# Patient Record
Sex: Male | Born: 1960 | Race: White | Hispanic: No | Smoking: Current every day smoker
Health system: Southern US, Community
[De-identification: ages and names within clinical notes are randomized; demographics above are authoritative.]

## PROBLEM LIST (undated history)

## (undated) DIAGNOSIS — E041 Nontoxic single thyroid nodule: Secondary | ICD-10-CM

## (undated) DIAGNOSIS — E559 Vitamin D deficiency, unspecified: Secondary | ICD-10-CM

## (undated) DIAGNOSIS — G4700 Insomnia, unspecified: Secondary | ICD-10-CM

## (undated) DIAGNOSIS — F419 Anxiety disorder, unspecified: Secondary | ICD-10-CM

## (undated) DIAGNOSIS — I10 Essential (primary) hypertension: Secondary | ICD-10-CM

## (undated) DIAGNOSIS — E8801 Alpha-1-antitrypsin deficiency: Secondary | ICD-10-CM

## (undated) DIAGNOSIS — Z720 Tobacco use: Secondary | ICD-10-CM

## (undated) DIAGNOSIS — F329 Major depressive disorder, single episode, unspecified: Secondary | ICD-10-CM

## (undated) DIAGNOSIS — F410 Panic disorder [episodic paroxysmal anxiety] without agoraphobia: Secondary | ICD-10-CM

## (undated) DIAGNOSIS — J449 Chronic obstructive pulmonary disease, unspecified: Secondary | ICD-10-CM

## (undated) DIAGNOSIS — M26609 Unspecified temporomandibular joint disorder, unspecified side: Secondary | ICD-10-CM

## (undated) DIAGNOSIS — E049 Nontoxic goiter, unspecified: Secondary | ICD-10-CM

## (undated) DIAGNOSIS — M545 Low back pain, unspecified: Secondary | ICD-10-CM

## (undated) DIAGNOSIS — M109 Gout, unspecified: Secondary | ICD-10-CM

## (undated) DIAGNOSIS — E039 Hypothyroidism, unspecified: Secondary | ICD-10-CM

## (undated) DIAGNOSIS — F32A Depression, unspecified: Secondary | ICD-10-CM

## (undated) DIAGNOSIS — M1990 Unspecified osteoarthritis, unspecified site: Secondary | ICD-10-CM

## (undated) DIAGNOSIS — E785 Hyperlipidemia, unspecified: Secondary | ICD-10-CM

## (undated) DIAGNOSIS — G8929 Other chronic pain: Secondary | ICD-10-CM

## (undated) HISTORY — DX: Low back pain, unspecified: M54.50

## (undated) HISTORY — DX: Essential (primary) hypertension: I10

## (undated) HISTORY — DX: Unspecified temporomandibular joint disorder, unspecified side: M26.609

## (undated) HISTORY — DX: Vitamin D deficiency, unspecified: E55.9

## (undated) HISTORY — DX: Other chronic pain: G89.29

## (undated) HISTORY — DX: Depression, unspecified: F32.A

## (undated) HISTORY — DX: Major depressive disorder, single episode, unspecified: F32.9

## (undated) HISTORY — DX: Insomnia, unspecified: G47.00

## (undated) HISTORY — DX: Gout, unspecified: M10.9

## (undated) HISTORY — DX: Nontoxic single thyroid nodule: E04.1

## (undated) HISTORY — DX: Nontoxic goiter, unspecified: E04.9

## (undated) HISTORY — DX: Chronic obstructive pulmonary disease, unspecified: J44.9

## (undated) HISTORY — DX: Hypothyroidism, unspecified: E03.9

## (undated) HISTORY — DX: Anxiety disorder, unspecified: F41.9

## (undated) HISTORY — DX: Alpha-1-antitrypsin deficiency: E88.01

## (undated) HISTORY — DX: Hyperlipidemia, unspecified: E78.5

## (undated) HISTORY — DX: Panic disorder (episodic paroxysmal anxiety): F41.0

## (undated) HISTORY — DX: Tobacco use: Z72.0

## (undated) HISTORY — DX: Low back pain: M54.5

---

## 2008-02-21 ENCOUNTER — Ambulatory Visit: Payer: Self-pay

## 2008-03-20 ENCOUNTER — Emergency Department: Payer: Self-pay | Admitting: Emergency Medicine

## 2008-04-03 ENCOUNTER — Ambulatory Visit: Payer: Self-pay | Admitting: Unknown Physician Specialty

## 2008-04-09 ENCOUNTER — Ambulatory Visit: Payer: Self-pay | Admitting: Unknown Physician Specialty

## 2011-01-06 HISTORY — PX: COLONOSCOPY: SHX174

## 2011-08-25 ENCOUNTER — Ambulatory Visit: Payer: Self-pay | Admitting: Unknown Physician Specialty

## 2013-05-11 ENCOUNTER — Ambulatory Visit: Payer: Self-pay | Admitting: Family Medicine

## 2013-05-19 ENCOUNTER — Ambulatory Visit: Payer: Self-pay | Admitting: Family Medicine

## 2013-05-25 ENCOUNTER — Encounter: Payer: Self-pay | Admitting: General Surgery

## 2013-06-19 ENCOUNTER — Ambulatory Visit: Payer: Self-pay | Admitting: General Surgery

## 2013-06-19 ENCOUNTER — Encounter: Payer: Self-pay | Admitting: General Surgery

## 2013-06-22 ENCOUNTER — Encounter: Payer: Self-pay | Admitting: *Deleted

## 2013-06-27 NOTE — Progress Notes (Signed)
This encounter was created in error - please disregard.

## 2013-11-13 ENCOUNTER — Ambulatory Visit: Payer: Self-pay | Admitting: Internal Medicine

## 2013-11-13 LAB — CBC CANCER CENTER
Basophil: 1 %
COMMENT - H1-COM1: NORMAL
COMMENT - H1-COM2: NORMAL
Eosinophil: 4 %
HCT: 55.7 % — ABNORMAL HIGH (ref 40.0–52.0)
HGB: 18.3 g/dL — ABNORMAL HIGH (ref 13.0–18.0)
Lymphocytes: 26 %
MCH: 29.5 pg (ref 26.0–34.0)
MCHC: 32.9 g/dL (ref 32.0–36.0)
MCV: 90 fL (ref 80–100)
MONOS PCT: 4 %
Platelet: 346 x10 3/mm (ref 150–440)
RBC: 6.22 10*6/uL — AB (ref 4.40–5.90)
RDW: 14.3 % (ref 11.5–14.5)
Segmented Neutrophils: 61 %
Variant Lymphocyte: 4 %
WBC: 10.8 x10 3/mm — AB (ref 3.8–10.6)

## 2013-11-13 LAB — IRON AND TIBC
IRON SATURATION: 20 %
Iron Bind.Cap.(Total): 367 ug/dL (ref 250–450)
Iron: 73 ug/dL (ref 65–175)
Unbound Iron-Bind.Cap.: 294 ug/dL

## 2013-11-21 LAB — CANCER CENTER HEMATOCRIT: HCT: 48.2 % (ref 40.0–52.0)

## 2013-12-05 ENCOUNTER — Ambulatory Visit: Payer: Self-pay | Admitting: Internal Medicine

## 2013-12-19 LAB — CANCER CENTER HEMATOCRIT: HCT: 50 % (ref 40.0–52.0)

## 2014-01-05 ENCOUNTER — Ambulatory Visit: Payer: Self-pay | Admitting: Internal Medicine

## 2014-02-13 ENCOUNTER — Ambulatory Visit: Payer: Self-pay | Admitting: Internal Medicine

## 2014-03-06 ENCOUNTER — Ambulatory Visit
Admit: 2014-03-06 | Disposition: A | Discharge status: Home / Self Care | Payer: Self-pay | Attending: Internal Medicine | Admitting: Internal Medicine

## 2014-04-06 ENCOUNTER — Ambulatory Visit
Admit: 2014-04-06 | Disposition: A | Discharge status: Home / Self Care | Payer: Self-pay | Attending: Internal Medicine | Admitting: Internal Medicine

## 2014-04-10 LAB — CANCER CENTER HEMATOCRIT: HCT: 48.1 % (ref 40.0–52.0)

## 2014-05-07 ENCOUNTER — Other Ambulatory Visit: Payer: Self-pay | Admitting: *Deleted

## 2014-05-07 ENCOUNTER — Other Ambulatory Visit: Payer: Self-pay

## 2014-05-07 DIAGNOSIS — D751 Secondary polycythemia: Secondary | ICD-10-CM

## 2014-05-08 ENCOUNTER — Ambulatory Visit: Payer: Medicaid Other

## 2014-05-08 ENCOUNTER — Inpatient Hospital Stay: Payer: Medicaid Other | Attending: Internal Medicine

## 2014-05-08 ENCOUNTER — Encounter (INDEPENDENT_AMBULATORY_CARE_PROVIDER_SITE_OTHER): Payer: Self-pay

## 2014-05-08 ENCOUNTER — Inpatient Hospital Stay (HOSPITAL_BASED_OUTPATIENT_CLINIC_OR_DEPARTMENT_OTHER): Payer: Medicaid Other | Admitting: Internal Medicine

## 2014-05-08 VITALS — BP 125/78 | HR 63 | Temp 97.3°F | Resp 18 | Ht 71.0 in | Wt 202.6 lb

## 2014-05-08 VITALS — BP 127/76 | HR 64 | Temp 97.0°F | Resp 18

## 2014-05-08 DIAGNOSIS — E039 Hypothyroidism, unspecified: Secondary | ICD-10-CM

## 2014-05-08 DIAGNOSIS — Z79899 Other long term (current) drug therapy: Secondary | ICD-10-CM | POA: Diagnosis not present

## 2014-05-08 DIAGNOSIS — E785 Hyperlipidemia, unspecified: Secondary | ICD-10-CM

## 2014-05-08 DIAGNOSIS — F418 Other specified anxiety disorders: Secondary | ICD-10-CM

## 2014-05-08 DIAGNOSIS — K219 Gastro-esophageal reflux disease without esophagitis: Secondary | ICD-10-CM

## 2014-05-08 DIAGNOSIS — D72829 Elevated white blood cell count, unspecified: Secondary | ICD-10-CM | POA: Insufficient documentation

## 2014-05-08 DIAGNOSIS — J449 Chronic obstructive pulmonary disease, unspecified: Secondary | ICD-10-CM | POA: Insufficient documentation

## 2014-05-08 DIAGNOSIS — M545 Low back pain: Secondary | ICD-10-CM | POA: Diagnosis not present

## 2014-05-08 DIAGNOSIS — G8929 Other chronic pain: Secondary | ICD-10-CM | POA: Insufficient documentation

## 2014-05-08 DIAGNOSIS — I10 Essential (primary) hypertension: Secondary | ICD-10-CM | POA: Diagnosis not present

## 2014-05-08 DIAGNOSIS — D45 Polycythemia vera: Secondary | ICD-10-CM | POA: Diagnosis not present

## 2014-05-08 DIAGNOSIS — D751 Secondary polycythemia: Secondary | ICD-10-CM

## 2014-05-08 DIAGNOSIS — F17210 Nicotine dependence, cigarettes, uncomplicated: Secondary | ICD-10-CM

## 2014-05-08 LAB — CBC WITH DIFFERENTIAL/PLATELET
BASOS ABS: 0 10*3/uL (ref 0–0.1)
Basophils Relative: 0 %
Eosinophils Absolute: 0.3 10*3/uL (ref 0–0.7)
HCT: 48.8 % (ref 40.0–52.0)
Hemoglobin: 16.3 g/dL (ref 13.0–18.0)
LYMPHS ABS: 3.4 10*3/uL (ref 1.0–3.6)
MCH: 28.2 pg (ref 26.0–34.0)
MCHC: 33.4 g/dL (ref 32.0–36.0)
MCV: 84.3 fL (ref 80.0–100.0)
Monocytes Absolute: 0.9 10*3/uL (ref 0.2–1.0)
Monocytes Relative: 7 %
NEUTROS ABS: 7.8 10*3/uL — AB (ref 1.4–6.5)
PLATELETS: 335 10*3/uL (ref 150–440)
RBC: 5.79 MIL/uL (ref 4.40–5.90)
RDW: 14.5 % (ref 11.5–14.5)
WBC: 12.4 10*3/uL — AB (ref 3.8–10.6)

## 2014-05-08 NOTE — Progress Notes (Signed)
Tyler Sox Sr. presents today for phlebotomy per MD orders. Phlebotomy procedure started at 1700 and ended at 1715 300 mls removed. Patient observed for 30 minutes after procedure without any incident. Patient tolerated procedure well. IV needle removed intact.

## 2014-05-29 NOTE — Progress Notes (Signed)
[place]  [phone] [phone]     ID: Tyler Sox Sr. OB: 05-24-1960  MR#: [id number]  TDV#:[id number]  Patient Care Team: Arnetha Courser, MD as PCP - General (Family Medicine)  CHIEF COMPLAINT/DIAGNOSIS:  Persistent Erythrocytosis - likely secondary to history of smoking and COPD Leukocytosis - likely also secondary to smoking history.  Workup done on 11/13/13 -   WBC 10800, 61% neutrophils, 26% lymphs, platelets 346, Hb 18.3, Hct 55.7. Carboxyhemoglobin 5.4%. Serum erythropoietin 9.0.  LAP score 99.  Otherwise BCR-ABL study, peripheral blood flow cytometry, iron study all unremarkable.  Started phlebotomy on 11/15/13.  HISTORY OF PRESENT ILLNESS:  patient returns for continued hematology follow-up, he was seen few months ago in nov 2015. Hematocrit on November 9 was significantly elevated at 55.7% and he got phlebotomy done, since then it is under better control. He has chronic dyspnea on exertion which is mild and chronic cough from COPD and smoking. He currently denies any wheezing, dyspnea at rest, orthopnea, or PND. No angina or palpitation. Denies any facial flushing or recurrent/severe headaches. Denies any prior history of thromboembolic phenomena including deep venous thrombosis, pulmonary embolism, TIA, stroke, or heart attack. No fevers or night sweats.   REVIEW OF SYSTEMS:   ROS As in HPI above. In addition, no fever, chills or sweats. No new headaches or focal weakness.  No new mood disturbances. No  sore throat, cough, shortness of breath, sputum, hemoptysis or chest pain. No dizziness or palpitation. No abdominal pain, constipation, diarrhea, dysuria or hematuria. No new skin rash or bleeding symptoms. No new paresthesias in extremities.  Otherwise, a complete review of systems is negative.  PAST MEDICAL HISTORY:  Past Medical History/Past Surgical History -  Hypertension  Hyperlipidemia  COPD  Alpha-1 antitrypsin  deficiency  Chronic low back pain  Gout  Hypothyroidism  Goiter, 2 cysts on the thyroid  GERD  Depression/anxiety disorder  Temporomandibular joint disorder  History of colon polyps (polypectomy 1 in 2010, repeat colonoscopy 2013 reportedly with no polyps)   PAST SURGICAL HISTORY: as above  FAMILY HISTORY - noncontributory, denies hematologic disorders or malignancy.   SOCIAL HISTORY: History  Substance Use Topics  . Smoking status: Never Smoker   . Smokeless tobacco: Never Used  . Alcohol Use: No  Chronic smoker, 1 pack per day 39 years. Denies alcohol or recreational drug usage. Physically active and ambulatory.   Allergies  Allergen Reactions  . No Known Allergies     Current Outpatient Prescriptions  Medication Sig Dispense Refill  . clonazePAM (KLONOPIN) 2 MG tablet Take 2 mg by mouth daily.    Marland Kitchen lisinopril (PRINIVIL,ZESTRIL) 20 MG tablet Take 20 mg by mouth daily.    Marland Kitchen PARoxetine (PAXIL) 20 MG tablet Take 20 mg by mouth 2 (two) times daily.    . simvastatin (ZOCOR) 20 MG tablet Take 20 mg by mouth daily.     No current facility-administered medications for this visit.    OBJECTIVE: Filed Vitals:   05/08/14 1528  BP: 125/78  Pulse: 63  Temp: 97.3 F (36.3 C)  Resp: 18     Body mass index is 28.27 kg/(m^2).     GENERAL: Alert and oriented and in no acute distress. There is no icterus. HEENT: EOMs intact. No cervical lymphadenopathy. CVS: S1S2, regular LUNGS: Bilaterally clear to auscultation, no rhonchi. ABDOMEN: Soft, nontender. No hepatosplenomegaly clinically.  NEURO: grossly nonfocal, cranial nerves are intact. Gait unremarkable. EXTREMITIES: No pedal edema.  LAB RESULTS: Lab  Results  Component Value Date   WBC 12.4* 05/08/2014   NEUTROABS 7.8* 05/08/2014   HGB 16.3 05/08/2014   HCT 48.8 05/08/2014   MCV 84.3 05/08/2014   PLT 335 05/08/2014   10/12/13 - WBC 11300, 68% neutrophils, ANC 7700, 23% lymphocytes, ALC 2600, 6% monos, AMC 600, 2%  eosinophils, 1% basophils, Hb 17.1, hematocrit 50.3%, platelets 307, Cr 1.11, LFT unremarkable.  07/03/13 - WBC elevated at 10900, Hct 52.3%, Hb 17.8, platelets 375, ANC 7400. In May 2015 - WBC 9900 with unremarkable differential,Hct elevated at 51.4%, Hb 17.4, platelets 344.  ASSESSMENT / PLAN:   Persistent unexplained Leukocytosis, Erythrocytosis    (labs done on 10/12/13 showed WBC 11300, 68% neutrophils, ANC 7700, 23% lymphocytes, ALC 2600, 6% monos, AMC 600, 2% eosinophils, 1% basophils, Hb 17.1, hematocrit 50.3%, platelets 307, Cr 1.11, LFT unremarkable. Prior to this on 07/03/13, WBC elevated at 10900, Hct 52.3%, Hb 17.8, platelets 375, ANC 7400. In May 2015, WBC 9900 with unremarkable differential,Hct elevated at 51.4%, Hb 17.4, platelets 344)    -   Reviewed labs form today and d/w patient. He is clinically doing study, hematocrit today is better at 48.8%. Patient explained about likely etiology for elevated blood counts being from ongoing chronic smoking history, along with underlying COPD which could contribute to causing erythrocytosis. Plan is to continue to monitor hematocrit once every 4 weeks and pursue phlebotomy 300 mL if it is 48 or higher. Patient strongly advised to completely quit smoking and explained various risks and complications associated with smoking, states that he will try to do so on his own and does not want to try patches or medication at this time, have given smoking cessation information and advised him to call [facility] also. Will get CBC/differential at 12 weeks to monitor leukocytosis. Next MD follow-up at 24 weeks with repeat labs and make further plan of management.     In between visits, the patient has been advised to call or come to the ER in case of fevers, chills, bleeding, acute sickness, or new symptoms. Patient is agreeable to this plan.   Leia Alf, MD   05/29/2014 11:14 PM

## 2014-06-05 ENCOUNTER — Inpatient Hospital Stay: Payer: Medicaid Other

## 2014-06-07 ENCOUNTER — Other Ambulatory Visit: Payer: Self-pay | Admitting: Family Medicine

## 2014-06-07 DIAGNOSIS — E0590 Thyrotoxicosis, unspecified without thyrotoxic crisis or storm: Secondary | ICD-10-CM

## 2014-06-12 ENCOUNTER — Inpatient Hospital Stay: Payer: Medicaid Other

## 2014-06-12 ENCOUNTER — Inpatient Hospital Stay: Payer: Medicaid Other | Attending: Internal Medicine

## 2014-06-12 ENCOUNTER — Encounter (INDEPENDENT_AMBULATORY_CARE_PROVIDER_SITE_OTHER): Payer: Self-pay

## 2014-06-12 VITALS — BP 130/80 | HR 68 | Temp 97.0°F | Resp 18

## 2014-06-12 DIAGNOSIS — F17210 Nicotine dependence, cigarettes, uncomplicated: Secondary | ICD-10-CM | POA: Insufficient documentation

## 2014-06-12 DIAGNOSIS — D751 Secondary polycythemia: Secondary | ICD-10-CM

## 2014-06-12 DIAGNOSIS — J449 Chronic obstructive pulmonary disease, unspecified: Secondary | ICD-10-CM | POA: Diagnosis not present

## 2014-06-12 LAB — HEMATOCRIT: HEMATOCRIT: 49 % (ref 40.0–52.0)

## 2014-06-14 ENCOUNTER — Other Ambulatory Visit: Payer: Self-pay

## 2014-06-14 ENCOUNTER — Ambulatory Visit
Admission: RE | Admit: 2014-06-14 | Discharge: 2014-06-14 | Disposition: A | Discharge status: Home / Self Care | Payer: Medicaid Other | Source: Ambulatory Visit | Attending: Family Medicine | Admitting: Family Medicine

## 2014-06-14 ENCOUNTER — Encounter: Payer: Self-pay | Admitting: Family Medicine

## 2014-06-14 DIAGNOSIS — R221 Localized swelling, mass and lump, neck: Secondary | ICD-10-CM

## 2014-06-14 DIAGNOSIS — R591 Generalized enlarged lymph nodes: Secondary | ICD-10-CM | POA: Diagnosis not present

## 2014-06-14 DIAGNOSIS — E0590 Thyrotoxicosis, unspecified without thyrotoxic crisis or storm: Secondary | ICD-10-CM

## 2014-06-14 MED ORDER — SIMVASTATIN 20 MG PO TABS: 20.0000 mg | ORAL_TABLET | Freq: Every day | ORAL | Status: DC

## 2014-06-14 NOTE — Patient Instructions (Signed)
I talked with patient by phone about scan results; mass left side neck, nonspecific; options include FNA or f/u US; let's get FNA; no thyroid findings but do continue medicine; referral to ENT entered

## 2014-07-24 ENCOUNTER — Inpatient Hospital Stay: Payer: Medicaid Other

## 2014-07-24 ENCOUNTER — Inpatient Hospital Stay: Payer: Medicaid Other | Attending: Internal Medicine

## 2014-07-24 DIAGNOSIS — D751 Secondary polycythemia: Secondary | ICD-10-CM | POA: Diagnosis not present

## 2014-07-24 LAB — CBC WITH DIFFERENTIAL/PLATELET
BASOS ABS: 0 10*3/uL (ref 0–0.1)
Basophils Relative: 0 %
Eosinophils Absolute: 0.4 10*3/uL (ref 0–0.7)
Eosinophils Relative: 3 %
HEMATOCRIT: 48.6 % (ref 40.0–52.0)
HEMOGLOBIN: 15.8 g/dL (ref 13.0–18.0)
Lymphocytes Relative: 30 %
Lymphs Abs: 3.2 10*3/uL (ref 1.0–3.6)
MCH: 27 pg (ref 26.0–34.0)
MCHC: 32.5 g/dL (ref 32.0–36.0)
MCV: 83 fL (ref 80.0–100.0)
MONOS PCT: 7 %
Monocytes Absolute: 0.8 10*3/uL (ref 0.2–1.0)
NEUTROS ABS: 6.3 10*3/uL (ref 1.4–6.5)
NEUTROS PCT: 60 %
PLATELETS: 377 10*3/uL (ref 150–440)
RBC: 5.85 MIL/uL (ref 4.40–5.90)
RDW: 14.9 % — AB (ref 11.5–14.5)
WBC: 10.6 10*3/uL (ref 3.8–10.6)

## 2014-08-21 ENCOUNTER — Inpatient Hospital Stay: Payer: Medicaid Other

## 2014-08-21 ENCOUNTER — Inpatient Hospital Stay: Payer: Medicaid Other | Attending: Internal Medicine

## 2014-08-21 VITALS — BP 136/79 | HR 60 | Temp 97.4°F

## 2014-08-21 DIAGNOSIS — F17210 Nicotine dependence, cigarettes, uncomplicated: Secondary | ICD-10-CM | POA: Insufficient documentation

## 2014-08-21 DIAGNOSIS — D72829 Elevated white blood cell count, unspecified: Secondary | ICD-10-CM | POA: Diagnosis not present

## 2014-08-21 DIAGNOSIS — D751 Secondary polycythemia: Secondary | ICD-10-CM | POA: Diagnosis present

## 2014-08-21 DIAGNOSIS — J449 Chronic obstructive pulmonary disease, unspecified: Secondary | ICD-10-CM | POA: Diagnosis not present

## 2014-08-21 LAB — HEMATOCRIT: HCT: 51.5 % (ref 40.0–52.0)

## 2014-08-24 ENCOUNTER — Telehealth: Payer: Self-pay | Admitting: Gastroenterology

## 2014-08-24 ENCOUNTER — Ambulatory Visit (INDEPENDENT_AMBULATORY_CARE_PROVIDER_SITE_OTHER): Payer: Medicaid Other | Admitting: Family Medicine

## 2014-08-24 ENCOUNTER — Encounter: Payer: Self-pay | Admitting: Family Medicine

## 2014-08-24 VITALS — BP 136/79 | HR 70 | Temp 98.2°F | Ht 69.0 in | Wt 201.0 lb

## 2014-08-24 DIAGNOSIS — E559 Vitamin D deficiency, unspecified: Secondary | ICD-10-CM | POA: Insufficient documentation

## 2014-08-24 DIAGNOSIS — E8801 Alpha-1-antitrypsin deficiency: Secondary | ICD-10-CM | POA: Insufficient documentation

## 2014-08-24 DIAGNOSIS — F410 Panic disorder [episodic paroxysmal anxiety] without agoraphobia: Secondary | ICD-10-CM | POA: Diagnosis not present

## 2014-08-24 DIAGNOSIS — E038 Other specified hypothyroidism: Secondary | ICD-10-CM

## 2014-08-24 DIAGNOSIS — K921 Melena: Secondary | ICD-10-CM | POA: Diagnosis not present

## 2014-08-24 DIAGNOSIS — G4700 Insomnia, unspecified: Secondary | ICD-10-CM | POA: Diagnosis not present

## 2014-08-24 DIAGNOSIS — F319 Bipolar disorder, unspecified: Secondary | ICD-10-CM | POA: Insufficient documentation

## 2014-08-24 DIAGNOSIS — F4001 Agoraphobia with panic disorder: Secondary | ICD-10-CM

## 2014-08-24 DIAGNOSIS — E785 Hyperlipidemia, unspecified: Secondary | ICD-10-CM

## 2014-08-24 DIAGNOSIS — F3132 Bipolar disorder, current episode depressed, moderate: Secondary | ICD-10-CM

## 2014-08-24 DIAGNOSIS — E041 Nontoxic single thyroid nodule: Secondary | ICD-10-CM | POA: Insufficient documentation

## 2014-08-24 DIAGNOSIS — F419 Anxiety disorder, unspecified: Secondary | ICD-10-CM | POA: Diagnosis not present

## 2014-08-24 DIAGNOSIS — Z720 Tobacco use: Secondary | ICD-10-CM | POA: Diagnosis not present

## 2014-08-24 DIAGNOSIS — R0683 Snoring: Secondary | ICD-10-CM | POA: Insufficient documentation

## 2014-08-24 DIAGNOSIS — I10 Essential (primary) hypertension: Secondary | ICD-10-CM | POA: Insufficient documentation

## 2014-08-24 DIAGNOSIS — D751 Secondary polycythemia: Secondary | ICD-10-CM | POA: Diagnosis not present

## 2014-08-24 DIAGNOSIS — E039 Hypothyroidism, unspecified: Secondary | ICD-10-CM | POA: Insufficient documentation

## 2014-08-24 MED ORDER — PAROXETINE HCL 20 MG PO TABS: 20.0000 mg | ORAL_TABLET | Freq: Two times a day (BID) | ORAL | Status: DC

## 2014-08-24 MED ORDER — CLONAZEPAM 0.5 MG PO TABS: 0.5000 mg | ORAL_TABLET | Freq: Every day | ORAL | Status: DC

## 2014-08-24 NOTE — Telephone Encounter (Signed)
Patient called back. He has decided he does not want to make an appointment at this time. He states he has had two colonoscopies and that the blood in his stool is just hemorrhoids and he is fine at this time. If it becomes bothersome he will call back to schedule an appointment with Dr Allen Norris.

## 2014-08-24 NOTE — Assessment & Plan Note (Signed)
Not seeing a counselor; refer to psychiatrist; continue ssri; I will not continue the benzo

## 2014-08-24 NOTE — Assessment & Plan Note (Addendum)
Continue statin; limit eggs to no more than 3 per week and try to cut down on fatty processed meats; last lipid panel was May; HDL 30; encouraged activity, walking more; smoking cessation will help raise HDL

## 2014-08-24 NOTE — Assessment & Plan Note (Signed)
Refer to psychiatrist for this as well; explained that I am worried about him mixing benzo plus hydrocodone, risk of unintentional overdose

## 2014-08-24 NOTE — Assessment & Plan Note (Signed)
Followed by hematologist; will get sleep study to make sure that OSA is not contributing

## 2014-08-24 NOTE — Assessment & Plan Note (Signed)
Last TSH in [facility]

## 2014-08-24 NOTE — Assessment & Plan Note (Signed)
He is not quite ready to quit, but I am here to help when he is ready; cold [place] is how he will quit he says

## 2014-08-24 NOTE — Assessment & Plan Note (Addendum)
Refer back to psychiatrist for evaluation, diagnosis; patient does not think he has bipolar disorder but has seen psychiatrists for years; will refer to them for evaluation and medical management

## 2014-08-24 NOTE — Telephone Encounter (Signed)
I have called pt to make an appointment per referral--blood in stool. Pt is not available. I have left a message with a male for pt to call back. Will follow up.

## 2014-08-24 NOTE — Patient Instructions (Signed)
Please only take the medicines exactly as prescribed Never mix pain medicines with anxiety medicines in the future (because I care and don't want anything to happen to you) We'll refer you to a new psychiatrist We'll have you see the colon doctor to see if colonoscopy indicated Limit eggs to no more than 3 per week and cut down on fatty processed food Do try to walk more and get more activity, build up gradually and slowly We'll have you get a sleep study Really think about giving up those cigarettes

## 2014-08-24 NOTE — Progress Notes (Signed)
BP 136/79 mmHg  Pulse 70  Temp(Src) 98.2 F (36.8 C)  Ht 5\' 9"  (1.753 m)  Wt 201 lb (91.173 kg)  BMI 29.67 kg/m2  SpO2 98%   Subjective:    Patient ID: Tyler Sox Sr., male    DOB: October 21, 1960, 54 y.o.   MRN: [id number]  HPI: Tyler Cobb. is a 54 y.o. male  Chief Complaint  Patient presents with  . Hypertension  . Hyperlipidemia  . Anxiety   He says he says he needs to see a psychiatrist; a little depression about sleep, anxiety; anxiety is pretty bad; he has been taking two of his clonazepam at night (he is only instructed to take one at night), so he says he's been cheating; it helps him sleep; it does not make him feel drunk or loopy; it makes him feel better during the day about things; ran out completely about 5-6 days ago; feeling a little jittery, kind of irritable, snappy; prescription filled on 08/02/2014; no SI, no HI; needs refill of paxil He has a controlled substance contract with [place]; I checked the [facility] and he had hydrocodone filled from a dentist in June; he says he forgot to call me; dental abscess, got tooth pulled and headaches have gone away; he did not realize there would be a problem; he does not have any more hydrocodone left over  He has thick blood, last was a 51; he read that sleep apnea could be causing that; he [facility] the doctor [facility], was at hematologist two days ago; they are pulling blood off occasionally, 350 ml once a month or so; the doctor told him about getting a chest xray  He has high blood pressure; fair controlled; he does check his BP at home but not sure if accurate; top number 130s and 140s; bottom is okay  Hypothyroidism; primary; he is taking thyroid medicine daily; last labs were just in May and all reviewed  High cholesterol; on statin; eats about 6 eggs a week; eats processed fatty meats; not much cheese; no milk; last HDL was 30 in May  Smoking; 1 ppd; thinks about quitting sometimes, thinks he'll have  to quit cold [place]; not yet ready to give me a date to quit, just thinking about it  Relevant past medical, surgical, family and social history reviewed and updated as indicated. Interim medical history since our last visit reviewed. Allergies and medications reviewed and updated.  Review of Systems  Constitutional: Negative for fever and unexpected weight change.  HENT: Negative for dental problem (teeth okay after abscess and tooth extraction in June).   Respiratory: Positive for wheezing (does wheeze sometimes, comes from smoking).   Cardiovascular: Negative for leg swelling.  Gastrointestinal: Positive for blood in stool (this morning, thinks it is coming from sitting on concrete).  Hematological: Does not bruise/bleed easily.  Psychiatric/Behavioral: Positive for confusion (when he types of computer, has trouble staying on what he's doing), sleep disturbance, dysphoric mood and decreased concentration. Negative for suicidal ideas, hallucinations and self-injury. The patient is nervous/anxious.    Per HPI unless specifically indicated above     Objective:    BP 136/79 mmHg  Pulse 70  Temp(Src) 98.2 F (36.8 C)  Ht 5\' 9"  (1.753 m)  Wt 201 lb (91.173 kg)  BMI 29.67 kg/m2  SpO2 98%  Wt Readings from Last 3 Encounters:  08/24/14 201 lb (91.173 kg)  05/08/14 202 lb 9.6 oz (91.9 kg)  04/10/14 197 lb 1.5  oz (89.4 kg)    Physical Exam  Constitutional: He appears well-developed and well-nourished. No distress.  HENT:  Head: Normocephalic and atraumatic.  Eyes: EOM are normal. No scleral icterus.  Neck: No thyromegaly present.  Cardiovascular: Normal rate and regular rhythm.   Pulmonary/Chest: Effort normal and breath sounds normal.  Abdominal: Soft. Bowel sounds are normal. He exhibits no distension.  Musculoskeletal: He exhibits no edema.  Neurological: Coordination normal.  Skin: Skin is warm and dry. No pallor.  Psychiatric: Judgment and thought content normal. His mood  appears anxious. His affect is blunt. His affect is not angry, not labile and not inappropriate. His speech is not delayed, not tangential and not slurred. He is slowed and withdrawn. He is not agitated, not aggressive, not hyperactive and not combative. Thought content is not paranoid and not delusional. Cognition and memory are normal. Cognition and memory are not impaired. He does not express impulsivity or inappropriate judgment. He exhibits a depressed mood. He expresses no homicidal and no suicidal ideation.    Results for orders placed or performed in visit on 08/21/14  Hematocrit [facility])  Result Value Ref Range   HCT 51.5 40.0 - 52.0 %      Assessment & Plan:   Problem List Items Addressed This Visit      Endocrine   Hypothyroidism    Last TSH in [facility]      Relevant Medications   levothyroxine (SYNTHROID, LEVOTHROID) 100 MCG tablet     Other   Secondary erythrocytosis    Followed by hematologist; will get sleep study to make sure that OSA is not contributing      Relevant Orders   Polysomnography 4 or more parameters   Tobacco use    He is not quite ready to quit, but I am here to help when he is ready; cold [place] is how he will quit he says      Hyperlipidemia - Primary    Continue statin; limit eggs to no more than 3 per week and try to cut down on fatty processed meats; last lipid panel was May; HDL 30; encouraged activity, walking more; smoking cessation will help raise HDL      Relevant Medications   lisinopril-hydrochlorothiazide (PRINZIDE,ZESTORETIC) 20-12.5 MG per tablet   Anxiety    Refer to psychiatrist for this as well; explained that I am worried about him mixing benzo plus hydrocodone, risk of unintentional overdose      Relevant Medications   PARoxetine (PAXIL) 20 MG tablet   Other Relevant Orders   Ambulatory referral to Psychiatry   Bipolar disorder    Refer back to psychiatrist for evaluation, diagnosis; patient does not think he has  bipolar disorder but has seen psychiatrists for years; will refer to them for evaluation and medical management      Relevant Orders   Ambulatory referral to Psychiatry   Insomnia    Patient declined offer for trazodone;he increased his clonazepam, and I explained my concern; risk of unintentional overdose; he did take hydrocodone, but I do believe it was an honest mistake and was not from doctor shopping or trying to get high; explained so important to not mix medicine and he agrees to this in the future; he has chronic anxiety and insomnia, and he sounds like he is not going to sleep without something and is irritable and edgy; will give limited benzo, he promises he has no more pain medicine and will take exactly as prescribed; I will turn over  the decision as to whether or not to continue benzo to psychiatrist      Relevant Orders   Polysomnography 4 or more parameters   Panic attacks    Refer to psychiatrist      Relevant Medications   PARoxetine (PAXIL) 20 MG tablet   Blood in the stool    Refer to GI for consideration of colonoscopy; patient thinks hemorrhoids; last colonoscopy was 3 years ago      Relevant Orders   Ambulatory referral to Gastroenterology   Agoraphobia with panic attacks    Not seeing a counselor; refer to psychiatrist; continue ssri; I will not continue the benzo      Snoring    Will get sleep study to evaluate for OSA          Follow up plan: Return in about 3 months (around 11/24/2014) for multiple issues.

## 2014-08-24 NOTE — Assessment & Plan Note (Signed)
Refer to psychiatrist 

## 2014-08-24 NOTE — Assessment & Plan Note (Signed)
Will get sleep study to evaluate for OSA

## 2014-08-24 NOTE — Assessment & Plan Note (Signed)
Refer to GI for consideration of colonoscopy; patient thinks hemorrhoids; last colonoscopy was 3 years ago

## 2014-08-24 NOTE — Assessment & Plan Note (Addendum)
Patient declined offer for trazodone;he increased his clonazepam, and I explained my concern; risk of unintentional overdose; he did take hydrocodone, but I do believe it was an honest mistake and was not from doctor shopping or trying to get high; explained so important to not mix medicine and he agrees to this in the future; he has chronic anxiety and insomnia, and he sounds like he is not going to sleep without something and is irritable and edgy; will give limited benzo, he promises he has no more pain medicine and will take exactly as prescribed; I will turn over the decision as to whether or not to continue benzo to psychiatrist

## 2014-09-18 ENCOUNTER — Inpatient Hospital Stay: Payer: Medicaid Other

## 2014-09-18 ENCOUNTER — Inpatient Hospital Stay: Payer: Medicaid Other | Attending: Internal Medicine

## 2014-09-18 DIAGNOSIS — D751 Secondary polycythemia: Secondary | ICD-10-CM

## 2014-09-18 DIAGNOSIS — J449 Chronic obstructive pulmonary disease, unspecified: Secondary | ICD-10-CM | POA: Diagnosis not present

## 2014-09-18 DIAGNOSIS — F17210 Nicotine dependence, cigarettes, uncomplicated: Secondary | ICD-10-CM | POA: Insufficient documentation

## 2014-09-18 DIAGNOSIS — D72829 Elevated white blood cell count, unspecified: Secondary | ICD-10-CM | POA: Insufficient documentation

## 2014-09-18 LAB — HEMATOCRIT: HEMATOCRIT: 48.7 % (ref 40.0–52.0)

## 2014-09-19 ENCOUNTER — Other Ambulatory Visit: Payer: Self-pay | Admitting: Family Medicine

## 2014-09-19 NOTE — Telephone Encounter (Signed)
CMP from Jun 01, 2014 reviewed; Rx approved

## 2014-09-27 ENCOUNTER — Ambulatory Visit: Payer: Medicaid Other | Admitting: Gastroenterology

## 2014-10-08 ENCOUNTER — Other Ambulatory Visit: Payer: Self-pay | Admitting: Family Medicine

## 2014-10-08 NOTE — Telephone Encounter (Signed)
TSH 1.810 in May 2016 (old EMR) Last SGPT and lipids reviewed from May 2016 Rxs approved

## 2014-10-08 NOTE — Telephone Encounter (Signed)
TSH and lipids and sgpt reviewed in [facility] Rxs approved

## 2014-10-09 ENCOUNTER — Other Ambulatory Visit: Payer: Self-pay | Admitting: Family Medicine

## 2014-10-09 NOTE — Telephone Encounter (Signed)
I just approved refills yesterday; please resolve with pharmacy

## 2014-10-10 NOTE — Telephone Encounter (Signed)
It looks like I approved plenty just on October 3rd; please resolve with pharmacy

## 2014-10-10 NOTE — Telephone Encounter (Signed)
Routing to provider  

## 2014-10-16 ENCOUNTER — Inpatient Hospital Stay: Payer: Medicaid Other

## 2014-10-16 ENCOUNTER — Inpatient Hospital Stay: Payer: Medicaid Other | Attending: Internal Medicine

## 2014-10-16 DIAGNOSIS — D751 Secondary polycythemia: Secondary | ICD-10-CM | POA: Diagnosis present

## 2014-10-16 DIAGNOSIS — J449 Chronic obstructive pulmonary disease, unspecified: Secondary | ICD-10-CM | POA: Diagnosis not present

## 2014-10-16 DIAGNOSIS — D72829 Elevated white blood cell count, unspecified: Secondary | ICD-10-CM | POA: Insufficient documentation

## 2014-10-16 DIAGNOSIS — F17210 Nicotine dependence, cigarettes, uncomplicated: Secondary | ICD-10-CM | POA: Insufficient documentation

## 2014-10-16 LAB — HEMATOCRIT: HEMATOCRIT: 51.3 % (ref 40.0–52.0)

## 2014-11-07 IMAGING — CR DG CHEST 2V
1 series · 3 of 3 positions shown · non-contrast
Comparison: None.

CLINICAL DATA: Smoker, sweating, possible mass

EXAM:
CHEST  2 VIEW

[Series 1: lat · 0.17mm/px · 3 of 3 slices shown]
[im 1/3]
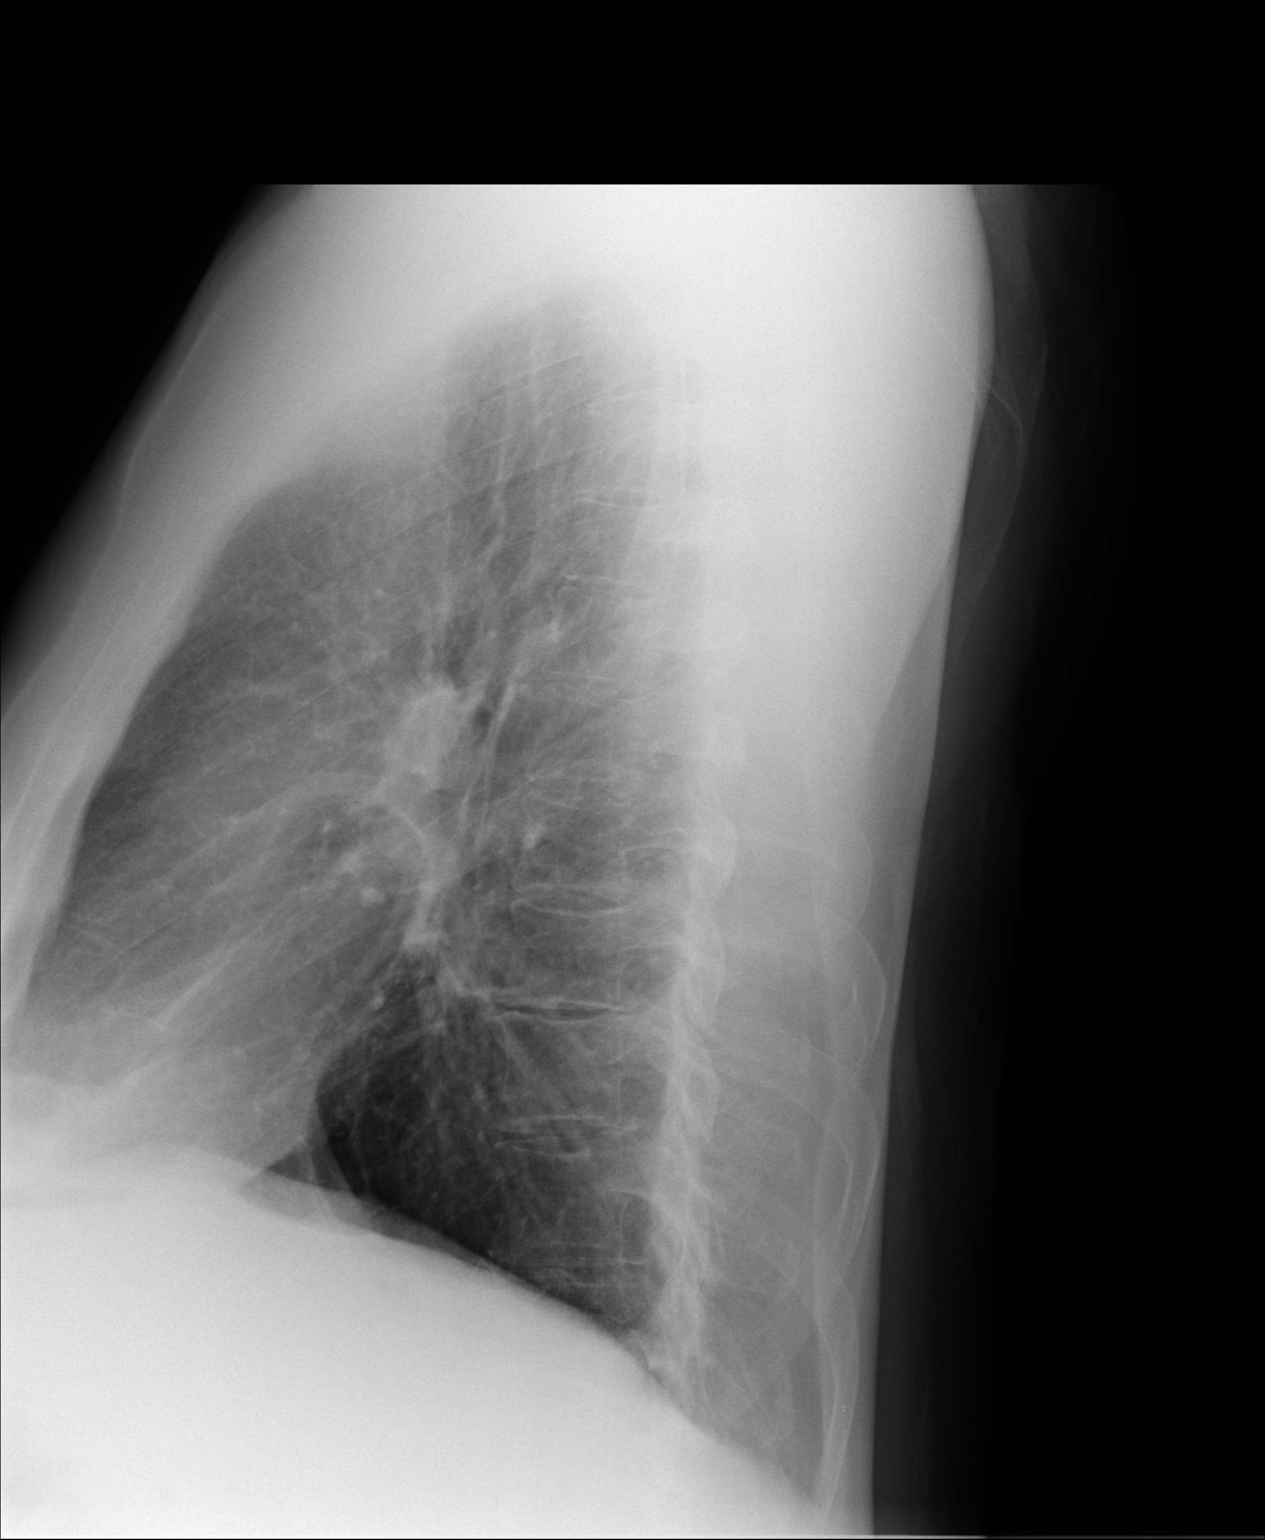
[im 2/3]
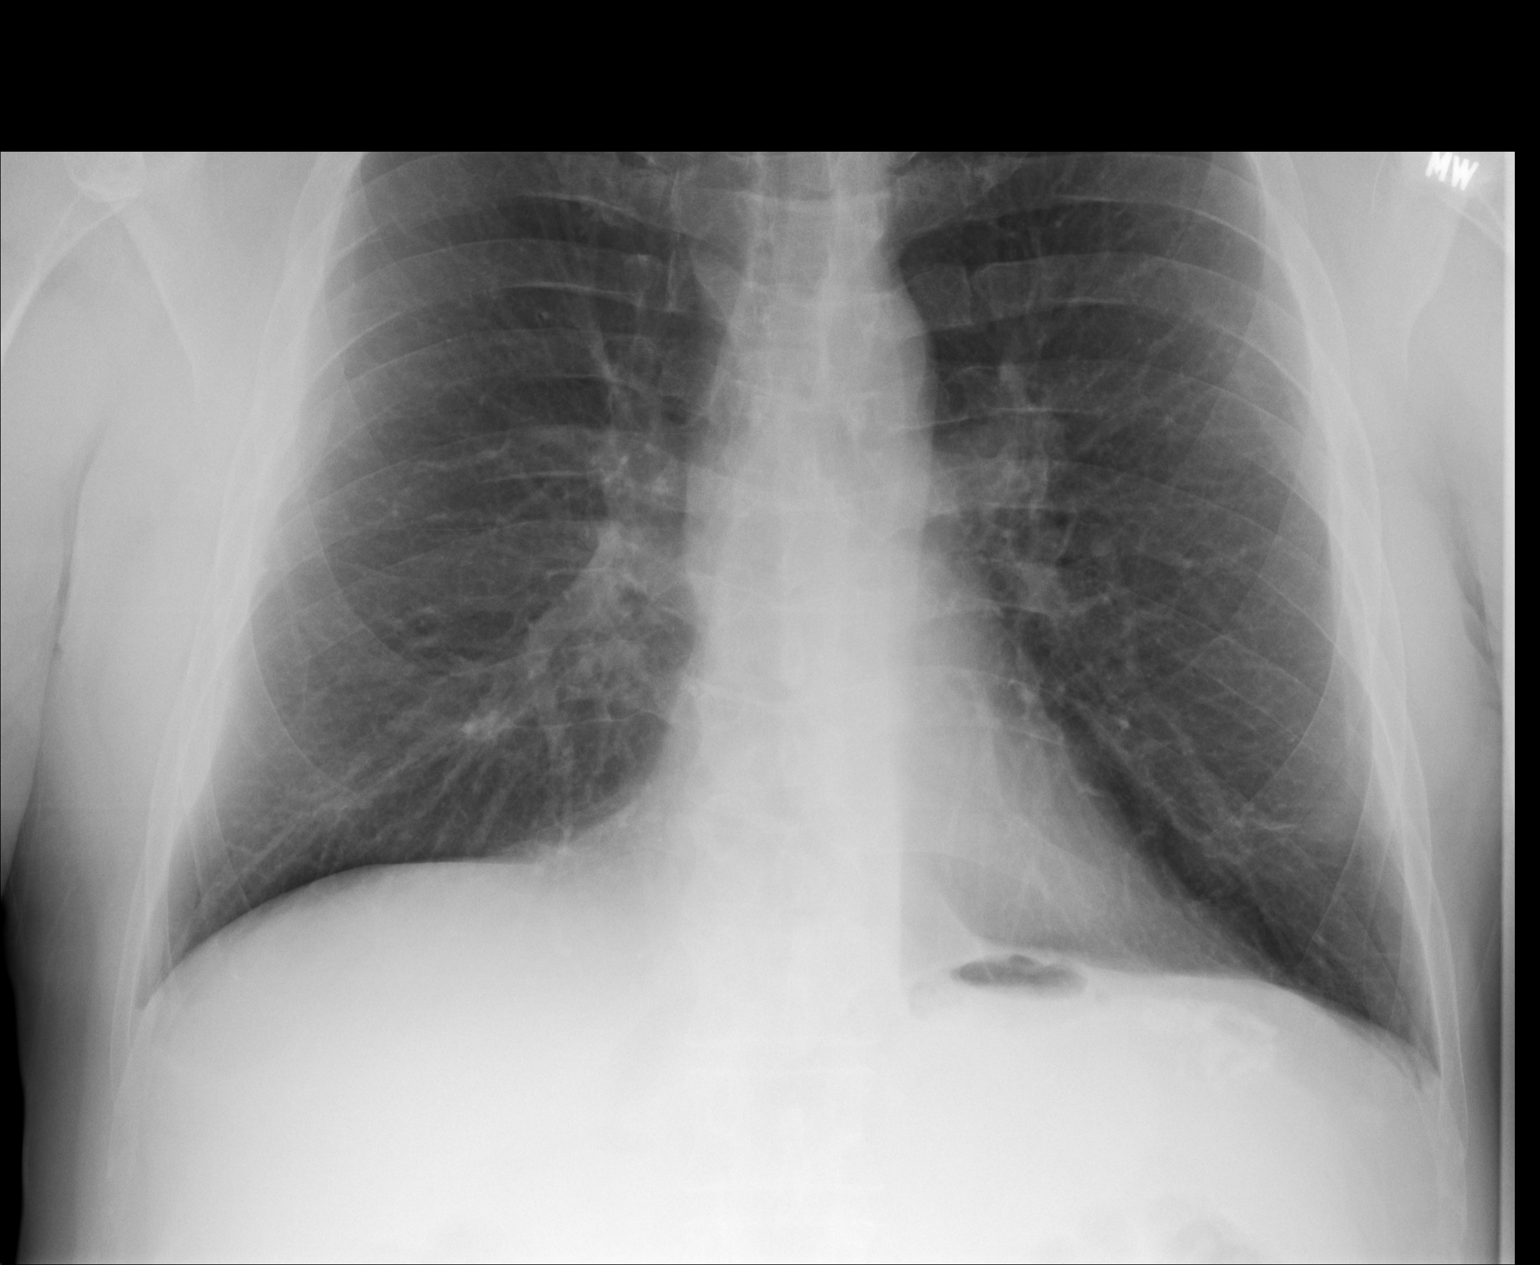
[im 3/3]
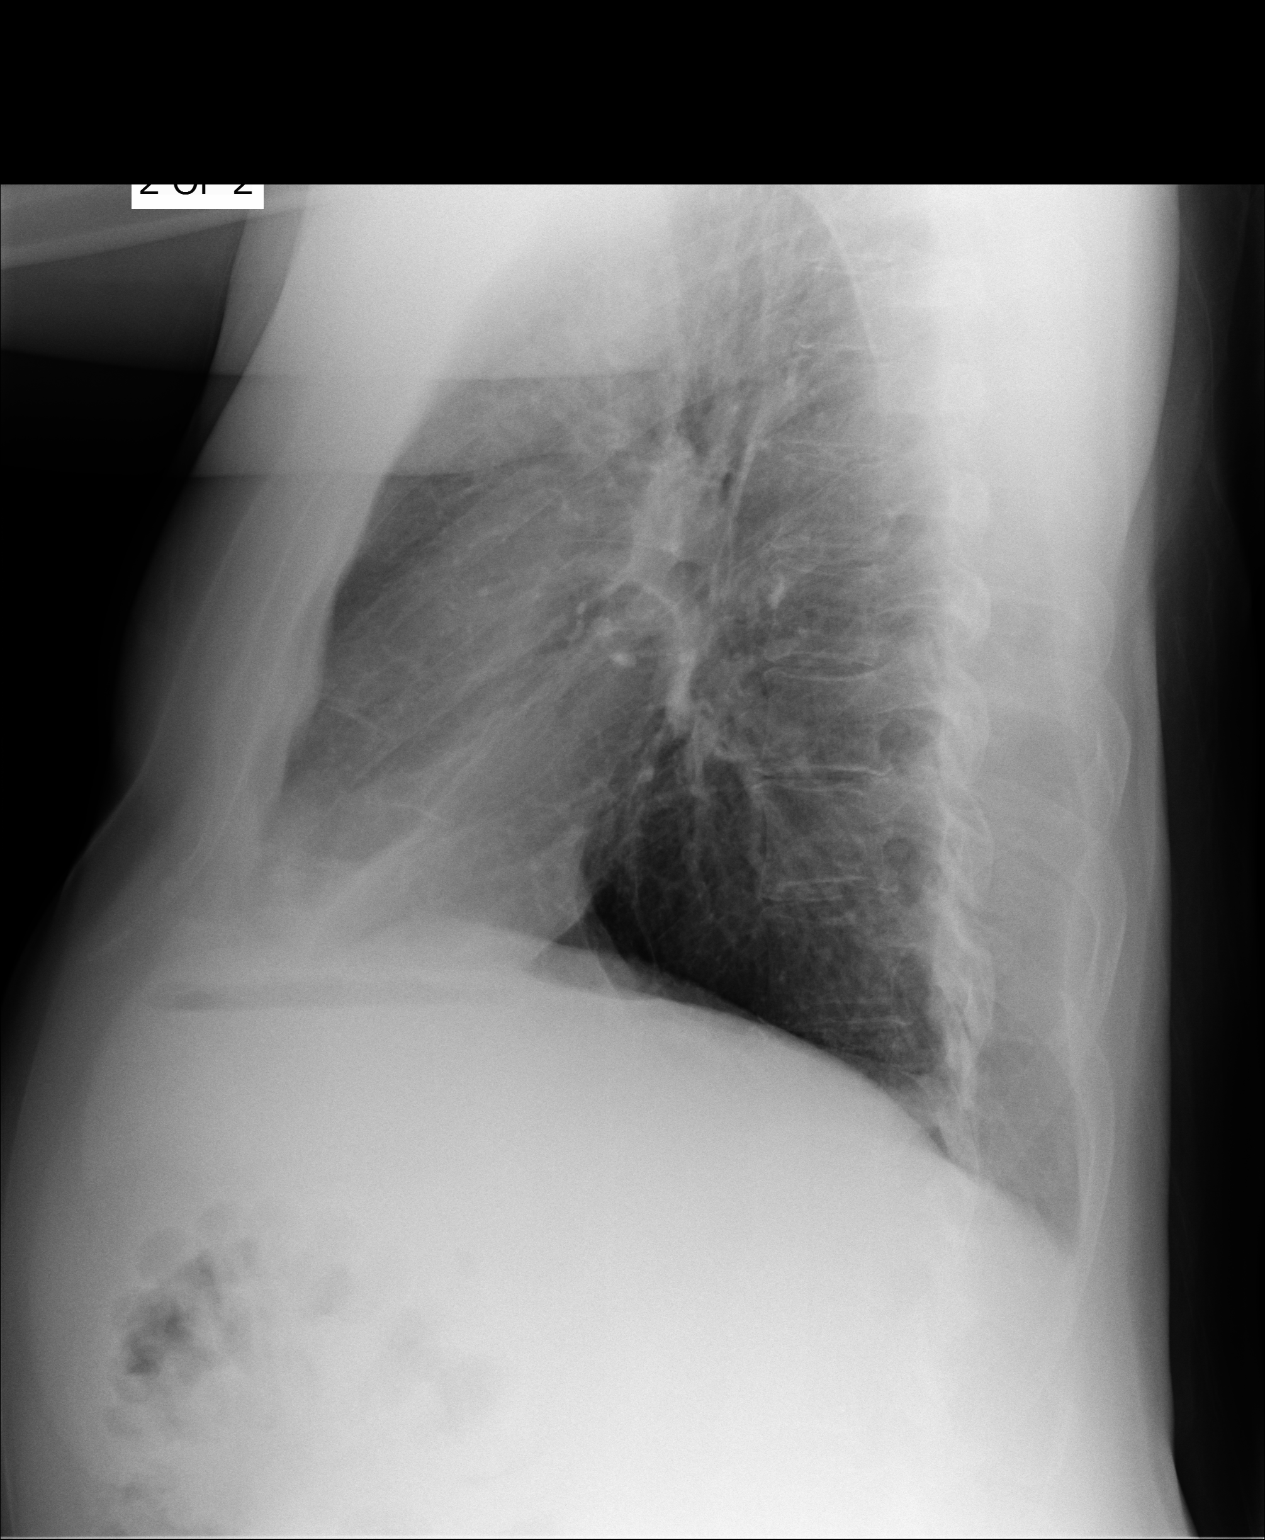

[3 of 3 positions shown; findings below may reference images not displayed]

FINDINGS: Cardiomediastinal silhouette is unremarkable. No acute infiltrate or
pleural effusion. No pulmonary edema. Mild hyperinflation. Central
mild bronchitic changes.
IMPRESSION: No acute infiltrate or pulmonary edema. Central mild bronchitic
changes. Mild hyperinflation.

## 2014-11-13 ENCOUNTER — Encounter: Payer: Self-pay | Admitting: Family Medicine

## 2014-11-13 ENCOUNTER — Inpatient Hospital Stay (HOSPITAL_BASED_OUTPATIENT_CLINIC_OR_DEPARTMENT_OTHER): Payer: Medicaid Other | Admitting: Family Medicine

## 2014-11-13 ENCOUNTER — Inpatient Hospital Stay: Payer: Medicaid Other

## 2014-11-13 ENCOUNTER — Inpatient Hospital Stay: Payer: Medicaid Other | Attending: Oncology

## 2014-11-13 VITALS — BP 131/78 | HR 71

## 2014-11-13 DIAGNOSIS — E785 Hyperlipidemia, unspecified: Secondary | ICD-10-CM

## 2014-11-13 DIAGNOSIS — I10 Essential (primary) hypertension: Secondary | ICD-10-CM | POA: Diagnosis not present

## 2014-11-13 DIAGNOSIS — M545 Low back pain: Secondary | ICD-10-CM | POA: Insufficient documentation

## 2014-11-13 DIAGNOSIS — D751 Secondary polycythemia: Secondary | ICD-10-CM

## 2014-11-13 DIAGNOSIS — E039 Hypothyroidism, unspecified: Secondary | ICD-10-CM | POA: Insufficient documentation

## 2014-11-13 DIAGNOSIS — J449 Chronic obstructive pulmonary disease, unspecified: Secondary | ICD-10-CM | POA: Insufficient documentation

## 2014-11-13 DIAGNOSIS — Z79899 Other long term (current) drug therapy: Secondary | ICD-10-CM | POA: Diagnosis not present

## 2014-11-13 DIAGNOSIS — F17210 Nicotine dependence, cigarettes, uncomplicated: Secondary | ICD-10-CM | POA: Insufficient documentation

## 2014-11-13 DIAGNOSIS — M109 Gout, unspecified: Secondary | ICD-10-CM | POA: Insufficient documentation

## 2014-11-13 DIAGNOSIS — K219 Gastro-esophageal reflux disease without esophagitis: Secondary | ICD-10-CM | POA: Diagnosis not present

## 2014-11-13 DIAGNOSIS — G8929 Other chronic pain: Secondary | ICD-10-CM | POA: Diagnosis not present

## 2014-11-13 DIAGNOSIS — D72829 Elevated white blood cell count, unspecified: Secondary | ICD-10-CM | POA: Diagnosis not present

## 2014-11-13 DIAGNOSIS — Z23 Encounter for immunization: Secondary | ICD-10-CM

## 2014-11-13 LAB — CBC WITH DIFFERENTIAL/PLATELET
BASOS PCT: 1 %
Basophils Absolute: 0.1 10*3/uL (ref 0–0.1)
EOS ABS: 0.3 10*3/uL (ref 0–0.7)
EOS PCT: 2 %
HCT: 50.6 % (ref 40.0–52.0)
HEMOGLOBIN: 16.6 g/dL (ref 13.0–18.0)
Lymphocytes Relative: 30 %
Lymphs Abs: 3.2 10*3/uL (ref 1.0–3.6)
MCH: 27 pg (ref 26.0–34.0)
MCHC: 32.8 g/dL (ref 32.0–36.0)
MCV: 82.3 fL (ref 80.0–100.0)
Monocytes Absolute: 0.7 10*3/uL (ref 0.2–1.0)
Monocytes Relative: 7 %
NEUTROS PCT: 60 %
Neutro Abs: 6.4 10*3/uL (ref 1.4–6.5)
PLATELETS: 367 10*3/uL (ref 150–440)
RBC: 6.15 MIL/uL — AB (ref 4.40–5.90)
RDW: 15.6 % — ABNORMAL HIGH (ref 11.5–14.5)
WBC: 10.8 10*3/uL — AB (ref 3.8–10.6)

## 2014-11-13 MED ORDER — INFLUENZA VAC SPLIT QUAD 0.5 ML IM SUSY: 0.5000 mL | PREFILLED_SYRINGE | Freq: Once | INTRAMUSCULAR | Status: DC

## 2014-11-13 NOTE — Progress Notes (Signed)
[facility]  [phone]  Fax:([phone]) [place]: 1960/09/12  MR#: [id number]  CXK#:[id number]  Patient Care Team: Arnetha Courser, MD as PCP - General (Family Medicine)  CHIEF COMPLAINT:  Chief Complaint  Patient presents with  . erythrocytosis  . leukocytosis   Workup done on 11/13/13 -  WBC 10800, 61% neutrophils, 26% lymphs, platelets 346, Hb 18.3, Hct 55.7. Carboxyhemoglobin 5.4%. Serum erythropoietin 9.0. LAP score 99.  Otherwise BCR-ABL study, peripheral blood flow cytometry, iron study all unremarkable.  Started phlebotomy on 11/15/13.  INTERVAL HISTORY:  Patient is here for continued follow-up regarding persistent erythrocytosis, likely secondary to history of smoking and COPD, as well as leukocytosis. Patient reports overall feeling well. He denies any acute complaints. Patient does report having some chronic dyspnea on exertion related to smoking and COPD, but none worsening in the last 6 months. He denies any recurrent or severe headaches, no recent history of DVT, PE, TIA, or stroke.  REVIEW OF SYSTEMS:   Review of Systems  Constitutional: Negative for fever, chills, weight loss, malaise/fatigue and diaphoresis.  HENT: Negative for congestion, ear discharge, ear pain, hearing loss, nosebleeds, sore throat and tinnitus.   Eyes: Negative for blurred vision, double vision, photophobia, pain, discharge and redness.  Respiratory: Negative for cough, hemoptysis, sputum production, shortness of breath, wheezing and stridor.   Cardiovascular: Negative for chest pain, palpitations, orthopnea, claudication, leg swelling and PND.  Gastrointestinal: Negative for heartburn, nausea, vomiting, abdominal pain, diarrhea, constipation, blood in stool and melena.  Genitourinary: Negative.   Musculoskeletal: Negative.   Skin: Negative.   Neurological: Negative for dizziness, tingling, focal weakness, seizures, weakness and  headaches.  Endo/Heme/Allergies: Does not bruise/bleed easily.  Psychiatric/Behavioral: Negative for depression. The patient is not nervous/anxious and does not have insomnia.     As per HPI. Otherwise, a complete review of systems is negatve.   PAST MEDICAL HISTORY: Past Medical History  Diagnosis Date  . AAT (alpha-1-antitrypsin) deficiency ([place])   . COPD (chronic obstructive pulmonary disease) ([place])   . Tobacco use   . Hyperlipidemia   . Hypertension   . Chronic low back pain   . TMJ (temporomandibular joint disorder)   . Gout   . Hypothyroidism   . Goiter   . Thyroid cyst     x 2  . GERD (gastroesophageal reflux disease)   . Depression   . Anxiety   . Vitamin D deficiency disease   . Insomnia   . Panic attacks     PAST SURGICAL HISTORY: History reviewed. No pertinent past surgical history.  FAMILY HISTORY Family History  Problem Relation Age of Onset  . Heart disease Father   . Heart failure Father   . Heart attack Father     x 5  . Alcohol abuse Father   . Heart attack Brother   . CAD Brother   . Hypertension Brother   . Alcohol abuse Paternal Grandfather     GYNECOLOGIC HISTORY:  No LMP for male patient.     ADVANCED DIRECTIVES:    HEALTH MAINTENANCE: Social History  Substance Use Topics  . Smoking status: Current Every Day Smoker -- 1.00 packs/day for 40 years    Types: Cigarettes  . Smokeless tobacco: Never Used  . Alcohol Use: No     Colonoscopy:  PAP:  Bone density:  Lipid panel:  Allergies  Allergen Reactions  . No Known Allergies     Current Outpatient Prescriptions  Medication Sig Dispense Refill  . levothyroxine (SYNTHROID, LEVOTHROID) 100 MCG tablet TAKE 1 TABLET BY MOUTH ONCE A DAY 30 tablet 8  . lisinopril-hydrochlorothiazide (PRINZIDE,ZESTORETIC) 20-12.5 MG per tablet TAKE 1 AND 1/2 TABLET BY MOUTH EVERY DAY 45 tablet 7  . PARoxetine (PAXIL) 20 MG tablet Take 1 tablet (20 mg total) by mouth 2 (two) times daily. 60 tablet 1   . simvastatin (ZOCOR) 20 MG tablet TAKE 1 TABLET (20 MG TOTAL) BY MOUTH DAILY. 30 tablet 1   Current Facility-Administered Medications  Medication Dose Route Frequency Provider Last Rate Last Dose  . Influenza vac split quadrivalent PF (FLUARIX) injection 0.5 mL  0.5 mL Intramuscular Once Evlyn Kanner, NP        OBJECTIVE: There were no vitals taken for this visit.   There is no weight on file to calculate BMI.    ECOG FS:0 - Asymptomatic  General: Well-developed, well-nourished, no acute distress. Eyes: Pink conjunctiva, anicteric sclera. HEENT: Normocephalic, moist mucous membranes, clear oropharnyx. Lungs: Clear to auscultation bilaterally. Heart: Regular rate and rhythm. No rubs, murmurs, or gallops. Musculoskeletal: No edema, cyanosis, or clubbing. Neuro: Alert, answering all questions appropriately. Cranial nerves grossly intact. Skin: No rashes or petechiae noted.  LAB RESULTS:  Appointment on 11/13/2014  Component Date Value Ref Range Status  . WBC 11/13/2014 10.8* 3.8 - 10.6 K/uL Final  . RBC 11/13/2014 6.15* 4.40 - 5.90 MIL/uL Final  . Hemoglobin 11/13/2014 16.6  13.0 - 18.0 g/dL Final  . HCT 11/13/2014 50.6  40.0 - 52.0 % Final  . MCV 11/13/2014 82.3  80.0 - 100.0 fL Final  . MCH 11/13/2014 27.0  26.0 - 34.0 pg Final  . MCHC 11/13/2014 32.8  32.0 - 36.0 g/dL Final  . RDW 11/13/2014 15.6* 11.5 - 14.5 % Final  . Platelets 11/13/2014 367  150 - 440 K/uL Final  . Neutrophils Relative % 11/13/2014 60   Final  . Neutro Abs 11/13/2014 6.4  1.4 - 6.5 K/uL Final  . Lymphocytes Relative 11/13/2014 30   Final  . Lymphs Abs 11/13/2014 3.2  1.0 - 3.6 K/uL Final  . Monocytes Relative 11/13/2014 7   Final  . Monocytes Absolute 11/13/2014 0.7  0.2 - 1.0 K/uL Final  . Eosinophils Relative 11/13/2014 2   Final  . Eosinophils Absolute 11/13/2014 0.3  0 - 0.7 K/uL Final  . Basophils Relative 11/13/2014 1   Final  . Basophils Absolute 11/13/2014 0.1  0 - 0.1 K/uL Final     STUDIES: No results found.  ASSESSMENT:  Persistent erythrocytosis and leukocytosis.  PLAN:   1. Persistent erythrocytosis. Likely secondary to smoking history and COPD. Lab values as above. Hematocrit today 50.6, previously established target hematocrit is 48%. As patient's hematocrit is greater than 48% he will receive a 300 ML phlebotomy today. Discussed with patient smoking cessation, he currently refuses any further counseling. 2. Persistent leukocytosis. WBC today 10.8. Overall counts are fairly stable. We will continue to monitor.  Patient to return every 4 weeks to evaluate hematocrit and for possible phlebotomy. Patient will return in 6 months to establish with new Hematologist.  Patient expressed understanding and was in agreement with this plan. He also understands that He can call clinic at any time with any questions, concerns, or complaints.   Dr. Oliva Bustard was available for consultation and review of plan of care for this patient.   Evlyn Kanner, NP   11/13/2014 1:46 PM

## 2014-11-15 IMAGING — CT CT ABDOMEN W/ CM
1 series · 1 of 1 positions shown · IV contrast (isovue)
Comparison: None.

CLINICAL DATA: Abdominal mass.  Cigarette smoker with sweats.

EXAM:
CT ABDOMEN WITH CONTRAST
TECHNIQUE: Multidetector CT imaging of the abdomen was performed using the
standard protocol following bolus administration of intravenous
contrast.
CONTRAST:  100 mL Isovue 370.

[Series 1: topogram 0.6 t20f · coronal · 1.00mm/px · 1 of 1 slices shown]
[im 1/1]
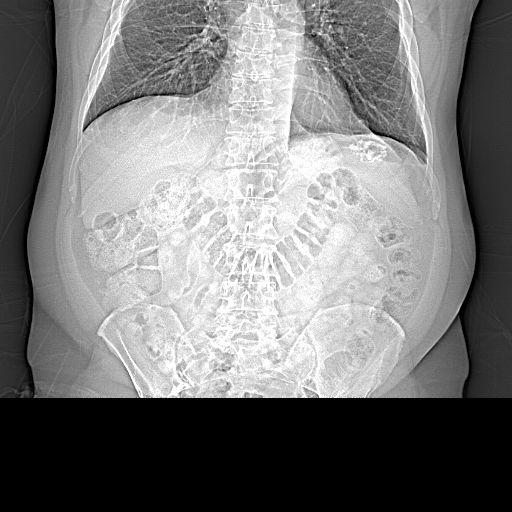

[1 of 1 positions shown; findings below may reference images not displayed]

FINDINGS: Bones: Scattered Schmorl's nodes are present. Thoracic spondylosis.
No aggressive osseous lesions.

Lung Bases: Minimal atelectasis or scarring in the right middle
lobe. Mild dependent atelectasis.

Liver:  Normal.

Spleen: There is a calcified mass over the superior aspect of the
spleen abutting the right hemidiaphragm. Calcification is dense and
peripheral. This mass measures 45 mm x 20 mm on axial imaging.
Craniocaudal measurement is about 26 mm. Remainder of the spleen
appears within normal limits. Tiny nodule is present in adjacent to
the posterior aspect of the stomach measuring 8 mm, most compatible
with an accessory spleen.

Gallbladder:  Normal.

Common bile duct:  Normal.

Pancreas:  Normal.

Adrenal glands:  Normal.

Kidneys: Simple left inferior pole renal cyst. Other tiny renal
cortical lesions are present which are too small to characterize but
probably represent cysts. Normal enhancement and excretion of the
kidneys. The proximal ureters appear normal.

Stomach:  Normal.

Small bowel:  Normal.

Colon: Visualized portions of the colon are within normal limits.
Normal appendix. Moderate to large stool burden.

Vasculature: Mild dilation of the infrarenal abdominal aorta
measuring up to 27 mm (image 34 series 2).

Body Wall:   Fat containing periumbilical hernia.
IMPRESSION: 1. Calcified splenic lesion abutting the inferior left
hemidiaphragm. Differential considerations are old trauma with
calcification of subcapsular hematoma, partially calcified cyst,
hemangioma, lymphangioma or old infection.
2. Ectatic abdominal aorta at risk for aneurysm development.
Recommend follow up by US in 5 years. This recommendation follows
ACR consensus guidelines: White Paper of the ACR Incidental Findings
Committee II on Vascular Findings. [HOSPITAL] [id number];
[DATE].
3. Left renal cyst.

## 2014-12-03 ENCOUNTER — Ambulatory Visit: Payer: Medicaid Other | Admitting: Family Medicine

## 2014-12-05 ENCOUNTER — Other Ambulatory Visit: Payer: Self-pay | Admitting: Family Medicine

## 2014-12-05 NOTE — Telephone Encounter (Signed)
I referred patient to psychiatrist in August; please have him contact his psychiatrist for any refills or adjustments now in his mood medicine; thank you

## 2014-12-05 NOTE — Telephone Encounter (Signed)
Left message to call.

## 2014-12-06 NOTE — Telephone Encounter (Signed)
I tried to call patient as well; left message; wanting to follow-up with him; I did provide refill for one week, asked him to call back; also want to f/u on GI referral; it looks like he cancelled on his own

## 2014-12-06 NOTE — Telephone Encounter (Signed)
Left message to call.

## 2014-12-07 NOTE — Telephone Encounter (Signed)
Left message to call.

## 2014-12-10 NOTE — Telephone Encounter (Signed)
Patient called back and left a message that he was returning our call.

## 2014-12-10 NOTE — Telephone Encounter (Signed)
Left message to call.

## 2014-12-10 NOTE — Telephone Encounter (Signed)
I finally spoke with patient, he states he never heard anything about the referral to psych so he hasn't gone. He did schedule an appointment to follow up with you on Dec. 14th. He says he has enough to last him until his appt.

## 2014-12-11 ENCOUNTER — Inpatient Hospital Stay: Payer: Medicaid Other | Attending: Oncology

## 2014-12-11 ENCOUNTER — Inpatient Hospital Stay: Payer: Medicaid Other

## 2014-12-14 ENCOUNTER — Other Ambulatory Visit: Payer: Self-pay | Admitting: Family Medicine

## 2014-12-19 ENCOUNTER — Ambulatory Visit: Payer: Self-pay | Admitting: Family Medicine

## 2015-01-08 ENCOUNTER — Inpatient Hospital Stay: Payer: Medicaid Other | Attending: Oncology

## 2015-01-08 ENCOUNTER — Ambulatory Visit (INDEPENDENT_AMBULATORY_CARE_PROVIDER_SITE_OTHER): Payer: Medicaid Other | Admitting: Family Medicine

## 2015-01-08 ENCOUNTER — Inpatient Hospital Stay: Payer: Medicaid Other

## 2015-01-08 ENCOUNTER — Encounter: Payer: Self-pay | Admitting: Family Medicine

## 2015-01-08 VITALS — BP 131/77 | HR 60 | Temp 97.7°F | Ht 70.0 in | Wt 210.0 lb

## 2015-01-08 DIAGNOSIS — F17210 Nicotine dependence, cigarettes, uncomplicated: Secondary | ICD-10-CM | POA: Diagnosis not present

## 2015-01-08 DIAGNOSIS — E669 Obesity, unspecified: Secondary | ICD-10-CM

## 2015-01-08 DIAGNOSIS — M5136 Other intervertebral disc degeneration, lumbar region: Secondary | ICD-10-CM

## 2015-01-08 DIAGNOSIS — F419 Anxiety disorder, unspecified: Secondary | ICD-10-CM

## 2015-01-08 DIAGNOSIS — Z23 Encounter for immunization: Secondary | ICD-10-CM | POA: Diagnosis not present

## 2015-01-08 DIAGNOSIS — M419 Scoliosis, unspecified: Secondary | ICD-10-CM | POA: Diagnosis not present

## 2015-01-08 DIAGNOSIS — M48061 Spinal stenosis, lumbar region without neurogenic claudication: Secondary | ICD-10-CM

## 2015-01-08 DIAGNOSIS — D72829 Elevated white blood cell count, unspecified: Secondary | ICD-10-CM | POA: Diagnosis not present

## 2015-01-08 DIAGNOSIS — E559 Vitamin D deficiency, unspecified: Secondary | ICD-10-CM | POA: Diagnosis not present

## 2015-01-08 DIAGNOSIS — Z720 Tobacco use: Secondary | ICD-10-CM | POA: Diagnosis not present

## 2015-01-08 DIAGNOSIS — E785 Hyperlipidemia, unspecified: Secondary | ICD-10-CM | POA: Diagnosis not present

## 2015-01-08 DIAGNOSIS — E038 Other specified hypothyroidism: Secondary | ICD-10-CM | POA: Diagnosis not present

## 2015-01-08 DIAGNOSIS — M4806 Spinal stenosis, lumbar region: Secondary | ICD-10-CM | POA: Diagnosis not present

## 2015-01-08 DIAGNOSIS — D751 Secondary polycythemia: Secondary | ICD-10-CM

## 2015-01-08 DIAGNOSIS — I10 Essential (primary) hypertension: Secondary | ICD-10-CM

## 2015-01-08 DIAGNOSIS — F3132 Bipolar disorder, current episode depressed, moderate: Secondary | ICD-10-CM

## 2015-01-08 DIAGNOSIS — J449 Chronic obstructive pulmonary disease, unspecified: Secondary | ICD-10-CM | POA: Insufficient documentation

## 2015-01-08 DIAGNOSIS — Z5181 Encounter for therapeutic drug level monitoring: Secondary | ICD-10-CM | POA: Diagnosis not present

## 2015-01-08 LAB — HEMOGLOBIN AND HEMATOCRIT, BLOOD
HCT: 48.5 % (ref 40.0–52.0)
Hemoglobin: 16.1 g/dL (ref 13.0–18.0)

## 2015-01-08 NOTE — Assessment & Plan Note (Signed)
Check level today and supplement if needed 

## 2015-01-08 NOTE — Assessment & Plan Note (Signed)
Patient declines recommendation to see psychiatrist

## 2015-01-08 NOTE — Assessment & Plan Note (Signed)
Check fasting lipids 

## 2015-01-08 NOTE — Assessment & Plan Note (Signed)
Well-controlled; continue ACE-I/thiazide; check creatinine and K+ today; DASH guidelines, weight loss, increased activity

## 2015-01-08 NOTE — Patient Instructions (Addendum)
I do encourage you to quit smoking Call [phone] to sign up for smoking cessation classes You can call 1-800-QUIT-NOW to talk with a smoking cessation coach We'll get a chest CT ordered for you We'll refer you to the orthopaedist about your back We'll get labs today If you have not heard anything from my staff in a week about any orders/referrals/studies from today, please contact us here to follow-up [phone] You received the flu shot today; it should protect you against the flu virus over the coming months; it will take about two weeks for antibodies to develop; do try to stay away from hospitals, nursing homes, and daycares during peak flu season; taking extra vitamin C daily during flu season may help you avoid getting sick Try to work on modest weight loss, cutting back on portion sizes, limiting sodas and empty calories, etc. Try to walk more to increase your activity; start with maybe just ten minutes a day at a slow pace, and then build up gradually; over the coming months, try to build up to 30 minutes a day on five or more days per week; that will help your weight, energy level, mood, muscle tone, cholesterol, etc.  Smoking Hazards Smoking cigarettes is extremely bad for your health. Tobacco smoke has over 200 known poisons in it. It contains the poisonous gases nitrogen oxide and carbon monoxide. There are over 60 chemicals in tobacco smoke that cause cancer. Some of the chemicals found in cigarette smoke include:   Cyanide.   Benzene.   Formaldehyde.   Methanol (wood alcohol).   Acetylene (fuel used in welding torches).   Ammonia.  Even smoking lightly shortens your life expectancy by several years. You can greatly reduce the risk of medical problems for you and your family by stopping now. Smoking is the most preventable cause of death and disease in our society. Within days of quitting smoking, your circulation improves, you decrease the risk of having a heart  attack, and your lung capacity improves. There may be some increased phlegm in the first few days after quitting, and it may take months for your lungs to clear up completely. Quitting for 10 years reduces your risk of developing lung cancer to almost that of a nonsmoker.  WHAT ARE THE RISKS OF SMOKING? Cigarette smokers have an increased risk of many serious medical problems, including:  Lung cancer.   Lung disease (such as pneumonia, bronchitis, and emphysema).   Heart attack and chest pain due to the heart not getting enough oxygen (angina).   Heart disease and peripheral blood vessel disease.   Hypertension.   Stroke.   Oral cancer (cancer of the lip, mouth, or voice box).   Bladder cancer.   Pancreatic cancer.   Cervical cancer.   Pregnancy complications, including premature birth.   Stillbirths and smaller newborn babies, birth defects, and genetic damage to sperm.   Early menopause.   Lower estrogen level for women.   Infertility.   Facial wrinkles.   Blindness.   Increased risk of broken bones (fractures).   Senile dementia.   Stomach ulcers and internal bleeding.   Delayed wound healing and increased risk of complications during surgery. Because of secondhand smoke exposure, children of smokers have an increased risk of the following:   Sudden infant death syndrome (SIDS).   Respiratory infections.   Lung cancer.   Heart disease.   Ear infections.  WHY IS SMOKING ADDICTIVE? Nicotine is the chemical agent in tobacco that is capable of  causing addiction or dependence. When you smoke and inhale, nicotine is absorbed rapidly into the bloodstream through your lungs. Both inhaled and noninhaled nicotine may be addictive.  WHAT ARE THE BENEFITS OF QUITTING?  There are many health benefits to quitting smoking. Some are:   The likelihood of developing cancer and heart disease decreases. Health improvements are seen almost  immediately.   Blood pressure, pulse rate, and breathing patterns start returning to normal soon after quitting.   People who quit may see an improvement in their overall quality of life.  HOW DO YOU QUIT SMOKING? Smoking is an addiction with both physical and psychological effects, and longtime habits can be hard to change. Your health care provider can recommend:  Programs and community resources, which may include group support, education, or therapy.  Replacement products, such as patches, gum, and nasal sprays. Use these products only as directed. Do not replace cigarette smoking with electronic cigarettes (commonly called e-cigarettes). The safety of e-cigarettes is unknown, and some may contain harmful chemicals. FOR MORE INFORMATION  American Lung Association: www.lung.org  American Cancer Society: www.cancer.org   This information is not intended to replace advice given to you by your health care provider. Make sure you discuss any questions you have with your health care provider.   Document Released: 01/30/2004 Document Revised: 10/12/2012 Document Reviewed: 06/13/2012 Elsevier Interactive Patient Education 2016 [employer]. Smoking Cessation, Tips for Success If you are ready to quit smoking, congratulations! You have chosen to help yourself be healthier. Cigarettes bring nicotine, tar, carbon monoxide, and other irritants into your body. Your lungs, heart, and blood vessels will be able to work better without these poisons. There are many different ways to quit smoking. Nicotine gum, nicotine patches, a nicotine inhaler, or nicotine nasal spray can help with physical craving. Hypnosis, support groups, and medicines help break the habit of smoking. WHAT THINGS CAN I DO TO MAKE QUITTING EASIER?  Here are some tips to help you quit for good:  Pick a date when you will quit smoking completely. Tell all of your friends and family about your plan to quit on that date.  Do not  try to slowly cut down on the number of cigarettes you are smoking. Pick a quit date and quit smoking completely starting on that day.  Throw away all cigarettes.   Clean and remove all ashtrays from your home, work, and car.  On a card, write down your reasons for quitting. Carry the card with you and read it when you get the urge to smoke.  Cleanse your body of nicotine. Drink enough water and fluids to keep your urine clear or pale yellow. Do this after quitting to flush the nicotine from your body.  Learn to predict your moods. Do not let a bad situation be your excuse to have a cigarette. Some situations in your life might tempt you into wanting a cigarette.  Never have "just one" cigarette. It leads to wanting another and another. Remind yourself of your decision to quit.  Change habits associated with smoking. If you smoked while driving or when feeling stressed, try other activities to replace smoking. Stand up when drinking your coffee. Brush your teeth after eating. Sit in a different chair when you read the paper. Avoid alcohol while trying to quit, and try to drink fewer caffeinated beverages. Alcohol and caffeine may urge you to smoke.  Avoid foods and drinks that can trigger a desire to smoke, such as sugary or spicy foods  and alcohol.  Ask people who smoke not to smoke around you.  Have something planned to do right after eating or having a cup of coffee. For example, plan to take a walk or exercise.  Try a relaxation exercise to calm you down and decrease your stress. Remember, you may be tense and nervous for the first 2 weeks after you quit, but this will pass.  Find new activities to keep your hands busy. Play with a pen, coin, or rubber band. Doodle or draw things on paper.  Brush your teeth right after eating. This will help cut down on the craving for the taste of tobacco after meals. You can also try mouthwash.   Use oral substitutes in place of cigarettes. Try  using lemon drops, carrots, cinnamon sticks, or chewing gum. Keep them handy so they are available when you have the urge to smoke.  When you have the urge to smoke, try deep breathing.  Designate your home as a nonsmoking area.  If you are a heavy smoker, ask your health care provider about a prescription for nicotine chewing gum. It can ease your withdrawal from nicotine.  Reward yourself. Set aside the cigarette money you save and buy yourself something nice.  Look for support from others. Join a support group or smoking cessation program. Ask someone at home or at work to help you with your plan to quit smoking.  Always ask yourself, "Do I need this cigarette or is this just a reflex?" Tell yourself, "Today, I choose not to smoke," or "I do not want to smoke." You are reminding yourself of your decision to quit.  Do not replace cigarette smoking with electronic cigarettes (commonly called e-cigarettes). The safety of e-cigarettes is unknown, and some may contain harmful chemicals.  If you relapse, do not give up! Plan ahead and think about what you will do the next time you get the urge to smoke. HOW WILL I FEEL WHEN I QUIT SMOKING? You may have symptoms of withdrawal because your body is used to nicotine (the addictive substance in cigarettes). You may crave cigarettes, be irritable, feel very hungry, cough often, get headaches, or have difficulty concentrating. The withdrawal symptoms are only temporary. They are strongest when you first quit but will go away within 10-14 days. When withdrawal symptoms occur, stay in control. Think about your reasons for quitting. Remind yourself that these are signs that your body is healing and getting used to being without cigarettes. Remember that withdrawal symptoms are easier to treat than the major diseases that smoking can cause.  Even after the withdrawal is over, expect periodic urges to smoke. However, these cravings are generally short lived and  will go away whether you smoke or not. Do not smoke! WHAT RESOURCES ARE AVAILABLE TO HELP ME QUIT SMOKING? Your health care provider can direct you to community resources or hospitals for support, which may include:  Group support.  Education.  Hypnosis.  Therapy.   This information is not intended to replace advice given to you by your health care provider. Make sure you discuss any questions you have with your health care provider.   Document Released: 09/20/2003 Document Revised: 01/12/2014 Document Reviewed: 06/09/2012 Elsevier Interactive Patient Education 2016 [place] DASH stands for "Dietary Approaches to Stop Hypertension." The DASH eating plan is a healthy eating plan that has been shown to reduce high blood pressure (hypertension). Additional health benefits may include reducing the risk of type 2 diabetes mellitus,  heart disease, and stroke. The DASH eating plan may also help with weight loss. WHAT DO I NEED TO KNOW ABOUT THE DASH EATING PLAN? For the DASH eating plan, you will follow these general guidelines:  Choose foods with a percent daily value for sodium of less than 5% (as listed on the food label).  Use salt-free seasonings or herbs instead of table salt or sea salt.  Check with your health care provider or pharmacist before using salt substitutes.  Eat lower-sodium products, often labeled as "lower sodium" or "no salt added."  Eat fresh foods.  Eat more vegetables, fruits, and low-fat dairy products.  Choose whole grains. Look for the word "whole" as the first word in the ingredient list.  Choose fish and skinless chicken or [place] more often than red meat. Limit fish, poultry, and meat to 6 oz (170 g) each day.  Limit sweets, desserts, sugars, and sugary drinks.  Choose heart-healthy fats.  Limit cheese to 1 oz (28 g) per day.  Eat more home-cooked food and less restaurant, buffet, and fast food.  Limit fried foods.  Cook  foods using methods other than frying.  Limit canned vegetables. If you do use them, rinse them well to decrease the sodium.  When eating at a restaurant, ask that your food be prepared with less salt, or no salt if possible. WHAT FOODS CAN I EAT? Seek help from a dietitian for individual calorie needs. Grains Whole grain or whole wheat bread. Brown rice. Whole grain or whole wheat pasta. Quinoa, bulgur, and whole grain cereals. Low-sodium cereals. Corn or whole wheat flour tortillas. Whole grain cornbread. Whole grain crackers. Low-sodium crackers. Vegetables Fresh or frozen vegetables (raw, steamed, roasted, or grilled). Low-sodium or reduced-sodium tomato and vegetable juices. Low-sodium or reduced-sodium tomato sauce and paste. Low-sodium or reduced-sodium canned vegetables.  Fruits All fresh, canned (in natural juice), or frozen fruits. Meat and Other Protein Products Ground beef (85% or leaner), grass-fed beef, or beef trimmed of fat. Skinless chicken or [place]. Ground chicken or [place]. Pork trimmed of fat. All fish and seafood. Eggs. Dried beans, peas, or lentils. Unsalted nuts and seeds. Unsalted canned beans. Dairy Low-fat dairy products, such as skim or 1% milk, 2% or reduced-fat cheeses, low-fat ricotta or cottage cheese, or plain low-fat yogurt. Low-sodium or reduced-sodium cheeses. Fats and Oils Tub margarines without trans fats. Light or reduced-fat mayonnaise and salad dressings (reduced sodium). Avocado. Safflower, olive, or canola oils. Natural peanut or almond butter. Other Unsalted popcorn and pretzels. The items listed above may not be a complete list of recommended foods or beverages. Contact your dietitian for more options. WHAT FOODS ARE NOT RECOMMENDED? Grains White bread. White pasta. White rice. Refined cornbread. Bagels and croissants. Crackers that contain trans fat. Vegetables Creamed or fried vegetables. Vegetables in a cheese sauce. Regular canned vegetables.  Regular canned tomato sauce and paste. Regular tomato and vegetable juices. Fruits Dried fruits. Canned fruit in light or heavy syrup. Fruit juice. Meat and Other Protein Products Fatty cuts of meat. Ribs, chicken wings, bacon, sausage, bologna, salami, chitterlings, fatback, hot dogs, bratwurst, and packaged luncheon meats. Salted nuts and seeds. Canned beans with salt. Dairy Whole or 2% milk, cream, half-and-half, and cream cheese. Whole-fat or sweetened yogurt. Full-fat cheeses or blue cheese. Nondairy creamers and whipped toppings. Processed cheese, cheese spreads, or cheese curds. Condiments Onion and garlic salt, seasoned salt, table salt, and sea salt. Canned and packaged gravies. Worcestershire sauce. Tartar sauce. Barbecue sauce. Teriyaki sauce. Soy sauce,  including reduced sodium. Steak sauce. Fish sauce. Oyster sauce. Cocktail sauce. Horseradish. Ketchup and mustard. Meat flavorings and tenderizers. Bouillon cubes. Hot sauce. Tabasco sauce. Marinades. Taco seasonings. Relishes. Fats and Oils Butter, stick margarine, lard, shortening, ghee, and bacon fat. Coconut, palm kernel, or palm oils. Regular salad dressings. Other Pickles and olives. Salted popcorn and pretzels. The items listed above may not be a complete list of foods and beverages to avoid. Contact your dietitian for more information. WHERE CAN I FIND MORE INFORMATION? National Heart, Lung, and Blood Institute: travelstabloid.com   This information is not intended to replace advice given to you by your health care provider. Make sure you discuss any questions you have with your health care provider.   Document Released: 12/11/2010 Document Revised: 01/12/2014 Document Reviewed: 10/26/2012 Elsevier Interactive Patient Education [employer].

## 2015-01-08 NOTE — Assessment & Plan Note (Signed)
Encouraged modest weight loss with decreased portions, limiting soda, etc.

## 2015-01-08 NOTE — Assessment & Plan Note (Signed)
Refer to ortho.

## 2015-01-08 NOTE — Assessment & Plan Note (Signed)
Check TSH since he has gained weight; adjust if needed

## 2015-01-08 NOTE — Progress Notes (Signed)
BP 131/77 mmHg  Pulse 60  Temp(Src) 97.7 F (36.5 C)  Ht 5\' 10"  (1.778 m)  Wt 210 lb (95.255 kg)  BMI 30.13 kg/m2  SpO2 97%   Subjective:    Patient ID: Tyler Sox Sr., male    DOB: 02-10-1960, 55 y.o.   MRN: [id number]  HPI: Tyler Cobb. is a 55 y.o. male  Chief Complaint  Patient presents with  . Anxiety    He needs a refill on Paroxetine.  . Follow-up    "I'm not sure what I'm here for"   GAD 7 : Generalized Anxiety Score 01/08/2015  Nervous, Anxious, on Edge 3  Control/stop worrying 3  Worry too much - different things 3  Trouble relaxing 1  Restless 0  Easily annoyed or irritable 1  Afraid - awful might happen 0  Total GAD 7 Score 11  Anxiety Difficulty Very difficult   He is not taking any controlled substances; he does not want any more; he is getting sleep again, sleeps like a log now; he thinks it was the "klonopin and xanax" (I was only prescribing one for him); he denies taking both, "just one or the other"; he does not want any more of that kind of stuff He does not want to see a psychiatrist; he has been dealing with it for 36 years and does not want medicine; "I'm fine" he says  Hypothyroidism; every month or two he has a hemorrhoid that let's loose; he got checked out by Dr. Vira Cobb and he has an inflamed internal hemorrhoid; he eats beans and gets fiber; not much energy; no known thyroid trouble in the family  The main problem he has been having is his back; it's been killing me he says; that's why he is on disability he says; moving a certain way just pains him; right shoulder hurts and it runs down the arm; leg and arm will jump at times; he went to a chiropractor and he has scoliosis, spinal stenosis, sciatica and a herniated disc; he has seen a back doctor in [place]; I offered specialist or physical therapy; he agrees to ortho; at its worst, his pain goes up to a 7 out of 10; it has gotten to the point where he can't get out of bed;  taking advil for pain right now  He has high cholesterol; on statin; no muscle aches or belly pain; not watching a good diet; eats eggs and whatever he can; "I don't have a good diet"; gaining some weight, drinking soda, not exercising  He has elevated RBC and goes to the cancer center for this today  Relevant past medical, surgical, family and social history reviewed and updated as indicated. Interim medical history since our last visit reviewed. Allergies and medications reviewed and updated.  Review of Systems Per HPI unless specifically indicated above     Objective:    BP 131/77 mmHg  Pulse 60  Temp(Src) 97.7 F (36.5 C)  Ht 5\' 10"  (1.778 m)  Wt 210 lb (95.255 kg)  BMI 30.13 kg/m2  SpO2 97%  Wt Readings from Last 3 Encounters:  01/08/15 210 lb (95.255 kg)  08/24/14 201 lb (91.173 kg)  05/08/14 202 lb 9.6 oz (91.9 kg)    Physical Exam  Constitutional: He appears well-developed and well-nourished.  obese  Eyes: EOM are normal. No scleral icterus.  Neck: No JVD present.  Cardiovascular: Normal rate and regular rhythm.   Pulmonary/Chest: Effort normal and breath sounds normal. No respiratory  distress.  Abdominal: Soft. Bowel sounds are normal. He exhibits no distension.  Musculoskeletal: Normal range of motion. He exhibits no edema.       Lumbar back: He exhibits tenderness. He exhibits normal range of motion, no swelling and no spasm.  Tender to palpation over the SI joints as well as lumbar region, paraspinous muscles  Neurological: He is alert.  Skin: Skin is warm and dry. No pallor.  Psychiatric: He has a normal mood and affect. His affect is not blunt. His speech is not delayed. He is not slowed and not withdrawn. Cognition and memory are normal.  Good eye contact with examiner   Results for orders placed or performed in visit on 11/13/14  CBC with Differential  Result Value Ref Range   WBC 10.8 (H) 3.8 - 10.6 K/uL   RBC 6.15 (H) 4.40 - 5.90 MIL/uL   Hemoglobin  16.6 13.0 - 18.0 g/dL   HCT 50.6 40.0 - 52.0 %   MCV 82.3 80.0 - 100.0 fL   MCH 27.0 26.0 - 34.0 pg   MCHC 32.8 32.0 - 36.0 g/dL   RDW 15.6 (H) 11.5 - 14.5 %   Platelets 367 150 - 440 K/uL   Neutrophils Relative % 60 %   Neutro Abs 6.4 1.4 - 6.5 K/uL   Lymphocytes Relative 30 %   Lymphs Abs 3.2 1.0 - 3.6 K/uL   Monocytes Relative 7 %   Monocytes Absolute 0.7 0.2 - 1.0 K/uL   Eosinophils Relative 2 %   Eosinophils Absolute 0.3 0 - 0.7 K/uL   Basophils Relative 1 %   Basophils Absolute 0.1 0 - 0.1 K/uL      Assessment & Plan:   Problem List Items Addressed This Visit      Cardiovascular and Mediastinum   Hypertension    Well-controlled; continue ACE-I/thiazide; check creatinine and K+ today; DASH guidelines, weight loss, increased activity        Endocrine   Hypothyroidism    Check TSH since he has gained weight; adjust if needed      Relevant Orders   TSH     Musculoskeletal and Integument   Scoliosis   Relevant Orders   Ambulatory referral to Orthopedic Surgery   DDD (degenerative disc disease), lumbar   Relevant Orders   Ambulatory referral to Orthopedic Surgery     Other   Secondary erythrocytosis    Still going to the cancer center for this      Tobacco use    Smoking cessation encouraged; he is aware of the health consequences of smoking like heart attack, emphysema, leukemia, stroke, see AVS; explained low dose chest CT for lung cancer screening, recommended, ordered; he wants to wait until February      Relevant Orders   CT CHEST LUNG CA SCREEN LOW DOSE W/O CM   Hyperlipidemia    Check fasting lipids      Relevant Orders   Lipid Panel w/o Chol/HDL Ratio   Anxiety    Continue SSRI; patient does not want to see psychiatrist, does not want benzos      Bipolar disorder ([place])    Patient declines recommendation to see psychiatrist      Vitamin D deficiency disease    Check level today and supplement if needed      Relevant Orders   VITAMIN D  25 Hydroxy (Vit-D Deficiency, Fractures)   Spinal stenosis, lumbar - Primary    Refer to ortho      Relevant Orders  Ambulatory referral to Orthopedic Surgery   Medication monitoring encounter   Relevant Orders   Comprehensive metabolic panel   Obesity    Encouraged modest weight loss with decreased portions, limiting soda, etc.       Other Visit Diagnoses    Needs flu shot        Encounter for immunization            Follow up plan: Return in about 3 months (around 04/08/2015).  Orders Placed This Encounter  Procedures  . CT CHEST LUNG CA SCREEN LOW DOSE W/O CM  . Flu Vaccine QUAD 36+ mos IM  . Lipid Panel w/o Chol/HDL Ratio  . TSH  . VITAMIN D 25 Hydroxy (Vit-D Deficiency, Fractures)  . Comprehensive metabolic panel  . Ambulatory referral to Orthopedic Surgery   An after-visit summary was printed and given to the patient at [facility].  Please see the patient instructions which may contain other information and recommendations beyond what is mentioned above in the assessment and plan.

## 2015-01-08 NOTE — Assessment & Plan Note (Addendum)
Smoking cessation encouraged; he is aware of the health consequences of smoking like heart attack, emphysema, leukemia, stroke, see AVS; explained low dose chest CT for lung cancer screening, recommended, ordered; he wants to wait until February

## 2015-01-08 NOTE — Assessment & Plan Note (Signed)
Still going to the cancer center for this

## 2015-01-08 NOTE — Assessment & Plan Note (Signed)
Continue SSRI; patient does not want to see psychiatrist, does not want benzos

## 2015-01-09 ENCOUNTER — Encounter: Payer: Self-pay | Admitting: Family Medicine

## 2015-01-09 LAB — COMPREHENSIVE METABOLIC PANEL
A/G RATIO: 1.5 (ref 1.1–2.5)
ALBUMIN: 4.4 g/dL (ref 3.5–5.5)
ALT: 14 IU/L (ref 0–44)
AST: 20 IU/L (ref 0–40)
Alkaline Phosphatase: 95 IU/L (ref 39–117)
BILIRUBIN TOTAL: 0.3 mg/dL (ref 0.0–1.2)
BUN / CREAT RATIO: 12 (ref 9–20)
BUN: 13 mg/dL (ref 6–24)
CALCIUM: 9.8 mg/dL (ref 8.7–10.2)
CHLORIDE: 100 mmol/L (ref 96–106)
CO2: 23 mmol/L (ref 18–29)
Creatinine, Ser: 1.08 mg/dL (ref 0.76–1.27)
GFR, EST AFRICAN AMERICAN: 88 mL/min/{1.73_m2} (ref >59)
GFR, EST NON AFRICAN AMERICAN: 76 mL/min/{1.73_m2} (ref >59)
GLOBULIN, TOTAL: 3 g/dL (ref 1.5–4.5)
Glucose: 116 mg/dL — ABNORMAL HIGH (ref 65–99)
POTASSIUM: 5 mmol/L (ref 3.5–5.2)
SODIUM: 141 mmol/L (ref 134–144)
TOTAL PROTEIN: 7.4 g/dL (ref 6.0–8.5)

## 2015-01-09 LAB — LIPID PANEL W/O CHOL/HDL RATIO
Cholesterol, Total: 148 mg/dL (ref 100–199)
HDL: 32 mg/dL — AB (ref >39)
LDL Calculated: 90 mg/dL (ref 0–99)
TRIGLYCERIDES: 131 mg/dL (ref 0–149)
VLDL CHOLESTEROL CAL: 26 mg/dL (ref 5–40)

## 2015-01-09 LAB — TSH: TSH: 2.94 u[IU]/mL (ref 0.450–4.500)

## 2015-01-09 LAB — VITAMIN D 25 HYDROXY (VIT D DEFICIENCY, FRACTURES): VIT D 25 HYDROXY: 28.9 ng/mL — AB (ref 30.0–100.0)

## 2015-01-15 ENCOUNTER — Ambulatory Visit: Payer: Medicaid Other | Admitting: Gastroenterology

## 2015-01-16 ENCOUNTER — Telehealth: Payer: Self-pay | Admitting: *Deleted

## 2015-01-16 NOTE — Telephone Encounter (Signed)
Received referral for low dose lung cancer screening CT scan from Dr. Lada . Voicemail left at phone number listed in EMR for patient to call me back to facilitate scheduling scan.  

## 2015-02-05 ENCOUNTER — Other Ambulatory Visit: Payer: Self-pay | Admitting: Family Medicine

## 2015-02-05 ENCOUNTER — Inpatient Hospital Stay: Payer: Medicaid Other

## 2015-02-18 ENCOUNTER — Other Ambulatory Visit: Payer: Self-pay | Admitting: Unknown Physician Specialty

## 2015-02-18 NOTE — Telephone Encounter (Signed)
Your patient.  Thanks 

## 2015-02-18 NOTE — Telephone Encounter (Signed)
Labs from jan 2017 reviewed; Rx approved

## 2015-02-22 ENCOUNTER — Other Ambulatory Visit: Payer: Self-pay | Admitting: Unknown Physician Specialty

## 2015-02-22 NOTE — Telephone Encounter (Signed)
Jan 2017 sgpt and lipids reviewed; Rx was just approved on Feb 13th for 30+4 Please resolve with pharmacy

## 2015-02-22 NOTE — Telephone Encounter (Signed)
Your patient.  Thanks 

## 2015-02-25 NOTE — Telephone Encounter (Signed)
Called pharmacy and they stated that they did receive the prescription sent on 02/18/15 and it was ready for the patient to pick up.

## 2015-03-05 ENCOUNTER — Inpatient Hospital Stay: Payer: Medicaid Other

## 2015-03-07 ENCOUNTER — Inpatient Hospital Stay: Payer: Medicaid Other | Attending: Internal Medicine

## 2015-03-07 ENCOUNTER — Other Ambulatory Visit: Payer: Self-pay | Admitting: Unknown Physician Specialty

## 2015-03-07 ENCOUNTER — Inpatient Hospital Stay: Payer: Medicaid Other

## 2015-03-08 NOTE — Telephone Encounter (Signed)
I approved 30 pills + 4 refills Feb 18, 2015; please resolve with pharmacy

## 2015-03-08 NOTE — Telephone Encounter (Signed)
Your patient.  Thanks 

## 2015-03-08 NOTE — Telephone Encounter (Signed)
Pharmacy notified, they got the rx.

## 2015-04-02 ENCOUNTER — Inpatient Hospital Stay: Payer: Medicaid Other

## 2015-04-09 ENCOUNTER — Encounter: Payer: Self-pay | Admitting: Family Medicine

## 2015-04-09 ENCOUNTER — Encounter: Payer: Self-pay | Admitting: *Deleted

## 2015-04-09 ENCOUNTER — Ambulatory Visit (INDEPENDENT_AMBULATORY_CARE_PROVIDER_SITE_OTHER): Payer: Medicaid Other | Admitting: Family Medicine

## 2015-04-09 VITALS — BP 124/78 | HR 66 | Temp 98.5°F | Resp 14 | Wt 208.0 lb

## 2015-04-09 DIAGNOSIS — F410 Panic disorder [episodic paroxysmal anxiety] without agoraphobia: Secondary | ICD-10-CM

## 2015-04-09 DIAGNOSIS — E785 Hyperlipidemia, unspecified: Secondary | ICD-10-CM | POA: Diagnosis not present

## 2015-04-09 DIAGNOSIS — M4806 Spinal stenosis, lumbar region: Secondary | ICD-10-CM | POA: Diagnosis not present

## 2015-04-09 DIAGNOSIS — E038 Other specified hypothyroidism: Secondary | ICD-10-CM

## 2015-04-09 DIAGNOSIS — E669 Obesity, unspecified: Secondary | ICD-10-CM

## 2015-04-09 DIAGNOSIS — E559 Vitamin D deficiency, unspecified: Secondary | ICD-10-CM

## 2015-04-09 DIAGNOSIS — D751 Secondary polycythemia: Secondary | ICD-10-CM

## 2015-04-09 DIAGNOSIS — Z5181 Encounter for therapeutic drug level monitoring: Secondary | ICD-10-CM | POA: Diagnosis not present

## 2015-04-09 DIAGNOSIS — I10 Essential (primary) hypertension: Secondary | ICD-10-CM

## 2015-04-09 DIAGNOSIS — F419 Anxiety disorder, unspecified: Secondary | ICD-10-CM | POA: Diagnosis not present

## 2015-04-09 DIAGNOSIS — K429 Umbilical hernia without obstruction or gangrene: Secondary | ICD-10-CM | POA: Insufficient documentation

## 2015-04-09 DIAGNOSIS — M48061 Spinal stenosis, lumbar region without neurogenic claudication: Secondary | ICD-10-CM

## 2015-04-09 NOTE — Progress Notes (Signed)
BP 124/78 mmHg  Pulse 66  Temp(Src) 98.5 F (36.9 C) (Oral)  Resp 14  Wt 208 lb (94.348 kg)  SpO2 93%   Subjective:    Patient ID: Tyler Sox Sr., male    DOB: Mar 17, 1960, 55 y.o.   MRN: [id number]  HPI: Tyler Casucci. is a 55 y.o. male  Chief Complaint  Patient presents with  . Medication Refill  . Hypertension  . Hyperlipidemia  . Hypothyroidism  . Depression   Patient has been dealing with back pain; going on 13 years; worse lately after picking up granddaughter; seeing [facility]; had xrays and saw chiropractor too; has spinal stenosis and scoliosis and arthritis; using advil and patches for the discomfort  Depression; on SSRI; not seeing a therapist; no thoughts of self-harm; taking 20 mg BID; panic attacks too at times; stays at home sometimes because it is his safe zone; he does not want to see a psychiatrist right now  Depression screen [facility] 2/9 04/09/2015  Decreased Interest 0  Down, Depressed, Hopeless 1  PHQ - 2 Score 1   He has a rupture around his belly button; wants it checked out; he has had it for a while; someone else looked at it too; it has gotten puffy  Thyroid has been stable; has some nervous energy, but no diarrhea; no palpitations; weight has been stable, though he admits to being overweight; he has thought about losing weight, but not really trying; realizes he is 25-30 pounds overweight Lab Results  Component Value Date   TSH 2.940 01/08/2015    High cholesterol; eating what he wants; not limiting saturated fats; taking statin; no muscle aches or liver pain Lab Results  Component Value Date   CHOL 148 01/08/2015   Lab Results  Component Value Date   HDL 32* 01/08/2015   Lab Results  Component Value Date   LDLCALC 90 01/08/2015   Lab Results  Component Value Date   TRIG 131 01/08/2015   No results found for: CHOLHDL No results found for: LDLDIRECT  Mildly low vitamin D; taking 5000 iu daily  HTN; on BP medicine;  not checking BP at home, has a machine, but not sure if reliable so just doesn't check; does not use much salt on his food; last creatinine on Jan 08, 2015 was 1.08; last GFR was 76; last K+ was 5  Relevant past medical, surgical, family and social history reviewed and updated as indicated Past Medical History  Diagnosis Date  . AAT (alpha-1-antitrypsin) deficiency ([place])   . COPD (chronic obstructive pulmonary disease) ([place])   . Tobacco use   . Hyperlipidemia   . Hypertension   . Chronic low back pain   . TMJ (temporomandibular joint disorder)   . Gout   . Hypothyroidism   . Goiter   . Thyroid cyst     x 2  . GERD (gastroesophageal reflux disease)   . Depression   . Anxiety   . Vitamin D deficiency disease   . Insomnia   . Panic attacks    No past surgical history on file.   Family History  Problem Relation Age of Onset  . Heart disease Father   . Heart failure Father   . Heart attack Father     x 5  . Alcohol abuse Father   . COPD Father   . Heart attack Brother   . CAD Brother   . Hypertension Brother   . Alcohol abuse Paternal Grandfather   .  Cancer Neg Hx   . Diabetes Neg Hx   . Stroke Neg Hx    Interim medical history since our last visit reviewed. Allergies and medications reviewed and updated.  Review of Systems Per HPI unless specifically indicated above     Objective:    BP 124/78 mmHg  Pulse 66  Temp(Src) 98.5 F (36.9 C) (Oral)  Resp 14  Wt 208 lb (94.348 kg)  SpO2 93%  Wt Readings from Last 3 Encounters:  04/09/15 208 lb (94.348 kg)  01/08/15 210 lb (95.255 kg)  08/24/14 201 lb (91.173 kg)    Physical Exam  Constitutional: He appears well-developed and well-nourished.  obese  Eyes: EOM are normal. No scleral icterus.  Neck: No JVD present.  Cardiovascular: Normal rate and regular rhythm.   Pulmonary/Chest: Effort normal and breath sounds normal. No respiratory distress.  Abdominal: Soft. Bowel sounds are normal. He exhibits no  distension.  Musculoskeletal: Normal range of motion. He exhibits no edema.  Neurological: He is alert. He displays no tremor. Gait normal.  No tics  Skin: Skin is warm and dry. No pallor.  Psychiatric: His mood appears anxious. His affect is not blunt. His speech is not delayed. He is not slowed and not withdrawn. Cognition and memory are normal. He does not express impulsivity or inappropriate judgment. He does not exhibit a depressed mood.  Good eye contact with examiner; mildly anxious    Results for orders placed or performed in visit on 01/08/15  Hemoglobin and hematocrit, blood  Result Value Ref Range   Hemoglobin 16.1 13.0 - 18.0 g/dL   HCT 48.5 40.0 - 52.0 %      Assessment & Plan:   Problem List Items Addressed This Visit      Cardiovascular and Mediastinum   Hypertension    Well-controlled today; continue ACE-I/thiazide combination; DASH guidelines; weight management        Endocrine   Hypothyroidism    Last TSH normal in January 2017; next TSH due January 2018 (or thereabouts), sooner if change in weight, energy, bowel habits, etc.        Other   Secondary erythrocytosis    Monitored by hematologist; smoking postulated as etiology      Hyperlipidemia    Continue statin; last lipids reviewed; HDL low, smoking cessation and weight loss and activity would likely help raise that      Anxiety    Continue SSRI; patient declines offer to see psychiatrist or therapist; patient is now OFF of benzo and I do not intend to put him back on      Vitamin D deficiency disease    Vit D is January very mildly below normal; supplement      Panic attacks    Continue SSRI; patient does not want to see a therapist or psychiatrist      Spinal stenosis, lumbar    Turmeric, tylenol per package directions; I do not intend to start this patient on narcotics      Medication monitoring encounter    Last creatinine and K+ reviewed (on ACE-I/thiazide); last SGPT reviewed, all  done in January 2017; next labs due on or after July 09, 2015      Obesity    Discussed with patient; he is aware he could stand to lose 20-30 pounds; encouragement given; smaller portions, smart eating      Umbilical hernia - Primary    Reasons to see immediate medical attention reviewed; refer to general surgeon  Relevant Orders   Ambulatory referral to General Surgery       Follow up plan: Return in about 3 months (around 07/10/2015) for fasting labs and visit with Dr. Sanda Klein.  Orders Placed This Encounter  Procedures  . Ambulatory referral to General Surgery

## 2015-04-09 NOTE — Assessment & Plan Note (Signed)
Reasons to see immediate medical attention reviewed; refer to general surgeon

## 2015-04-09 NOTE — Patient Instructions (Addendum)
Take the 5000 iu vitamin D3 just two days per week Return for fasting labs on or after July 5th Try to lose 6-12 pounds before your next visit Try to limit saturated fats in your diet (bologna, hot dogs, barbeque, cheeseburgers, hamburgers, steak, bacon, sausage, cheese, etc.) and get more fresh fruits, vegetables, and whole grains

## 2015-04-14 NOTE — Assessment & Plan Note (Signed)
Monitored by hematologist; smoking postulated as etiology

## 2015-04-14 NOTE — Assessment & Plan Note (Signed)
Last TSH normal in January 2017; next TSH due January 2018 (or thereabouts), sooner if change in weight, energy, bowel habits, etc.

## 2015-04-14 NOTE — Assessment & Plan Note (Signed)
Vit D is January very mildly below normal; supplement

## 2015-04-14 NOTE — Assessment & Plan Note (Signed)
Discussed with patient; he is aware he could stand to lose 20-30 pounds; encouragement given; smaller portions, smart eating

## 2015-04-14 NOTE — Assessment & Plan Note (Signed)
Continue SSRI; patient does not want to see a therapist or psychiatrist

## 2015-04-14 NOTE — Assessment & Plan Note (Signed)
Last creatinine and K+ reviewed (on ACE-I/thiazide); last SGPT reviewed, all done in January 2017; next labs due on or after July 09, 2015

## 2015-04-14 NOTE — Assessment & Plan Note (Addendum)
Continue SSRI; patient declines offer to see psychiatrist or therapist; patient is now OFF of benzo and I do not intend to put him back on

## 2015-04-14 NOTE — Assessment & Plan Note (Signed)
Turmeric, tylenol per package directions; I do not intend to start this patient on narcotics

## 2015-04-14 NOTE — Assessment & Plan Note (Signed)
Continue statin; last lipids reviewed; HDL low, smoking cessation and weight loss and activity would likely help raise that

## 2015-04-14 NOTE — Assessment & Plan Note (Signed)
Well-controlled today; continue ACE-I/thiazide combination; DASH guidelines; weight management

## 2015-04-22 ENCOUNTER — Encounter: Payer: Self-pay | Admitting: General Surgery

## 2015-04-22 ENCOUNTER — Ambulatory Visit (INDEPENDENT_AMBULATORY_CARE_PROVIDER_SITE_OTHER): Payer: Medicaid Other | Admitting: General Surgery

## 2015-04-22 VITALS — BP 142/84 | HR 74 | Resp 14 | Ht 70.0 in | Wt 207.0 lb

## 2015-04-22 DIAGNOSIS — K429 Umbilical hernia without obstruction or gangrene: Secondary | ICD-10-CM | POA: Diagnosis not present

## 2015-04-22 NOTE — H&P (Signed)
HPI  Tyler Koss Sr. is a 55 y.o. male here today for an evaluation of an umbilical hernia. He states the hernia has been present for years. It has recently started bothering him, It does "pop" out at times and goes in when laying down. Moves bowels regularly. The patient reports while there has not been a change in size he is become more aware of the area.  HPI  Past Medical History   Diagnosis  Date   .  AAT (alpha-1-antitrypsin) deficiency ([place])    .  COPD (chronic obstructive pulmonary disease) ([place])    .  Tobacco use    .  Hyperlipidemia    .  Hypertension    .  Chronic low back pain    .  TMJ (temporomandibular joint disorder)    .  Gout    .  Hypothyroidism    .  Goiter    .  Thyroid cyst      x 2   .  GERD (gastroesophageal reflux disease)    .  Depression    .  Anxiety    .  Vitamin D deficiency disease    .  Insomnia    .  Panic attacks     Past Surgical History   Procedure  Laterality  Date   .  Colonoscopy   2013     Dr. Vira Agar    Family History   Problem  Relation  Age of Onset   .  Heart disease  Father    .  Heart failure  Father    .  Heart attack  Father      x 5   .  Alcohol abuse  Father    .  COPD  Father    .  Heart attack  Brother    .  CAD  Brother    .  Hypertension  Brother    .  Alcohol abuse  Paternal Grandfather    .  Cancer  Neg Hx    .  Diabetes  Neg Hx    .  Stroke  Neg Hx     Social History  Social History   Substance Use Topics   .  Smoking status:  Current Every Day Smoker -- 1.00 packs/day for 40 years     Types:  Cigarettes   .  Smokeless tobacco:  Never Used   .  Alcohol Use:  No    Allergies   Allergen  Reactions   .  No Known Allergies     Current Outpatient Prescriptions   Medication  Sig  Dispense  Refill   .  levothyroxine (SYNTHROID, LEVOTHROID) 100 MCG tablet  TAKE 1 TABLET BY MOUTH ONCE A DAY  30 tablet  8   .  lisinopril-hydrochlorothiazide (PRINZIDE,ZESTORETIC) 20-12.5 MG per tablet  TAKE 1 AND 1/2  TABLET BY MOUTH EVERY DAY  45 tablet  7   .  PARoxetine (PAXIL) 20 MG tablet  TAKE 1 TABLET BY MOUTH TWICE A DAY  60 tablet  1   .  simvastatin (ZOCOR) 20 MG tablet  Take 1 tablet (20 mg total) by mouth at bedtime.  30 tablet  4    No current facility-administered medications for this visit.    Review of Systems  Review of Systems  Constitutional: Negative.  Respiratory: Negative.  Cardiovascular: Negative.   Blood pressure 142/84, pulse 74, resp. rate 14, height 5\' 10"  (1.778 m), weight 207 lb (93.895 kg).  Physical  Exam  Physical Exam  Constitutional: He is oriented to person, place, and time. He appears well-developed and well-nourished.  Eyes: Conjunctivae are normal. No scleral icterus.  Neck: Neck supple. No thyromegaly present.  Cardiovascular: Normal rate, regular rhythm and normal heart sounds.  Pulmonary/Chest: Effort normal and breath sounds normal.  Abdominal: Soft. Bowel sounds are normal. A hernia (1 cm reducible umbilical) is present.  Lymphadenopathy:  He has no cervical adenopathy.  Neurological: He is alert and oriented to person, place, and time.  Skin: Skin is warm and dry.   Data Reviewed  November 2016 through January 2017 laboratory studies removed. Elevated serum hemoglobin is 16.5. Minimal elevation of the fasting blood sugar.  Assessment   Umbilical hernia   Plan   Hernia precautions and incarceration were discussed with the patient. If they develop symptoms of an incarcerated hernia, they were encouraged to seek prompt medical attention.  I have recommended repair of the hernia. The risk of infection was reviewed.  Aware to have a driver on day of surgery, when pain free he is allowed to drive.   Patient's surgery has been scheduled for 05-06-15 at [facility].  This information has been scribed by Verlene Mayer, CMA  PCP: Dr. Retia Passe, [place] [facility]  04/22/2015, 8:26 PM

## 2015-04-22 NOTE — Patient Instructions (Addendum)
Umbilical Herniorrhaphy Herniorrhaphy is surgery to repair a hernia. A hernia is the protrusion of a part of an organ through an abdominal opening. An umbilical hernia means that your hernia is in the area around your navel. If the hernia is not repaired, the gap could get bigger. Your intestines or other tissues, such as fat, could get trapped in the gap. This can lead to other health problems, such as blocked intestines. If the hernia is fixed before problems set in, you may be allowed to go home the same day as the surgery (outpatient). LET [facility] CARE PROVIDER KNOW ABOUT:  Allergies to food or medicine.  Medicines taken, including vitamins, herbs, eye drops, over-the-counter medicines, and creams.  Use of steroids (by mouth or creams).  Previous problems with anesthetics or numbing medicines.  History of bleeding problems or blood clots.  Previous surgery.  Other health problems, including diabetes and kidney problems.  Possibility of pregnancy, if this applies. RISKS AND COMPLICATIONS  Pain.  Excessive bleeding.  Hematoma. This is a pocket of blood that collects under the surgery site.  Infection at the surgery site.  Numbness at the surgery site.  Swelling and bruising.  Blood clots.  Intestinal damage (rare).  Scarring.  Skin damage.  Development of another hernia. This may require another surgery. BEFORE THE PROCEDURE  Ask your health care provider about changing or stopping your regular medicines. You may need to stop taking aspirin, nonsteroidal anti-inflammatory drugs (NSAIDs), vitamin E, and blood thinners as early as 2 weeks before the procedure.  Do not eat or drink for 8 hours before the procedure, or as directed by your health care provider.  You might be asked to shower or wash with an antibacterial soap before the procedure.  Wear comfortable clothes that will be easy to put on after the procedure. PROCEDURE You will be given an intravenous  (IV) tube. A needle will be inserted in your arm. Medicine will flow directly into your body through this needle. You might be given medicine to help you relax (sedative). You will be given medicine that numbs the area (local anesthetic) or medicine that makes you sleep (general anesthetic). If you have open surgery:  The surgeon will make a cut (incision) in your abdomen.  The gap in the muscle wall will be repaired. The surgeon may sew the edges together over the gap or use a mesh material to strengthen the area. When mesh is used, the body grows new, strong tissue into and around it. This new tissue closes the gap.  A drain might be put in to remove excess fluid from the body after surgery.  The surgeon will close the incision with stitches, glue, or staples. If you have laparoscopic surgery:  The surgeon will make several small incisions in your abdomen.  A thin, lighted tube (laparoscope) will be inserted into the abdomen through an incision. A camera is attached to the laparoscope that allows the surgeon to see inside the abdomen.  Tools will be inserted through the other incisions to repair the hernia. Usually, mesh is used to cover the gap.  The surgeon will close the incisions with stitches. AFTER THE PROCEDURE  You will be taken to a recovery area. A nurse will watch and check your progress.  When you are awake, feeling well, and taking fluids well, you may be allowed to go home. In some cases, you may need to stay overnight in the hospital.  Arrange for someone to drive you home.  This information is not intended to replace advice given to you by your health care provider. Make sure you discuss any questions you have with your health care provider.   Document Released: 03/20/2008 Document Revised: 01/12/2014 Document Reviewed: 03/25/2011 Elsevier Interactive Patient Education 2016 [employer].  Patient's surgery has been scheduled for 05-06-15 at [facility].

## 2015-04-22 NOTE — Progress Notes (Signed)
Patient ID: Tyler Cobb., male   DOB: 08/24/1960, 55 y.o.   MRN: [id number]  Chief Complaint  Patient presents with  . Other    umbilical hernia    HPI Tyler Cobb. is a 55 y.o. male here today for an evaluation of an umbilical hernia. He states the hernia has been present for years. It has recently started bothering him, It does "pop" out at times and goes in when laying down. Moves bowels regularly. The patient reports while there has not been a change in size he is become more aware of the area. HPI  Past Medical History  Diagnosis Date  . AAT (alpha-1-antitrypsin) deficiency ([place])   . COPD (chronic obstructive pulmonary disease) ([place])   . Tobacco use   . Hyperlipidemia   . Hypertension   . Chronic low back pain   . TMJ (temporomandibular joint disorder)   . Gout   . Hypothyroidism   . Goiter   . Thyroid cyst     x 2  . GERD (gastroesophageal reflux disease)   . Depression   . Anxiety   . Vitamin D deficiency disease   . Insomnia   . Panic attacks     Past Surgical History  Procedure Laterality Date  . Colonoscopy  2013    Dr. Vira Agar    Family History  Problem Relation Age of Onset  . Heart disease Father   . Heart failure Father   . Heart attack Father     x 5  . Alcohol abuse Father   . COPD Father   . Heart attack Brother   . CAD Brother   . Hypertension Brother   . Alcohol abuse Paternal Grandfather   . Cancer Neg Hx   . Diabetes Neg Hx   . Stroke Neg Hx     Social History Social History  Substance Use Topics  . Smoking status: Current Every Day Smoker -- 1.00 packs/day for 40 years    Types: Cigarettes  . Smokeless tobacco: Never Used  . Alcohol Use: No    Allergies  Allergen Reactions  . No Known Allergies     Current Outpatient Prescriptions  Medication Sig Dispense Refill  . levothyroxine (SYNTHROID, LEVOTHROID) 100 MCG tablet TAKE 1 TABLET BY MOUTH ONCE A DAY 30 tablet 8  . lisinopril-hydrochlorothiazide  (PRINZIDE,ZESTORETIC) 20-12.5 MG per tablet TAKE 1 AND 1/2 TABLET BY MOUTH EVERY DAY 45 tablet 7  . PARoxetine (PAXIL) 20 MG tablet TAKE 1 TABLET BY MOUTH TWICE A DAY 60 tablet 1  . simvastatin (ZOCOR) 20 MG tablet Take 1 tablet (20 mg total) by mouth at bedtime. 30 tablet 4   No current facility-administered medications for this visit.    Review of Systems Review of Systems  Constitutional: Negative.   Respiratory: Negative.   Cardiovascular: Negative.     Blood pressure 142/84, pulse 74, resp. rate 14, height 5\' 10"  (1.778 m), weight 207 lb (93.895 kg).  Physical Exam Physical Exam  Constitutional: He is oriented to person, place, and time. He appears well-developed and well-nourished.  Eyes: Conjunctivae are normal. No scleral icterus.  Neck: Neck supple. No thyromegaly present.  Cardiovascular: Normal rate, regular rhythm and normal heart sounds.   Pulmonary/Chest: Effort normal and breath sounds normal.  Abdominal: Soft. Bowel sounds are normal. A hernia (1 cm reducible umbilical) is present.  Lymphadenopathy:    He has no cervical adenopathy.  Neurological: He is alert and oriented to person, place, and time.  Skin: Skin is warm and dry.    Data Reviewed November 2016 through January 2017 laboratory studies removed. Elevated serum hemoglobin is 16.5. Minimal elevation of the fasting blood sugar.  Assessment    Umbilical hernia    Plan      Hernia precautions and incarceration were discussed with the patient. If they develop symptoms of an incarcerated hernia, they were encouraged to seek prompt medical attention. I have recommended repair of the hernia. The risk of infection was reviewed.  Aware to have a driver on day of surgery, when pain free he is allowed to drive.     Patient's surgery has been scheduled for 05-06-15 at [facility].  This information has been scribed by Verlene Mayer, CMA    PCP: Dr. Retia Passe, [place] [facility] 04/22/2015, 8:26 PM

## 2015-04-29 ENCOUNTER — Encounter: Payer: Self-pay | Admitting: *Deleted

## 2015-04-29 ENCOUNTER — Other Ambulatory Visit: Payer: Medicaid Other

## 2015-04-29 NOTE — Patient Instructions (Signed)
  Your procedure is scheduled on: 05-06-15 (MONDAY) Report to [place]. To find out your arrival time please call [phone] between 1PM - 3PM on 05-03-15 (FRIDAY)  Remember: Instructions that are not followed completely may result in serious medical risk, up to and including death, or upon the discretion of your surgeon and anesthesiologist your surgery may need to be rescheduled.    _X___ 1. Do not eat food or drink liquids after midnight. No gum chewing or hard candies.     _X___ 2. No Alcohol for 24 hours before or after surgery.   ____ 3. Bring all medications with you on the day of surgery if instructed.    ____ 4. Notify your doctor if there is any change in your medical condition     (cold, fever, infections).     Do not wear jewelry, make-up, hairpins, clips or nail polish.  Do not wear lotions, powders, or perfumes. You may wear deodorant.  Do not shave 48 hours prior to surgery. Men may shave face and neck.  Do not bring valuables to the hospital.    [facility] is not responsible for any belongings or valuables.               Contacts, dentures or bridgework may not be worn into surgery.  Leave your suitcase in the car. After surgery it may be brought to your room.  For patients admitted to the hospital, discharge time is determined by your treatment team.   Patients discharged the day of surgery will not be allowed to drive home.   Please read over the following fact sheets that you were given:      _X___ Take these medicines the morning of surgery with A SIP OF WATER:    1. LEVOTHYROXINE (SYNTHROID)  2. PAROXETINE (PAXIL)  3.   4.  5.  6.  ____ Fleet Enema (as directed)   _X___ Use CHG Soap as directed  ____ Use inhalers on the day of surgery  ____ Stop metformin 2 days prior to surgery    ____ Take 1/2 of usual insulin dose the night before surgery and none on the morning of surgery.   ____ Stop  Coumadin/Plavix/aspirin-N/A  ____ Stop Anti-inflammatories-NO NSAIDS OR ASPIRIN PRODUCTS-TYLENOL OK TO TAKE   ____ Stop supplements until after surgery.    ____ Bring C-Pap to the hospital.

## 2015-04-30 ENCOUNTER — Inpatient Hospital Stay: Admission: RE | Admit: 2015-04-30 | Payer: Medicaid Other | Source: Ambulatory Visit

## 2015-05-01 ENCOUNTER — Telehealth: Payer: Self-pay | Admitting: *Deleted

## 2015-05-01 ENCOUNTER — Inpatient Hospital Stay: Admission: RE | Admit: 2015-05-01 | Payer: Medicaid Other | Source: Ambulatory Visit

## 2015-05-01 NOTE — Telephone Encounter (Signed)
Patient contacted the office today and has decided to cancel umbilical hernia repair that was scheduled for 05-06-15 at [facility]   This patient states that he thinks there are too many risks involved at this time.   Heather in the Pre-admission Department notified of this.  Surgery cancellation form also faxed to the [facility] and message left on posting line to notify them of cancellation.

## 2015-05-06 ENCOUNTER — Ambulatory Visit: Admission: RE | Admit: 2015-05-06 | Payer: Medicaid Other | Source: Ambulatory Visit | Admitting: General Surgery

## 2015-05-06 HISTORY — DX: Unspecified osteoarthritis, unspecified site: M19.90

## 2015-05-06 SURGERY — REPAIR, HERNIA, UMBILICAL, ADULT
Anesthesia: Choice

## 2015-05-13 ENCOUNTER — Inpatient Hospital Stay (HOSPITAL_BASED_OUTPATIENT_CLINIC_OR_DEPARTMENT_OTHER): Payer: Medicaid Other | Admitting: Internal Medicine

## 2015-05-13 ENCOUNTER — Inpatient Hospital Stay: Payer: Medicaid Other | Attending: Internal Medicine

## 2015-05-13 ENCOUNTER — Inpatient Hospital Stay: Payer: Medicaid Other

## 2015-05-13 VITALS — BP 133/79 | HR 66 | Temp 97.5°F | Resp 18 | Wt 206.6 lb

## 2015-05-13 VITALS — BP 145/65 | HR 63

## 2015-05-13 DIAGNOSIS — F17210 Nicotine dependence, cigarettes, uncomplicated: Secondary | ICD-10-CM | POA: Diagnosis not present

## 2015-05-13 DIAGNOSIS — M545 Low back pain: Secondary | ICD-10-CM

## 2015-05-13 DIAGNOSIS — D751 Secondary polycythemia: Secondary | ICD-10-CM

## 2015-05-13 DIAGNOSIS — Z79899 Other long term (current) drug therapy: Secondary | ICD-10-CM | POA: Diagnosis not present

## 2015-05-13 DIAGNOSIS — M109 Gout, unspecified: Secondary | ICD-10-CM | POA: Insufficient documentation

## 2015-05-13 DIAGNOSIS — E785 Hyperlipidemia, unspecified: Secondary | ICD-10-CM | POA: Insufficient documentation

## 2015-05-13 DIAGNOSIS — I10 Essential (primary) hypertension: Secondary | ICD-10-CM

## 2015-05-13 DIAGNOSIS — E039 Hypothyroidism, unspecified: Secondary | ICD-10-CM

## 2015-05-13 DIAGNOSIS — R51 Headache: Secondary | ICD-10-CM | POA: Insufficient documentation

## 2015-05-13 DIAGNOSIS — R5382 Chronic fatigue, unspecified: Secondary | ICD-10-CM

## 2015-05-13 DIAGNOSIS — E559 Vitamin D deficiency, unspecified: Secondary | ICD-10-CM | POA: Insufficient documentation

## 2015-05-13 DIAGNOSIS — F418 Other specified anxiety disorders: Secondary | ICD-10-CM | POA: Diagnosis not present

## 2015-05-13 DIAGNOSIS — M1990 Unspecified osteoarthritis, unspecified site: Secondary | ICD-10-CM | POA: Insufficient documentation

## 2015-05-13 DIAGNOSIS — G8929 Other chronic pain: Secondary | ICD-10-CM | POA: Diagnosis not present

## 2015-05-13 LAB — CBC WITH DIFFERENTIAL/PLATELET
BASOS ABS: 0.1 10*3/uL (ref 0–0.1)
Basophils Relative: 1 %
EOS ABS: 0.3 10*3/uL (ref 0–0.7)
EOS PCT: 3 %
HCT: 50 % (ref 40.0–52.0)
HEMOGLOBIN: 17.2 g/dL (ref 13.0–18.0)
LYMPHS PCT: 24 %
Lymphs Abs: 2.3 10*3/uL (ref 1.0–3.6)
MCH: 28.7 pg (ref 26.0–34.0)
MCHC: 34.4 g/dL (ref 32.0–36.0)
MCV: 83.7 fL (ref 80.0–100.0)
Monocytes Absolute: 0.6 10*3/uL (ref 0.2–1.0)
Monocytes Relative: 6 %
NEUTROS PCT: 66 %
Neutro Abs: 6.4 10*3/uL (ref 1.4–6.5)
PLATELETS: 364 10*3/uL (ref 150–440)
RBC: 5.98 MIL/uL — AB (ref 4.40–5.90)
RDW: 15.8 % — ABNORMAL HIGH (ref 11.5–14.5)
WBC: 9.6 10*3/uL (ref 3.8–10.6)

## 2015-05-13 NOTE — Progress Notes (Signed)
Patient states he has not been here in four months.  States he got tired of getting his phlebotomies.

## 2015-05-13 NOTE — Progress Notes (Signed)
[facility] OFFICE PROGRESS NOTE  Patient Care Team: Arnetha Courser, MD as PCP - General (Family Medicine) Arnetha Courser, MD as Attending Physician (Family Medicine) Robert Bellow, MD (General Surgery)   SUMMARY OF HEMATOLOGIC/ONCOLOGIC HISTORY:  # 2016 ERYTHROCYTOSIS- JAK-2 NEG; Nov 2015] [HCT at presentation- 55] Phlebotomy if HCT >48 [Bcr-abl-NEG/flowcytometry-Neg]   # SMOKING-     INTERVAL HISTORY:  This is my first interaction with the patient since I joined the practice September 2016. I reviewed the patient's prior charts/pertinent labs/imaging in detail; findings are summarized above.   A very pleasant 55 year old male patient with above history of smoking and history of erythrocytosis currently on intermittent phlebotomies for hematocrit greater than 48 is here for follow-up.  Patient has chronic fatigue chronic intermittent headaches. He admits that this is not significantly improved post phlebotomies.  Otherwise he denies any unusual shortness of breath or cough or chest pain. Denies any swelling in the legs or joint pains.   REVIEW OF SYSTEMS:  A complete 10 point review of system is done which is negative except mentioned above/history of present illness.   PAST MEDICAL HISTORY :  Past Medical History  Diagnosis Date  . AAT (alpha-1-antitrypsin) deficiency ([place])   . COPD (chronic obstructive pulmonary disease) ([place])   . Tobacco use   . Hyperlipidemia   . Hypertension   . Chronic low back pain   . TMJ (temporomandibular joint disorder)   . Gout   . Hypothyroidism   . Goiter   . Thyroid cyst     x 2  . Depression   . Anxiety   . Vitamin D deficiency disease   . Insomnia   . Panic attacks   . Arthritis     SPINE    PAST SURGICAL HISTORY :   Past Surgical History  Procedure Laterality Date  . Colonoscopy  2013    Dr. Orest Dikes 2    FAMILY HISTORY :   Family History  Problem Relation Age of Onset  . Heart disease Father   .  Heart failure Father   . Heart attack Father     x 5  . Alcohol abuse Father   . COPD Father   . Heart attack Brother   . CAD Brother   . Hypertension Brother   . Alcohol abuse Paternal Grandfather   . Cancer Neg Hx   . Diabetes Neg Hx   . Stroke Neg Hx     SOCIAL HISTORY:   Social History  Substance Use Topics  . Smoking status: Current Every Day Smoker -- 1.00 packs/day for 40 years    Types: Cigarettes  . Smokeless tobacco: Never Used  . Alcohol Use: No    ALLERGIES:  is allergic to no known allergies.  MEDICATIONS:  Current Outpatient Prescriptions  Medication Sig Dispense Refill  . levothyroxine (SYNTHROID, LEVOTHROID) 100 MCG tablet TAKE 1 TABLET BY MOUTH ONCE A DAY (Patient taking differently: TAKE 1 TABLET BY MOUTH ONCE A DAY-AM) 30 tablet 8  . lisinopril-hydrochlorothiazide (PRINZIDE,ZESTORETIC) 20-12.5 MG per tablet TAKE 1 AND 1/2 TABLET BY MOUTH EVERY DAY 45 tablet 7  . PARoxetine (PAXIL) 20 MG tablet TAKE 1 TABLET BY MOUTH TWICE A DAY 60 tablet 1  . simvastatin (ZOCOR) 20 MG tablet Take 1 tablet (20 mg total) by mouth at bedtime. 30 tablet 4   No current facility-administered medications for this visit.    PHYSICAL EXAMINATION:   BP 133/79 mmHg  Pulse 66  Temp(Src)  97.5 F (36.4 C) (Tympanic)  Resp 18  Wt 206 lb 9.1 oz (93.7 kg)  Filed Weights   05/13/15 1043  Weight: 206 lb 9.1 oz (93.7 kg)    GENERAL: Well-nourished well-developed; Alert, no distress and comfortable. Alone. EYES: no pallor or icterus OROPHARYNX: no thrush or ulceration.  NECK: supple, no masses felt LYMPH:  no palpable lymphadenopathy in the cervical, axillary or inguinal regions LUNGS: clear to auscultation and  No wheeze or crackles HEART/CVS: regular rate & rhythm and no murmurs; No lower extremity edema ABDOMEN:abdomen soft, non-tender and normal bowel sounds Musculoskeletal:no cyanosis of digits and no clubbing  PSYCH: alert & oriented x 3 with fluent speech NEURO: no  focal motor/sensory deficits SKIN:  no rashes or significant lesions  LABORATORY DATA:  I have reviewed the data as listed    Component Value Date/Time   NA 141 01/08/2015 1115   K 5.0 01/08/2015 1115   CL 100 01/08/2015 1115   CO2 23 01/08/2015 1115   GLUCOSE 116* 01/08/2015 1115   BUN 13 01/08/2015 1115   CREATININE 1.08 01/08/2015 1115   CALCIUM 9.8 01/08/2015 1115   PROT 7.4 01/08/2015 1115   ALBUMIN 4.4 01/08/2015 1115   AST 20 01/08/2015 1115   ALT 14 01/08/2015 1115   ALKPHOS 95 01/08/2015 1115   BILITOT 0.3 01/08/2015 1115   GFRNONAA 76 01/08/2015 1115   GFRAA 88 01/08/2015 1115    No results found for: SPEP, UPEP  Lab Results  Component Value Date   WBC 9.6 05/13/2015   NEUTROABS 6.4 05/13/2015   HGB 17.2 05/13/2015   HCT 50.0 05/13/2015   MCV 83.7 05/13/2015   PLT 364 05/13/2015      Chemistry      Component Value Date/Time   NA 141 01/08/2015 1115   K 5.0 01/08/2015 1115   CL 100 01/08/2015 1115   CO2 23 01/08/2015 1115   BUN 13 01/08/2015 1115   CREATININE 1.08 01/08/2015 1115      Component Value Date/Time   CALCIUM 9.8 01/08/2015 1115   ALKPHOS 95 01/08/2015 1115   AST 20 01/08/2015 1115   ALT 14 01/08/2015 1115   BILITOT 0.3 01/08/2015 1115        ASSESSMENT & PLAN:   # ERYTHROCYTOSIS- Jak-2 NEG; Likely secondary. Patient does not have any significant improvement of his symptoms after phlebotomy. However proceed with phlebotomy today as hematocrit is 50.5 today. However, space out H&H/ possible phlebotomy to every 6 months. This was discussed the patient detail.  # Smoking consultation the patient regarding smoking. He'll follow up with PCP regarding smoking cessation.  All questions were answered. The patient knows to call the clinic with any problems, questions or concerns.      Cammie Sickle, MD 05/13/2015 11:06 AM

## 2015-06-24 ENCOUNTER — Other Ambulatory Visit: Payer: Self-pay

## 2015-06-25 MED ORDER — PAROXETINE HCL 20 MG PO TABS: 20.0000 mg | ORAL_TABLET | Freq: Two times a day (BID) | ORAL | Status: DC

## 2015-08-05 ENCOUNTER — Other Ambulatory Visit: Payer: Self-pay

## 2015-08-06 MED ORDER — LEVOTHYROXINE SODIUM 100 MCG PO TABS: 100.0000 ug | ORAL_TABLET | Freq: Every day | ORAL | 5 refills | Status: DC

## 2015-08-06 NOTE — Telephone Encounter (Signed)
Normal TSH Jan 2017; Rx approved

## 2015-08-17 ENCOUNTER — Other Ambulatory Visit: Payer: Self-pay | Admitting: Family Medicine

## 2015-08-18 NOTE — Telephone Encounter (Signed)
Last cmp reviewed; he has appt later this month; Rx approved

## 2015-08-27 ENCOUNTER — Other Ambulatory Visit: Payer: Self-pay | Admitting: Family Medicine

## 2015-08-27 NOTE — Telephone Encounter (Signed)
Last sgpt and lipids reviewed; rx approved 

## 2015-09-04 ENCOUNTER — Encounter: Payer: Self-pay | Admitting: Family Medicine

## 2015-09-04 ENCOUNTER — Ambulatory Visit (INDEPENDENT_AMBULATORY_CARE_PROVIDER_SITE_OTHER): Payer: Medicaid Other | Admitting: Family Medicine

## 2015-09-04 VITALS — BP 122/82 | HR 72 | Temp 98.3°F | Resp 18 | Ht 70.0 in | Wt 203.2 lb

## 2015-09-04 DIAGNOSIS — Z23 Encounter for immunization: Secondary | ICD-10-CM

## 2015-09-04 DIAGNOSIS — E038 Other specified hypothyroidism: Secondary | ICD-10-CM

## 2015-09-04 DIAGNOSIS — I10 Essential (primary) hypertension: Secondary | ICD-10-CM

## 2015-09-04 DIAGNOSIS — D751 Secondary polycythemia: Secondary | ICD-10-CM | POA: Diagnosis not present

## 2015-09-04 DIAGNOSIS — R739 Hyperglycemia, unspecified: Secondary | ICD-10-CM

## 2015-09-04 DIAGNOSIS — Z5181 Encounter for therapeutic drug level monitoring: Secondary | ICD-10-CM | POA: Diagnosis not present

## 2015-09-04 DIAGNOSIS — Z720 Tobacco use: Secondary | ICD-10-CM | POA: Diagnosis not present

## 2015-09-04 DIAGNOSIS — E785 Hyperlipidemia, unspecified: Secondary | ICD-10-CM

## 2015-09-04 DIAGNOSIS — F410 Panic disorder [episodic paroxysmal anxiety] without agoraphobia: Secondary | ICD-10-CM

## 2015-09-04 DIAGNOSIS — F419 Anxiety disorder, unspecified: Secondary | ICD-10-CM | POA: Diagnosis not present

## 2015-09-04 DIAGNOSIS — E559 Vitamin D deficiency, unspecified: Secondary | ICD-10-CM

## 2015-09-04 LAB — LIPID PANEL
CHOLESTEROL: 199 mg/dL (ref 125–200)
HDL: 32 mg/dL — ABNORMAL LOW (ref >=40)
LDL Cholesterol: 142 mg/dL — ABNORMAL HIGH (ref <130)
Total CHOL/HDL Ratio: 6.2 Ratio — ABNORMAL HIGH (ref <=5.0)
Triglycerides: 125 mg/dL (ref <150)
VLDL: 25 mg/dL (ref <30)

## 2015-09-04 LAB — CBC WITH DIFFERENTIAL/PLATELET
Basophils Absolute: 98 {cells}/uL (ref 0–200)
Basophils Relative: 1 %
Eosinophils Absolute: 294 {cells}/uL (ref 15–500)
Eosinophils Relative: 3 %
HCT: 53.5 % — ABNORMAL HIGH (ref 38.5–50.0)
Hemoglobin: 18.5 g/dL — ABNORMAL HIGH (ref 13.2–17.1)
Lymphocytes Relative: 25 %
Lymphs Abs: 2450 {cells}/uL (ref 850–3900)
MCH: 29.5 pg (ref 27.0–33.0)
MCHC: 34.6 g/dL (ref 32.0–36.0)
MCV: 85.3 fL (ref 80.0–100.0)
MPV: 10.3 fL (ref 7.5–12.5)
Monocytes Absolute: 686 {cells}/uL (ref 200–950)
Monocytes Relative: 7 %
Neutro Abs: 6272 {cells}/uL (ref 1500–7800)
Neutrophils Relative %: 64 %
Platelets: 367 10*3/uL (ref 140–400)
RBC: 6.27 MIL/uL — ABNORMAL HIGH (ref 4.20–5.80)
RDW: 15.1 % — ABNORMAL HIGH (ref 11.0–15.0)
WBC: 9.8 10*3/uL (ref 3.8–10.8)

## 2015-09-04 LAB — COMPLETE METABOLIC PANEL WITH GFR
ALBUMIN: 4.3 g/dL (ref 3.6–5.1)
ALK PHOS: 97 U/L (ref 40–115)
ALT: 20 U/L (ref 9–46)
AST: 21 U/L (ref 10–35)
BILIRUBIN TOTAL: 0.4 mg/dL (ref 0.2–1.2)
BUN: 12 mg/dL (ref 7–25)
CALCIUM: 9.9 mg/dL (ref 8.6–10.3)
CO2: 25 mmol/L (ref 20–31)
CREATININE: 1.1 mg/dL (ref 0.70–1.33)
Chloride: 98 mmol/L (ref 98–110)
GFR, Est African American: 86 mL/min (ref >=60)
GFR, Est Non African American: 74 mL/min (ref >=60)
Glucose, Bld: 95 mg/dL (ref 65–99)
POTASSIUM: 4.3 mmol/L (ref 3.5–5.3)
Sodium: 137 mmol/L (ref 135–146)
TOTAL PROTEIN: 8 g/dL (ref 6.1–8.1)

## 2015-09-04 LAB — HEMOGLOBIN A1C
Hgb A1c MFr Bld: 5.7 % — ABNORMAL HIGH
Mean Plasma Glucose: 117 mg/dL

## 2015-09-04 LAB — TSH: TSH: 1.91 mIU/L (ref 0.40–4.50)

## 2015-09-04 MED ORDER — PAROXETINE HCL 20 MG PO TABS: 20.0000 mg | ORAL_TABLET | Freq: Two times a day (BID) | ORAL | 6 refills | Status: DC

## 2015-09-04 NOTE — Assessment & Plan Note (Signed)
Continue SSRI, refill sent

## 2015-09-04 NOTE — Patient Instructions (Addendum)
You received the flu shot today; it should protect you against the flu virus over the coming months; it will take about two weeks for antibodies to develop; do try to stay away from hospitals, nursing homes, and daycares during peak flu season; taking extra vitamin C daily during flu season may help you avoid getting sick  I do encourage you to quit smoking Call [phone] to sign up for smoking cessation classes You can call 1-800-QUIT-NOW to talk with a smoking cessation coach  Try to limit saturated fats in your diet (bologna, hot dogs, barbeque, cheeseburgers, hamburgers, steak, bacon, sausage, cheese, etc.) and get more fresh fruits, vegetables, and whole grains  Check out the information at familydoctor.org entitled "Nutrition for Weight Loss: What You Need to Know about Fad Diets" Try to lose between 1-2 pounds per week by taking in fewer calories and burning off more calories You can succeed by limiting portions, limiting foods dense in calories and fat, becoming more active, and drinking 8 glasses of water a day (64 ounces) Don't skip meals, especially breakfast, as skipping meals may alter your metabolism Do not use over-the-counter weight loss pills or gimmicks that claim rapid weight loss A healthy BMI (or body mass index) is between 18.5 and 24.9 You can calculate your ideal BMI at the [place] website ClubMonetize.fr  12 Ways to Curb Anxiety  ?Anxiety is normal human sensation. It is what helped our ancestors survive the pitfalls of the wilderness. Anxiety is defined as experiencing worry or nervousness about an imminent event or something with an uncertain outcome. It is a feeling experienced by most people at some point in their lives. Anxiety can be triggered by a very personal issue, such as the illness of a loved one, or an event of global proportions, such as a refugee crisis. Some of the symptoms of anxiety are:  Feeling  restless.  Having a feeling of impending danger.  Increased heart rate.  Rapid breathing. Sweating.  Shaking.  Weakness or feeling tired.  Difficulty concentrating on anything except the current worry.  Insomnia.  Stomach or bowel problems. What can we do about anxiety we may be feeling? There are many techniques to help manage stress and relax. Here are 12 ways you can reduce your anxiety almost immediately: 1. Turn off the constant feed of information. Take a social media sabbatical. Studies have shown that social media directly contributes to social anxiety.  2. Monitor your television viewing habits. Are you watching shows that are also contributing to your anxiety, such as 24-hour news stations? Try watching something else, or better yet, nothing at all. Instead, listen to music, read an inspirational book or practice a hobby. 3. Eat nutritious meals. Also, don't skip meals and keep healthful snacks on hand. Hunger and poor diet contributes to feeling anxious. 4. Sleep. Sleeping on a regular schedule for at least seven to eight hours a night will do wonders for your outlook when you are awake. 5. Exercise. Regular exercise will help rid your body of that anxious energy and help you get more restful sleep. 6. Try deep (diaphragmatic) breathing. Inhale slowly through your nose for five seconds and exhale through your mouth. 7. Practice acceptance and gratitude. When anxiety hits, accept that there are things out of your control that shouldn't be of immediate concern.  8. Seek out humor. When anxiety strikes, watch a funny video, read jokes or call a friend who makes you laugh. Laughter is healing for our bodies and releases endorphins  that are calming. 9. Stay positive. Take the effort to replace negative thoughts with positive ones. Try to see a stressful situation in a positive light. Try to come up with solutions rather than dwelling on the problem. 10. Figure out what triggers your  anxiety. Keep a journal and make note of anxious moments and the events surrounding them. This will help you identify triggers you can avoid or even eliminate. 11. Talk to someone. Let a trusted friend, family member or even trained professional know that you are feeling overwhelmed and anxious. Verbalize what you are feeling and why.  12. Volunteer. If your anxiety is triggered by a crisis on a large scale, become an advocate and work to resolve the problem that is causing you unease. Anxiety is often unwelcome and can become overwhelming. If not kept in check, it can become a disorder that could require medical treatment. However, if you take the time to care for yourself and avoid the triggers that make you anxious, you will be able to find moments of relaxation and clarity that make your life much more enjoyable.    Steps to Quit Smoking  Smoking tobacco can be harmful to your health and can affect almost every organ in your body. Smoking puts you, and those around you, at risk for developing many serious chronic diseases. Quitting smoking is difficult, but it is one of the best things that you can do for your health. It is never too late to quit. WHAT ARE THE BENEFITS OF QUITTING SMOKING? When you quit smoking, you lower your risk of developing serious diseases and conditions, such as:  Lung cancer or lung disease, such as COPD.  Heart disease.  Stroke.  Heart attack.  Infertility.  Osteoporosis and bone fractures. Additionally, symptoms such as coughing, wheezing, and shortness of breath may get better when you quit. You may also find that you get sick less often because your body is stronger at fighting off colds and infections. If you are pregnant, quitting smoking can help to reduce your chances of having a baby of low birth weight. HOW DO I GET READY TO QUIT? When you decide to quit smoking, create a plan to make sure that you are successful. Before you quit:  Pick a date to  quit. Set a date within the next two weeks to give you time to prepare.  Write down the reasons why you are quitting. Keep this list in places where you will see it often, such as on your bathroom mirror or in your car or wallet.  Identify the people, places, things, and activities that make you want to smoke (triggers) and avoid them. Make sure to take these actions:  Throw away all cigarettes at home, at work, and in your car.  Throw away smoking accessories, such as Scientist, research (medical).  Clean your car and make sure to empty the ashtray.  Clean your home, including curtains and carpets.  Tell your family, friends, and coworkers that you are quitting. Support from your loved ones can make quitting easier.  Talk with your health care provider about your options for quitting smoking.  Find out what treatment options are covered by your health insurance. WHAT STRATEGIES CAN I USE TO QUIT SMOKING?  Talk with your healthcare provider about different strategies to quit smoking. Some strategies include:  Quitting smoking altogether instead of gradually lessening how much you smoke over a period of time. Research shows that quitting "cold [place]" is more successful than gradually  quitting.  Attending in-person counseling to help you build problem-solving skills. You are more likely to have success in quitting if you attend several counseling sessions. Even short sessions of 10 minutes can be effective.  Finding resources and support systems that can help you to quit smoking and remain smoke-free after you quit. These resources are most helpful when you use them often. They can include:  Online chats with a Social worker.  Telephone quitlines.  Printed Furniture conservator/restorer.  Support groups or group counseling.  Text messaging programs.  Mobile phone applications.  Taking medicines to help you quit smoking. (If you are pregnant or breastfeeding, talk with your health care provider  first.) Some medicines contain nicotine and some do not. Both types of medicines help with cravings, but the medicines that include nicotine help to relieve withdrawal symptoms. Your health care provider may recommend:  Nicotine patches, gum, or lozenges.  Nicotine inhalers or sprays.  Non-nicotine medicine that is taken by mouth. Talk with your health care provider about combining strategies, such as taking medicines while you are also receiving in-person counseling. Using these two strategies together makes you more likely to succeed in quitting than if you used either strategy on its own. If you are pregnant or breastfeeding, talk with your health care provider about finding counseling or other support strategies to quit smoking. Do not take medicine to help you quit smoking unless told to do so by your health care provider. WHAT THINGS CAN I DO TO MAKE IT EASIER TO QUIT? Quitting smoking might feel overwhelming at first, but there is a lot that you can do to make it easier. Take these important actions:  Reach out to your family and friends and ask that they support and encourage you during this time. Call telephone quitlines, reach out to support groups, or work with a counselor for support.  Ask people who smoke to avoid smoking around you.  Avoid places that trigger you to smoke, such as bars, parties, or smoke-break areas at work.  Spend time around people who do not smoke.  Lessen stress in your life, because stress can be a smoking trigger for some people. To lessen stress, try:  Exercising regularly.  Deep-breathing exercises.  Yoga.  Meditating.  Performing a body scan. This involves closing your eyes, scanning your body from head to toe, and noticing which parts of your body are particularly tense. Purposefully relax the muscles in those areas.  Download or purchase mobile phone or tablet apps (applications) that can help you stick to your quit plan by providing  reminders, tips, and encouragement. There are many free apps, such as QuitGuide from the [employer] Office manager for Disease Control and Prevention). You can find other support for quitting smoking (smoking cessation) through smokefree.gov and other websites. HOW WILL I FEEL WHEN I QUIT SMOKING? Within the first 24 hours of quitting smoking, you may start to feel some withdrawal symptoms. These symptoms are usually most noticeable 2-3 days after quitting, but they usually do not last beyond 2-3 weeks. Changes or symptoms that you might experience include:  Mood swings.  Restlessness, anxiety, or irritation.  Difficulty concentrating.  Dizziness.  Strong cravings for sugary foods in addition to nicotine.  Mild weight gain.  Constipation.  Nausea.  Coughing or a sore throat.  Changes in how your medicines work in your body.  A depressed mood.  Difficulty sleeping (insomnia). After the first 2-3 weeks of quitting, you may start to notice more positive results, such as:  Improved sense of smell and taste.  Decreased coughing and sore throat.  Slower heart rate.  Lower blood pressure.  Clearer skin.  The ability to breathe more easily.  Fewer sick days. Quitting smoking is very challenging for most people. Do not get discouraged if you are not successful the first time. Some people need to make many attempts to quit before they achieve long-term success. Do your best to stick to your quit plan, and talk with your health care provider if you have any questions or concerns.   This information is not intended to replace advice given to you by your health care provider. Make sure you discuss any questions you have with your health care provider.   Document Released: 12/16/2000 Document Revised: 05/08/2014 Document Reviewed: 05/08/2014 Elsevier Interactive Patient Education [employer].

## 2015-09-04 NOTE — Assessment & Plan Note (Signed)
Check vit D level. 

## 2015-09-04 NOTE — Assessment & Plan Note (Signed)
Check CBC; continue to f/u with hematologist; smoking cessation urged

## 2015-09-04 NOTE — Assessment & Plan Note (Signed)
Check TSH 

## 2015-09-04 NOTE — Assessment & Plan Note (Signed)
Controlled today; try DASH guidelines; work on weight loss

## 2015-09-04 NOTE — Assessment & Plan Note (Signed)
Check CMP.  ?

## 2015-09-04 NOTE — Progress Notes (Signed)
BP 122/82 (BP Location: Right Arm, Patient Position: Sitting, Cuff Size: Large)   Pulse 72   Temp 98.3 F (36.8 C)   Resp 18   Ht 5\' 10"  (1.778 m)   Wt 203 lb 3 oz (92.2 kg)   SpO2 93%   BMI 29.15 kg/m    Subjective:    Patient ID: Tyler Cunas., male    DOB: [birth year], 55 y.o.   MRN: [id number]  HPI: Tyler Trembly. is a 55 y.o. male  Chief Complaint  Patient presents with  . Medication Refill    Since last visit he had a upper respiratory infection, treated with zpak and cough med; not the flu though and feeling better  He would like a flu shot today  Depression and anxiety; "I am fine I reckon"; just another day he says; no SI/HI  HTN; on combination; tries to stay away from added salt; no leg swelling; no SHOB  High cholesterol; not watching diet too strictly, does try to eat salads and uses dressing, uses butter on baked potatoes; did lose some weight; HDL 32 in January; LDL 90  Breathing has been fairly good  Secondary erythrocytosis; sees hematologist; was told to quit smoking, but he says it's hard; last CBC was May, reviewed  Mild vitamin D deficiency; taking 5,000 twice a week; energy level not any better  Glucose was mildly elevated, not diabetic; dry mouth and blurred vision  Last thyroid was normal in Jan  Depression screen [facility] 2/9 09/04/2015 04/09/2015  Decreased Interest 1 0  Down, Depressed, Hopeless 3 1  PHQ - 2 Score 4 1  Altered sleeping 1 -  Change in appetite 0 -  Feeling bad or failure about yourself  1 -  Trouble concentrating 1 -  Moving slowly or fidgety/restless 1 -  Suicidal thoughts 0 -  PHQ-9 Score 8 -   Relevant past medical, surgical, family and social history reviewed Past Medical History:  Diagnosis Date  . AAT (alpha-1-antitrypsin) deficiency ([place])   . Anxiety   . Arthritis    SPINE  . Chronic low back pain   . COPD (chronic obstructive pulmonary disease) ([place])   . Depression   . Goiter   . Gout   .  Hyperlipidemia   . Hypertension   . Hypothyroidism   . Insomnia   . Panic attacks   . Thyroid cyst    x 2  . TMJ (temporomandibular joint disorder)   . Tobacco use   . Vitamin D deficiency disease    Past Surgical History:  Procedure Laterality Date  . COLONOSCOPY  2013   Dr. Orest Dikes 2   Family History  Problem Relation Age of Onset  . Heart disease Father   . Heart failure Father   . Heart attack Father     x 5  . Alcohol abuse Father   . COPD Father   . Heart attack Brother   . CAD Brother   . Hypertension Brother   . Alcohol abuse Paternal Grandfather   . Cancer Neg Hx   . Diabetes Neg Hx   . Stroke Neg Hx    Social History  Substance Use Topics  . Smoking status: Current Every Day Smoker    Packs/day: 1.00    Years: 40.00    Types: Cigarettes  . Smokeless tobacco: Never Used  . Alcohol use No   Interim medical history since last visit reviewed. Allergies and medications reviewed  Review of  Systems Per HPI unless specifically indicated above     Objective:    BP 122/82 (BP Location: Right Arm, Patient Position: Sitting, Cuff Size: Large)   Pulse 72   Temp 98.3 F (36.8 C)   Resp 18   Ht 5\' 10"  (1.778 m)   Wt 203 lb 3 oz (92.2 kg)   SpO2 93%   BMI 29.15 kg/m   Wt Readings from Last 3 Encounters:  09/04/15 203 lb 3 oz (92.2 kg)  05/13/15 206 lb 9.1 oz (93.7 kg)  04/22/15 207 lb (93.9 kg)    Physical Exam  Constitutional: He appears well-developed and well-nourished.  obese  Eyes: EOM are normal. No scleral icterus.  Neck: No JVD present.  Cardiovascular: Normal rate and regular rhythm.   Pulmonary/Chest: Effort normal and breath sounds normal. No respiratory distress.  Abdominal: Soft. Bowel sounds are normal. He exhibits no distension.  Musculoskeletal: Normal range of motion. He exhibits no edema.  Neurological: He is alert. He displays no tremor. Gait normal.  No tics  Skin: Skin is warm and dry. No pallor.  Psychiatric: His mood  appears not anxious. His affect is not blunt. His speech is not delayed. He is not slowed and not withdrawn. Cognition and memory are normal. He does not express impulsivity or inappropriate judgment. He does not exhibit a depressed mood.  Good eye contact with examiner; does not appear anxious today      Assessment & Plan:   Problem List Items Addressed This Visit      Cardiovascular and Mediastinum   Hypertension    Controlled today; try DASH guidelines; work on weight loss        Endocrine   Hypothyroidism    Check TSH      Relevant Orders   TSH (Completed)     Other   Vitamin D deficiency disease    Check vit D level      Relevant Orders   VITAMIN D 25 Hydroxy (Vit-D Deficiency, Fractures) (Completed)   Tobacco use    Encouraged smoking cessation; see AVS      Secondary erythrocytosis    Check CBC; continue to f/u with hematologist; smoking cessation urged      Relevant Orders   CBC with Differential/Platelet (Completed)   Panic attacks    Continue SSRI, refill sent      Relevant Medications   PARoxetine (PAXIL) 20 MG tablet   Needs flu shot    Flu shot offered and given today      Relevant Orders   Flu Vaccine QUAD 36+ mos PF IM (Fluarix & Fluzone Quad PF) (Completed)   Medication monitoring encounter    Check CMP      Relevant Orders   COMPLETE METABOLIC PANEL WITH GFR (Completed)   Hyperlipidemia    Try to lose another five pounds; limit saturated fats and get more whole grains; check lipids today      Relevant Orders   Lipid panel (Completed)   Hyperglycemia    Check glucose and A1c      Relevant Orders   Hemoglobin A1c (Completed)   Anxiety    Continue SSRI, refill sent      Relevant Medications   PARoxetine (PAXIL) 20 MG tablet    Other Visit Diagnoses   None.     Follow up plan: Return in about 4 months (around 01/04/2016) for visit with Dr. Sanda Klein.  An after-visit summary was printed and given to the patient at [place].   Please  see the patient instructions which may contain other information and recommendations beyond what is mentioned above in the assessment and plan.  Meds ordered this encounter  Medications  . PARoxetine (PAXIL) 20 MG tablet    Sig: Take 1 tablet (20 mg total) by mouth 2 (two) times daily.    Dispense:  60 tablet    Refill:  6    Orders Placed This Encounter  Procedures  . Flu Vaccine QUAD 36+ mos PF IM (Fluarix & Fluzone Quad PF)  . TSH  . COMPLETE METABOLIC PANEL WITH GFR  . CBC with Differential/Platelet  . Lipid panel  . VITAMIN D 25 Hydroxy (Vit-D Deficiency, Fractures)  . Hemoglobin A1c

## 2015-09-04 NOTE — Assessment & Plan Note (Signed)
Encouraged smoking cessation; see AVS 

## 2015-09-04 NOTE — Assessment & Plan Note (Signed)
Flu shot offered and given today 

## 2015-09-04 NOTE — Assessment & Plan Note (Signed)
Check glucose and A1c 

## 2015-09-04 NOTE — Assessment & Plan Note (Signed)
Try to lose another five pounds; limit saturated fats and get more whole grains; check lipids today

## 2015-09-05 LAB — VITAMIN D 25 HYDROXY (VIT D DEFICIENCY, FRACTURES): Vit D, 25-Hydroxy: 29 ng/mL — ABNORMAL LOW (ref 30–100)

## 2015-09-07 ENCOUNTER — Encounter: Payer: Self-pay | Admitting: Family Medicine

## 2015-09-11 ENCOUNTER — Other Ambulatory Visit: Payer: Self-pay | Admitting: Family Medicine

## 2015-09-11 DIAGNOSIS — E785 Hyperlipidemia, unspecified: Secondary | ICD-10-CM

## 2015-09-11 DIAGNOSIS — Z5181 Encounter for therapeutic drug level monitoring: Secondary | ICD-10-CM

## 2015-09-11 MED ORDER — SIMVASTATIN 40 MG PO TABS: 40.0000 mg | ORAL_TABLET | Freq: Every day | ORAL | 1 refills | Status: DC

## 2015-09-11 NOTE — Assessment & Plan Note (Signed)
Check lipids on higher dose of statin in 6 weeks

## 2015-09-11 NOTE — Assessment & Plan Note (Signed)
Check sgpt in 6 weeks 

## 2015-09-11 NOTE — Progress Notes (Signed)
Orders for future lipids and sgpt in 6 weeks, check on higher dose of statin

## 2015-09-18 ENCOUNTER — Inpatient Hospital Stay: Payer: Medicaid Other | Attending: Internal Medicine | Admitting: Internal Medicine

## 2015-09-18 DIAGNOSIS — J449 Chronic obstructive pulmonary disease, unspecified: Secondary | ICD-10-CM | POA: Insufficient documentation

## 2015-09-18 DIAGNOSIS — D751 Secondary polycythemia: Secondary | ICD-10-CM | POA: Insufficient documentation

## 2015-09-18 DIAGNOSIS — F17210 Nicotine dependence, cigarettes, uncomplicated: Secondary | ICD-10-CM | POA: Diagnosis not present

## 2015-09-18 DIAGNOSIS — I10 Essential (primary) hypertension: Secondary | ICD-10-CM | POA: Diagnosis not present

## 2015-09-18 DIAGNOSIS — R51 Headache: Secondary | ICD-10-CM | POA: Insufficient documentation

## 2015-09-18 DIAGNOSIS — E785 Hyperlipidemia, unspecified: Secondary | ICD-10-CM | POA: Diagnosis not present

## 2015-09-18 DIAGNOSIS — F419 Anxiety disorder, unspecified: Secondary | ICD-10-CM | POA: Insufficient documentation

## 2015-09-18 DIAGNOSIS — R5382 Chronic fatigue, unspecified: Secondary | ICD-10-CM

## 2015-09-18 DIAGNOSIS — E039 Hypothyroidism, unspecified: Secondary | ICD-10-CM | POA: Diagnosis not present

## 2015-09-18 DIAGNOSIS — Z79899 Other long term (current) drug therapy: Secondary | ICD-10-CM | POA: Insufficient documentation

## 2015-09-18 DIAGNOSIS — E559 Vitamin D deficiency, unspecified: Secondary | ICD-10-CM | POA: Insufficient documentation

## 2015-09-18 DIAGNOSIS — M1990 Unspecified osteoarthritis, unspecified site: Secondary | ICD-10-CM | POA: Insufficient documentation

## 2015-09-18 NOTE — Assessment & Plan Note (Addendum)
#   ERYTHROCYTOSIS- Jak-2 NEG; Likely secondary. Feels better post phlebotomy. Interested in continued phlebotomy.   # However proceed with phlebotomy today as hematocrit is 50.5 today.  # Smoking  Counseled;   the patient regarding smoking.   # H&H every 2 months; phlebotomy as needed.   # follow up in 6 months.

## 2015-09-18 NOTE — Progress Notes (Signed)
[place] OFFICE PROGRESS NOTE  Patient Care Team: Arnetha Courser, MD as PCP - General (Family Medicine) Arnetha Courser, MD as Attending Physician (Family Medicine) Robert Bellow, MD (General Surgery)   SUMMARY OF HEMATOLOGIC/ONCOLOGIC HISTORY:  # 2016 ERYTHROCYTOSIS- JAK-2 NEG; Nov 2015] [HCT at presentation- 55] Phlebotomy if HCT >48 [Bcr-abl-NEG/flowcytometry-Neg]   # SMOKING-     INTERVAL HISTORY:  A very pleasant 55 year old male patient with above history of smoking and history of erythrocytosis currently on intermittent phlebotomies for hematocrit greater than 48 is here for follow-up. Patient was recently evaluated by PCP lab hematocrit greater than 50 need a phlebotomy.  Patient has chronic fatigue chronic intermittent headaches. Patient states his headaches fatigue slightly improves post phlebotomy.  Otherwise he denies any unusual shortness of breath or cough or chest pain. Denies any swelling in the legs or joint pains.   REVIEW OF SYSTEMS:  A complete 10 point review of system is done which is negative except mentioned above/history of present illness.   PAST MEDICAL HISTORY :  Past Medical History:  Diagnosis Date  . AAT (alpha-1-antitrypsin) deficiency ([place])   . Anxiety   . Arthritis    SPINE  . Chronic low back pain   . COPD (chronic obstructive pulmonary disease) ([place])   . Depression   . Goiter   . Gout   . Hyperlipidemia   . Hypertension   . Hypothyroidism   . Insomnia   . Panic attacks   . Thyroid cyst    x 2  . TMJ (temporomandibular joint disorder)   . Tobacco use   . Vitamin D deficiency disease     PAST SURGICAL HISTORY :   Past Surgical History:  Procedure Laterality Date  . COLONOSCOPY  2013   Dr. Orest Dikes 2    FAMILY HISTORY :   Family History  Problem Relation Age of Onset  . Heart disease Father   . Heart failure Father   . Heart attack Father     x 5  . Alcohol abuse Father   . COPD Father   . Heart  attack Brother   . CAD Brother   . Hypertension Brother   . Alcohol abuse Paternal Grandfather   . Cancer Neg Hx   . Diabetes Neg Hx   . Stroke Neg Hx     SOCIAL HISTORY:   Social History  Substance Use Topics  . Smoking status: Current Every Day Smoker    Packs/day: 1.00    Years: 40.00    Types: Cigarettes  . Smokeless tobacco: Never Used  . Alcohol use No    ALLERGIES:  is allergic to no known allergies.  MEDICATIONS:  Current Outpatient Prescriptions  Medication Sig Dispense Refill  . Cholecalciferol (VITAMIN D) 2000 units CAPS Take 1 capsule by mouth daily.    Marland Kitchen ibuprofen (ADVIL,MOTRIN) 200 MG tablet Take 200 mg by mouth 2 (two) times daily.    Marland Kitchen levothyroxine (SYNTHROID, LEVOTHROID) 100 MCG tablet Take 1 tablet (100 mcg total) by mouth daily. 30 tablet 5  . lisinopril-hydrochlorothiazide (PRINZIDE,ZESTORETIC) 20-12.5 MG tablet TAKE 1 AND 1/2 TABLET BY MOUTH EVERY DAY 45 tablet 0  . PARoxetine (PAXIL) 20 MG tablet Take 1 tablet (20 mg total) by mouth 2 (two) times daily. 60 tablet 6  . simvastatin (ZOCOR) 40 MG tablet Take 1 tablet (40 mg total) by mouth at bedtime. 30 tablet 1   No current facility-administered medications for this visit.  PHYSICAL EXAMINATION:   BP 133/80 (BP Location: Right Arm, Patient Position: Sitting)   Pulse 82   Temp 98.3 F (36.8 C) (Tympanic)   Resp 18   Ht 5\' 10"  (1.778 m)   Wt 202 lb (91.6 kg)   BMI 28.98 kg/m   Filed Weights   09/18/15 1359  Weight: 202 lb (91.6 kg)    GENERAL: Well-nourished well-developed; Alert, no distress and comfortable. Alone. EYES: no pallor or icterus OROPHARYNX: no thrush or ulceration.  NECK: supple, no masses felt LYMPH:  no palpable lymphadenopathy in the cervical, axillary or inguinal regions LUNGS: clear to auscultation and  No wheeze or crackles HEART/CVS: regular rate & rhythm and no murmurs; No lower extremity edema ABDOMEN:abdomen soft, non-tender and normal bowel  sounds Musculoskeletal:no cyanosis of digits and no clubbing  PSYCH: alert & oriented x 3 with fluent speech NEURO: no focal motor/sensory deficits SKIN:  no rashes or significant lesions  LABORATORY DATA:  I have reviewed the data as listed    Component Value Date/Time   NA 137 09/04/2015 1046   NA 141 01/08/2015 1115   K 4.3 09/04/2015 1046   CL 98 09/04/2015 1046   CO2 25 09/04/2015 1046   GLUCOSE 95 09/04/2015 1046   BUN 12 09/04/2015 1046   BUN 13 01/08/2015 1115   CREATININE 1.10 09/04/2015 1046   CALCIUM 9.9 09/04/2015 1046   PROT 8.0 09/04/2015 1046   PROT 7.4 01/08/2015 1115   ALBUMIN 4.3 09/04/2015 1046   ALBUMIN 4.4 01/08/2015 1115   AST 21 09/04/2015 1046   ALT 20 09/04/2015 1046   ALKPHOS 97 09/04/2015 1046   BILITOT 0.4 09/04/2015 1046   BILITOT 0.3 01/08/2015 1115   GFRNONAA 74 09/04/2015 1046   GFRAA 86 09/04/2015 1046    No results found for: SPEP, UPEP  Lab Results  Component Value Date   WBC 9.8 09/04/2015   NEUTROABS 6,272 09/04/2015   HGB 18.5 (H) 09/04/2015   HCT 53.5 (H) 09/04/2015   MCV 85.3 09/04/2015   PLT 367 09/04/2015      Chemistry      Component Value Date/Time   NA 137 09/04/2015 1046   NA 141 01/08/2015 1115   K 4.3 09/04/2015 1046   CL 98 09/04/2015 1046   CO2 25 09/04/2015 1046   BUN 12 09/04/2015 1046   BUN 13 01/08/2015 1115   CREATININE 1.10 09/04/2015 1046      Component Value Date/Time   CALCIUM 9.9 09/04/2015 1046   ALKPHOS 97 09/04/2015 1046   AST 21 09/04/2015 1046   ALT 20 09/04/2015 1046   BILITOT 0.4 09/04/2015 1046   BILITOT 0.3 01/08/2015 1115        ASSESSMENT & PLAN:  Secondary erythrocytosis # ERYTHROCYTOSIS- Jak-2 NEG; Likely secondary. Feels better post phlebotomy. Interested in continued phlebotomy.   # However proceed with phlebotomy today as hematocrit is 50.5 today.  # Smoking  Counseled;   the patient regarding smoking.   # H&H every 2 months; phlebotomy as needed.   # follow up in  6 months.      Cammie Sickle, MD 09/18/2015 3:45 PM

## 2015-09-18 NOTE — Progress Notes (Signed)
Pt returns to clinic today. Ref by sooner by pcp. 53.5/ 18.5.  Patient is smoking 1/2 pack of cigarettes a day. Pt was previously smoking 1.5 pks a day.  Pt started to cut back on cigarette smoking 2 months ago.  Pt c/o headaches. He takes advil prn for headaches.  pt c/o fatigue.

## 2015-09-20 ENCOUNTER — Inpatient Hospital Stay: Payer: Medicaid Other

## 2015-10-07 ENCOUNTER — Other Ambulatory Visit: Payer: Self-pay | Admitting: Family Medicine

## 2015-10-07 NOTE — Telephone Encounter (Signed)
CMP from August 2017 reviewed; Rx approved

## 2015-11-11 ENCOUNTER — Other Ambulatory Visit: Payer: Medicaid Other

## 2015-11-11 ENCOUNTER — Ambulatory Visit: Payer: Medicaid Other | Admitting: Internal Medicine

## 2015-11-18 ENCOUNTER — Inpatient Hospital Stay: Payer: Medicaid Other

## 2015-11-18 ENCOUNTER — Inpatient Hospital Stay: Payer: Medicaid Other | Attending: Internal Medicine

## 2015-11-18 VITALS — BP 123/82

## 2015-11-18 DIAGNOSIS — F17210 Nicotine dependence, cigarettes, uncomplicated: Secondary | ICD-10-CM | POA: Diagnosis not present

## 2015-11-18 DIAGNOSIS — D751 Secondary polycythemia: Secondary | ICD-10-CM

## 2015-11-18 LAB — HEMATOCRIT: HEMATOCRIT: 53.1 % — AB (ref 40.0–52.0)

## 2015-11-18 LAB — HEMOGLOBIN: Hemoglobin: 17.9 g/dL (ref 13.0–18.0)

## 2015-11-21 ENCOUNTER — Other Ambulatory Visit: Payer: Self-pay | Admitting: Family Medicine

## 2015-11-22 NOTE — Telephone Encounter (Signed)
Pt.notified

## 2015-11-22 NOTE — Telephone Encounter (Signed)
Please ask patient to get his labs for me (ALT and lipid panel ordered 09/11/15) We'd like those done to make sure the dose is right and we're not causing any harm to his liver I'll send just a few pills, but really want to see these before further refills are approved Thank you

## 2015-12-16 ENCOUNTER — Inpatient Hospital Stay: Payer: Medicaid Other

## 2016-01-03 ENCOUNTER — Encounter: Payer: Self-pay | Admitting: Family Medicine

## 2016-01-03 ENCOUNTER — Ambulatory Visit (INDEPENDENT_AMBULATORY_CARE_PROVIDER_SITE_OTHER): Payer: Medicaid Other | Admitting: Family Medicine

## 2016-01-03 VITALS — BP 120/82 | HR 76 | Temp 97.8°F | Resp 16 | Ht 70.0 in | Wt 209.2 lb

## 2016-01-03 DIAGNOSIS — E038 Other specified hypothyroidism: Secondary | ICD-10-CM | POA: Diagnosis not present

## 2016-01-03 DIAGNOSIS — B009 Herpesviral infection, unspecified: Secondary | ICD-10-CM

## 2016-01-03 DIAGNOSIS — I10 Essential (primary) hypertension: Secondary | ICD-10-CM

## 2016-01-03 DIAGNOSIS — D751 Secondary polycythemia: Secondary | ICD-10-CM | POA: Diagnosis not present

## 2016-01-03 DIAGNOSIS — E782 Mixed hyperlipidemia: Secondary | ICD-10-CM

## 2016-01-03 DIAGNOSIS — F419 Anxiety disorder, unspecified: Secondary | ICD-10-CM

## 2016-01-03 DIAGNOSIS — Z720 Tobacco use: Secondary | ICD-10-CM | POA: Diagnosis not present

## 2016-01-03 DIAGNOSIS — E559 Vitamin D deficiency, unspecified: Secondary | ICD-10-CM | POA: Diagnosis not present

## 2016-01-03 DIAGNOSIS — Z23 Encounter for immunization: Secondary | ICD-10-CM | POA: Diagnosis not present

## 2016-01-03 DIAGNOSIS — R739 Hyperglycemia, unspecified: Secondary | ICD-10-CM

## 2016-01-03 DIAGNOSIS — Z5181 Encounter for therapeutic drug level monitoring: Secondary | ICD-10-CM

## 2016-01-03 LAB — ALT: ALT: 18 U/L (ref 9–46)

## 2016-01-03 LAB — LIPID PANEL
Cholesterol: 255 mg/dL — ABNORMAL HIGH (ref <200)
HDL: 28 mg/dL — AB (ref >40)
LDL CALC: 194 mg/dL — AB (ref <100)
Total CHOL/HDL Ratio: 9.1 Ratio — ABNORMAL HIGH (ref <5.0)
Triglycerides: 167 mg/dL — ABNORMAL HIGH (ref <150)
VLDL: 33 mg/dL — AB (ref <30)

## 2016-01-03 MED ORDER — SIMVASTATIN 40 MG PO TABS: 40.0000 mg | ORAL_TABLET | Freq: Every day | ORAL | 5 refills | Status: DC

## 2016-01-03 MED ORDER — VALACYCLOVIR HCL 500 MG PO TABS: 500.0000 mg | ORAL_TABLET | Freq: Every day | ORAL | 11 refills | Status: DC

## 2016-01-03 NOTE — Assessment & Plan Note (Signed)
Urged patient to stop smoking; follow-up with Dr. Jacinto Reap

## 2016-01-03 NOTE — Assessment & Plan Note (Signed)
Check sgpt 

## 2016-01-03 NOTE — Assessment & Plan Note (Signed)
Last TSH reviewed 

## 2016-01-03 NOTE — Assessment & Plan Note (Signed)
Explained that appearance and recurrence and symptoms are most suggestive of herpes infection, not psoriatic arthritis; will start valacyclovir; supportive listening and validation of his reaction to infection, explained this is a very common condition; can be spread; will use medicine to reduce flares

## 2016-01-03 NOTE — Assessment & Plan Note (Signed)
Encouraged him to take 1000 iu daily vit D3

## 2016-01-03 NOTE — Assessment & Plan Note (Signed)
Check A1c and glucose 

## 2016-01-03 NOTE — Progress Notes (Signed)
BP 120/82 (BP Location: Left Arm, Patient Position: Sitting, Cuff Size: Normal)   Pulse 76   Temp 97.8 F (36.6 C) (Oral)   Resp 16   Ht 5\' 10"  (1.778 m)   Wt 209 lb 3 oz (94.9 kg)   SpO2 98%   BMI 30.02 kg/m    Subjective:    Patient ID: Tyler Cobb., male    DOB: 02-Mar-1960, 55 y.o.   MRN: [id number]  HPI: Tyler Tibbits. is a 55 y.o. male  Chief Complaint  Patient presents with  . Follow-up    4 months    He has elevated H/H; he is seeing a new doctor at the cancer center; he undergoes phlebotomy occasionally HTN; blood pressure is well-controlled He still smokes, but is worried about side effects from Chantix; tried patches 25 years ago; quit for 3 days; daughter and her husband smoke, but not seeing them often He thinks he has psoriatic arthritis, back breaks out and turns to scabs; burns, feels like back is on fire; feels like his back is a toothache; ten years duration; had an xray done; couldn't get out of the bed at one point; hasn't tried anything; can't wash dishes at times; fingers hurt High cholesterol; eats bacon and eggs sometimes; some whole grains; has not been taking his cholesterol medicine Hypothyroidism; last TSH was 1.91 in August; gained a little weight over holidays Vitamin D deficiency; not taking any supplement Prediabetes; last A1c excellent at 5.7 He struggles with anxiety; made it in to the appointment today; wishes to continue medicine  Depression screen [facility] 2/9 01/03/2016 09/04/2015 04/09/2015  Decreased Interest 0 1 0  Down, Depressed, Hopeless 0 3 1  PHQ - 2 Score 0 4 1  Altered sleeping - 1 -  Change in appetite - 0 -  Feeling bad or failure about yourself  - 1 -  Trouble concentrating - 1 -  Moving slowly or fidgety/restless - 1 -  Suicidal thoughts - 0 -  PHQ-9 Score - 8 -   Relevant past medical, surgical, family and social history reviewed Past Medical History:  Diagnosis Date  . AAT (alpha-1-antitrypsin) deficiency ([place])    . Anxiety   . Arthritis    SPINE  . Chronic low back pain   . COPD (chronic obstructive pulmonary disease) ([place])   . Depression   . Goiter   . Gout   . Hyperlipidemia   . Hypertension   . Hypothyroidism   . Insomnia   . Panic attacks   . Thyroid cyst    x 2  . TMJ (temporomandibular joint disorder)   . Tobacco use   . Vitamin D deficiency disease    Past Surgical History:  Procedure Laterality Date  . COLONOSCOPY  2013   Dr. Orest Dikes 2   Family History  Problem Relation Age of Onset  . Heart disease Father   . Heart failure Father   . Heart attack Father     x 5  . Alcohol abuse Father   . COPD Father   . Heart attack Brother   . CAD Brother   . Hypertension Brother   . Alcohol abuse Paternal Grandfather   . Cancer Neg Hx   . Diabetes Neg Hx   . Stroke Neg Hx    Social History  Substance Use Topics  . Smoking status: Current Every Day Smoker    Packs/day: 1.00    Years: 40.00    Types: Cigarettes  .  Smokeless tobacco: Never Used  . Alcohol use No   Interim medical history since last visit reviewed. Allergies and medications reviewed  Review of Systems Per HPI unless specifically indicated above     Objective:    BP 120/82 (BP Location: Left Arm, Patient Position: Sitting, Cuff Size: Normal)   Pulse 76   Temp 97.8 F (36.6 C) (Oral)   Resp 16   Ht 5\' 10"  (1.778 m)   Wt 209 lb 3 oz (94.9 kg)   SpO2 98%   BMI 30.02 kg/m   Wt Readings from Last 3 Encounters:  01/03/16 209 lb 3 oz (94.9 kg)  09/18/15 202 lb (91.6 kg)  09/04/15 203 lb 3 oz (92.2 kg)    Physical Exam  Constitutional: He appears well-developed and well-nourished.  obese  Eyes: EOM are normal. No scleral icterus.  Neck: No JVD present.  Cardiovascular: Normal rate and regular rhythm.   Pulmonary/Chest: Effort normal and breath sounds normal. No respiratory distress.  Abdominal: Soft. Bowel sounds are normal. He exhibits no distension.  Musculoskeletal: Normal range of  motion. He exhibits no edema.  Neurological: He is alert. He displays no tremor. Gait normal.  No tics  Skin: Skin is warm and dry. No pallor.  Erythematous area on the superior left side of gluteal cleft, cluster of vesicles; healing, scarred, scabbed area on right medial gluteal cleft suggestive of similar type outbreak perhaps several weeks ago  Psychiatric: His mood appears anxious. His affect is not blunt. His speech is not delayed. He is not slowed and not withdrawn. Cognition and memory are normal. He does not express impulsivity or inappropriate judgment. He does not exhibit a depressed mood.  Good eye contact with examiner   Results for orders placed or performed in visit on 11/18/15  Hematocrit [facility])  Result Value Ref Range   HCT 53.1 (H) 40.0 - 52.0 %  Hemoglobin ([facility])  Result Value Ref Range   Hemoglobin 17.9 13.0 - 18.0 g/dL      Assessment & Plan:   Problem List Items Addressed This Visit      Cardiovascular and Mediastinum   Hypertension - Primary (Chronic)    Well-controlled today      Relevant Medications   simvastatin (ZOCOR) 40 MG tablet     Endocrine   Hypothyroidism (Chronic)    Last TSH reviewed        Other   Vitamin D deficiency disease    Encouraged him to take 1000 iu daily vit D3      Tobacco use    Encouraged him to quit; consider Jan 1st as a quit date; see AVS      Secondary erythrocytosis    Urged patient to stop smoking; follow-up with Dr. B      Medication monitoring encounter    Check sgpt      Relevant Orders   ALT   Hyperlipidemia (Chronic)    Check lipids; try to eat healthier, limit saturated fats      Relevant Medications   simvastatin (ZOCOR) 40 MG tablet   Other Relevant Orders   Lipid panel   Hyperglycemia    Check A1c and glucose      Herpes    Explained that appearance and recurrence and symptoms are most suggestive of herpes infection, not psoriatic arthritis; will start valacyclovir; supportive  listening and validation of his reaction to infection, explained this is a very common condition; can be spread; will use medicine to reduce flares  Relevant Medications   valACYclovir (VALTREX) 500 MG tablet   Anxiety    Stable, continue medicine       Other Visit Diagnoses    Need for diphtheria-tetanus-pertussis (Tdap) vaccine       Relevant Orders   Tdap vaccine greater than or equal to 7yo IM (Completed)      Follow up plan: Return in about 3 months (around 04/02/2016) for fasting labs and visit.  An after-visit summary was printed and given to the patient at [place].  Please see the patient instructions which may contain other information and recommendations beyond what is mentioned above in the assessment and plan.  Meds ordered this encounter  Medications  . valACYclovir (VALTREX) 500 MG tablet    Sig: Take 1 tablet (500 mg total) by mouth daily.    Dispense:  30 tablet    Refill:  11  . simvastatin (ZOCOR) 40 MG tablet    Sig: Take 1 tablet (40 mg total) by mouth at bedtime.    Dispense:  30 tablet    Refill:  5    Orders Placed This Encounter  Procedures  . Tdap vaccine greater than or equal to 7yo IM  . ALT  . Lipid panel

## 2016-01-03 NOTE — Assessment & Plan Note (Signed)
Well controlled today.

## 2016-01-03 NOTE — Patient Instructions (Addendum)
I do encourage you to quit smoking Call [phone] to sign up for smoking cessation classes You can call 1-800-QUIT-NOW to talk with a smoking cessation coach Do try to remember to take 1,000 iu of vitamin D3 daily Limit eggs to no more than 3 per week if possible Try to limit saturated fats in your diet (bologna, hot dogs, barbeque, cheeseburgers, hamburgers, steak, bacon, sausage, cheese, etc.) and get more fresh fruits, vegetables, and whole grains Start the new medicine and let me know if any problems  Steps to Quit Smoking Smoking tobacco can be bad for your health. It can also affect almost every organ in your body. Smoking puts you and people around you at risk for many serious long-lasting (chronic) diseases. Quitting smoking is hard, but it is one of the best things that you can do for your health. It is never too late to quit. What are the benefits of quitting smoking? When you quit smoking, you lower your risk for getting serious diseases and conditions. They can include:  Lung cancer or lung disease.  Heart disease.  Stroke.  Heart attack.  Not being able to have children (infertility).  Weak bones (osteoporosis) and broken bones (fractures). If you have coughing, wheezing, and shortness of breath, those symptoms may get better when you quit. You may also get sick less often. If you are pregnant, quitting smoking can help to lower your chances of having a baby of low birth weight. What can I do to help me quit smoking? Talk with your doctor about what can help you quit smoking. Some things you can do (strategies) include:  Quitting smoking totally, instead of slowly cutting back how much you smoke over a period of time.  Going to in-person counseling. You are more likely to quit if you go to many counseling sessions.  Using resources and support systems, such as:  Online chats with a Social worker.  Phone quitlines.  Printed Furniture conservator/restorer.  Support groups or  group counseling.  Text messaging programs.  Mobile phone apps or applications.  Taking medicines. Some of these medicines may have nicotine in them. If you are pregnant or breastfeeding, do not take any medicines to quit smoking unless your doctor says it is okay. Talk with your doctor about counseling or other things that can help you. Talk with your doctor about using more than one strategy at the same time, such as taking medicines while you are also going to in-person counseling. This can help make quitting easier. What things can I do to make it easier to quit? Quitting smoking might feel very hard at first, but there is a lot that you can do to make it easier. Take these steps:  Talk to your family and friends. Ask them to support and encourage you.  Call phone quitlines, reach out to support groups, or work with a Social worker.  Ask people who smoke to not smoke around you.  Avoid places that make you want (trigger) to smoke, such as:  Bars.  Parties.  Smoke-break areas at work.  Spend time with people who do not smoke.  Lower the stress in your life. Stress can make you want to smoke. Try these things to help your stress:  Getting regular exercise.  Deep-breathing exercises.  Yoga.  Meditating.  Doing a body scan. To do this, close your eyes, focus on one area of your body at a time from head to toe, and notice which parts of your body are tense. Try  to relax the muscles in those areas.  Download or buy apps on your mobile phone or tablet that can help you stick to your quit plan. There are many free apps, such as QuitGuide from the [employer] Office manager for Disease Control and Prevention). You can find more support from smokefree.gov and other websites. This information is not intended to replace advice given to you by your health care provider. Make sure you discuss any questions you have with your health care provider. Document Released: 10/18/2008 Document Revised:  08/20/2015 Document Reviewed: 05/08/2014 Elsevier Interactive Patient Education  2017 [employer].

## 2016-01-03 NOTE — Assessment & Plan Note (Signed)
Encouraged him to quit; consider Jan 1st as a quit date; see AVS

## 2016-01-03 NOTE — Assessment & Plan Note (Signed)
Stable, continue medicine 

## 2016-01-03 NOTE — Assessment & Plan Note (Signed)
Check lipids; try to eat healthier, limit saturated fats

## 2016-01-04 ENCOUNTER — Other Ambulatory Visit: Payer: Self-pay | Admitting: Family Medicine

## 2016-01-04 DIAGNOSIS — Z5181 Encounter for therapeutic drug level monitoring: Secondary | ICD-10-CM

## 2016-01-04 DIAGNOSIS — E782 Mixed hyperlipidemia: Secondary | ICD-10-CM

## 2016-01-04 NOTE — Assessment & Plan Note (Signed)
Check sgpt in 6 weeks 

## 2016-01-04 NOTE — Assessment & Plan Note (Signed)
Check lipids in 6 weeks back on statin

## 2016-01-04 NOTE — Progress Notes (Signed)
Back on statin; labs in 6 weeks

## 2016-01-20 ENCOUNTER — Inpatient Hospital Stay: Payer: Medicaid Other

## 2016-01-20 ENCOUNTER — Inpatient Hospital Stay: Payer: Medicaid Other | Attending: Internal Medicine

## 2016-01-20 VITALS — BP 151/85 | HR 72 | Resp 20

## 2016-01-20 DIAGNOSIS — D751 Secondary polycythemia: Secondary | ICD-10-CM

## 2016-01-20 DIAGNOSIS — F17210 Nicotine dependence, cigarettes, uncomplicated: Secondary | ICD-10-CM | POA: Diagnosis not present

## 2016-01-20 LAB — HEMATOCRIT: HCT: 52.1 % — ABNORMAL HIGH (ref 40.0–52.0)

## 2016-01-20 LAB — HEMOGLOBIN: Hemoglobin: 17.5 g/dL (ref 13.0–18.0)

## 2016-02-05 ENCOUNTER — Other Ambulatory Visit: Payer: Self-pay | Admitting: Family Medicine

## 2016-02-06 ENCOUNTER — Other Ambulatory Visit: Payer: Self-pay | Admitting: Family Medicine

## 2016-02-06 NOTE — Telephone Encounter (Signed)
Already addressed levothyroxine (SYNTHROID, LEVOTHROID) 100 MCG tablet 30 tablet 5 02/06/2016    Sig - Route: TAKE 1 TABLET (100 MCG TOTAL) BY MOUTH DAILY. - Oral   E-Prescribing Status: Receipt confirmed by pharmacy (02/06/2016 8:00 AM EST)

## 2016-02-06 NOTE — Telephone Encounter (Signed)
Normal TSH Aug 2017; Rx approved

## 2016-03-18 ENCOUNTER — Inpatient Hospital Stay: Payer: Medicaid Other | Attending: Internal Medicine

## 2016-03-18 ENCOUNTER — Inpatient Hospital Stay (HOSPITAL_BASED_OUTPATIENT_CLINIC_OR_DEPARTMENT_OTHER): Payer: Medicaid Other | Admitting: Internal Medicine

## 2016-03-18 ENCOUNTER — Inpatient Hospital Stay: Payer: Medicaid Other

## 2016-03-18 VITALS — BP 137/74 | HR 67 | Temp 96.4°F | Wt 210.0 lb

## 2016-03-18 DIAGNOSIS — R5382 Chronic fatigue, unspecified: Secondary | ICD-10-CM | POA: Diagnosis not present

## 2016-03-18 DIAGNOSIS — E039 Hypothyroidism, unspecified: Secondary | ICD-10-CM | POA: Diagnosis not present

## 2016-03-18 DIAGNOSIS — E559 Vitamin D deficiency, unspecified: Secondary | ICD-10-CM | POA: Diagnosis not present

## 2016-03-18 DIAGNOSIS — F418 Other specified anxiety disorders: Secondary | ICD-10-CM

## 2016-03-18 DIAGNOSIS — J449 Chronic obstructive pulmonary disease, unspecified: Secondary | ICD-10-CM | POA: Diagnosis not present

## 2016-03-18 DIAGNOSIS — M545 Low back pain: Secondary | ICD-10-CM | POA: Insufficient documentation

## 2016-03-18 DIAGNOSIS — I10 Essential (primary) hypertension: Secondary | ICD-10-CM | POA: Insufficient documentation

## 2016-03-18 DIAGNOSIS — Z79899 Other long term (current) drug therapy: Secondary | ICD-10-CM

## 2016-03-18 DIAGNOSIS — G8929 Other chronic pain: Secondary | ICD-10-CM | POA: Diagnosis not present

## 2016-03-18 DIAGNOSIS — M109 Gout, unspecified: Secondary | ICD-10-CM | POA: Diagnosis not present

## 2016-03-18 DIAGNOSIS — R51 Headache: Secondary | ICD-10-CM

## 2016-03-18 DIAGNOSIS — D751 Secondary polycythemia: Secondary | ICD-10-CM

## 2016-03-18 DIAGNOSIS — E785 Hyperlipidemia, unspecified: Secondary | ICD-10-CM

## 2016-03-18 DIAGNOSIS — G4700 Insomnia, unspecified: Secondary | ICD-10-CM | POA: Insufficient documentation

## 2016-03-18 DIAGNOSIS — F17210 Nicotine dependence, cigarettes, uncomplicated: Secondary | ICD-10-CM | POA: Diagnosis not present

## 2016-03-18 LAB — HEMOGLOBIN: Hemoglobin: 17.1 g/dL (ref 13.0–18.0)

## 2016-03-18 LAB — HEMATOCRIT: HCT: 49.9 % (ref 40.0–52.0)

## 2016-03-18 NOTE — Progress Notes (Signed)
Patient here today for follow up.  Patient states no new concerns today  

## 2016-03-18 NOTE — Progress Notes (Signed)
[place] OFFICE PROGRESS NOTE  Patient Care Team: Arnetha Courser, MD as PCP - General (Family Medicine) Arnetha Courser, MD as Attending Physician (Family Medicine) Robert Bellow, MD (General Surgery)   SUMMARY OF HEMATOLOGIC/ONCOLOGIC HISTORY:  # 2016 ERYTHROCYTOSIS- JAK-2 NEG; Nov 2015] [HCT at presentation- 55] Phlebotomy if HCT >48 [Bcr-abl-NEG/flowcytometry-Neg]   # SMOKING-     INTERVAL HISTORY:  A very pleasant 56 year old male patient with above history of smoking and history of erythrocytosis currently on intermittent phlebotomies for hematocrit greater than 48 is here for follow-up.   Patient has chronic fatigue chronic intermittent headaches. Patient states his headaches fatigue slightly improves post phlebotomy. Patient unfortunately continues to smoke. He does not want to quit smoking.  Otherwise he denies any unusual shortness of breath or cough or chest pain. Denies any swelling in the legs or joint pains. Denies any blood clots.   REVIEW OF SYSTEMS:  A complete 10 point review of system is done which is negative except mentioned above/history of present illness.   PAST MEDICAL HISTORY :  Past Medical History:  Diagnosis Date  . AAT (alpha-1-antitrypsin) deficiency ([place])   . Anxiety   . Arthritis    SPINE  . Chronic low back pain   . COPD (chronic obstructive pulmonary disease) ([place])   . Depression   . Goiter   . Gout   . Hyperlipidemia   . Hypertension   . Hypothyroidism   . Insomnia   . Panic attacks   . Thyroid cyst    x 2  . TMJ (temporomandibular joint disorder)   . Tobacco use   . Vitamin D deficiency disease     PAST SURGICAL HISTORY :   Past Surgical History:  Procedure Laterality Date  . COLONOSCOPY  2013   Dr. Orest Dikes 2    FAMILY HISTORY :   Family History  Problem Relation Age of Onset  . Heart disease Father   . Heart failure Father   . Heart attack Father     x 5  . Alcohol abuse Father   . COPD Father    . Heart attack Brother   . CAD Brother   . Hypertension Brother   . Alcohol abuse Paternal Grandfather   . Cancer Neg Hx   . Diabetes Neg Hx   . Stroke Neg Hx     SOCIAL HISTORY:   Social History  Substance Use Topics  . Smoking status: Current Every Day Smoker    Packs/day: 1.00    Years: 40.00    Types: Cigarettes  . Smokeless tobacco: Never Used  . Alcohol use No    ALLERGIES:  is allergic to no known allergies.  MEDICATIONS:  Current Outpatient Prescriptions  Medication Sig Dispense Refill  . ibuprofen (ADVIL,MOTRIN) 200 MG tablet Take 200 mg by mouth 2 (two) times daily.    [place] Kitchen levothyroxine (SYNTHROID, LEVOTHROID) 100 MCG tablet TAKE 1 TABLET (100 MCG TOTAL) BY MOUTH DAILY. 30 tablet 5  . lisinopril-hydrochlorothiazide (PRINZIDE,ZESTORETIC) 20-12.5 MG tablet TAKE 1 AND 1/2 TABLET BY MOUTH EVERY DAY 45 tablet 4  . PARoxetine (PAXIL) 20 MG tablet Take 1 tablet (20 mg total) by mouth 2 (two) times daily. 60 tablet 6  . simvastatin (ZOCOR) 40 MG tablet Take 1 tablet (40 mg total) by mouth at bedtime. 30 tablet 5   No current facility-administered medications for this visit.     PHYSICAL EXAMINATION:   BP 137/74 (BP Location: Right Arm, Patient Position: Sitting)  Pulse 67   Temp (!) 96.4 F (35.8 C) (Tympanic)   Wt 210 lb (95.3 kg)   BMI 30.13 kg/m   Filed Weights   03/18/16 1359  Weight: 210 lb (95.3 kg)    GENERAL: Well-nourished well-developed; Alert, no distress and comfortable. Alone. EYES: no pallor or icterus OROPHARYNX: no thrush or ulceration.  NECK: supple, no masses felt LYMPH:  no palpable lymphadenopathy in the cervical, axillary or inguinal regions LUNGS: clear to auscultation and  No wheeze or crackles HEART/CVS: regular rate & rhythm and no murmurs; No lower extremity edema ABDOMEN:abdomen soft, non-tender and normal bowel sounds Musculoskeletal:no cyanosis of digits and no clubbing  PSYCH: alert & oriented x 3 with fluent speech NEURO:  no focal motor/sensory deficits SKIN:  no rashes or significant lesions  LABORATORY DATA:  I have reviewed the data as listed    Component Value Date/Time   NA 137 09/04/2015 1046   NA 141 01/08/2015 1115   K 4.3 09/04/2015 1046   CL 98 09/04/2015 1046   CO2 25 09/04/2015 1046   GLUCOSE 95 09/04/2015 1046   BUN 12 09/04/2015 1046   BUN 13 01/08/2015 1115   CREATININE 1.10 09/04/2015 1046   CALCIUM 9.9 09/04/2015 1046   PROT 8.0 09/04/2015 1046   PROT 7.4 01/08/2015 1115   ALBUMIN 4.3 09/04/2015 1046   ALBUMIN 4.4 01/08/2015 1115   AST 21 09/04/2015 1046   ALT 18 01/03/2016 0947   ALKPHOS 97 09/04/2015 1046   BILITOT 0.4 09/04/2015 1046   BILITOT 0.3 01/08/2015 1115   GFRNONAA 74 09/04/2015 1046   GFRAA 86 09/04/2015 1046    No results found for: SPEP, UPEP  Lab Results  Component Value Date   WBC 9.8 09/04/2015   NEUTROABS 6,272 09/04/2015   HGB 17.1 03/18/2016   HCT 49.9 03/18/2016   MCV 85.3 09/04/2015   PLT 367 09/04/2015      Chemistry      Component Value Date/Time   NA 137 09/04/2015 1046   NA 141 01/08/2015 1115   K 4.3 09/04/2015 1046   CL 98 09/04/2015 1046   CO2 25 09/04/2015 1046   BUN 12 09/04/2015 1046   BUN 13 01/08/2015 1115   CREATININE 1.10 09/04/2015 1046      Component Value Date/Time   CALCIUM 9.9 09/04/2015 1046   ALKPHOS 97 09/04/2015 1046   AST 21 09/04/2015 1046   ALT 18 01/03/2016 0947   BILITOT 0.4 09/04/2015 1046   BILITOT 0.3 01/08/2015 1115        ASSESSMENT & PLAN:  Secondary erythrocytosis # ERYTHROCYTOSIS- Jak-2 NEG; Likely secondary. Feels better post phlebotomy. Interested in continued phlebotomy.   # However proceed with phlebotomy today as hematocrit is 49.9 today.  # Smoking  Counseled;   the patient regarding smoking.   # Discussed re: lung cancer NOT interested.   # H&H every 2 months; phlebotomy as needed.   # follow up in 6 months.      Cammie Sickle, MD 03/18/2016 2:11 PM

## 2016-03-18 NOTE — Assessment & Plan Note (Addendum)
#   ERYTHROCYTOSIS- Jak-2 NEG; Likely secondary. Feels better post phlebotomy. Interested in continued phlebotomy.   # However proceed with phlebotomy today as hematocrit is 49.9 today.  # Smoking  Counseled;   the patient regarding smoking.   # Discussed re: lung cancer NOT interested.   # H&H every 2 months; phlebotomy as needed.   # follow up in 6 months.

## 2016-03-18 NOTE — Progress Notes (Signed)
Patient a difficult stick for IV, after thirty minutes of phlebotomy able to remove 100.  Patient states he only drank about 20 ounces of a Coke today.  Spoke with Dr. Rogue Bussing and OK with that amount and for patient to return in two months.  Patient agreeable with plan.  LJ

## 2016-04-03 ENCOUNTER — Ambulatory Visit (INDEPENDENT_AMBULATORY_CARE_PROVIDER_SITE_OTHER): Payer: Medicaid Other | Admitting: Family Medicine

## 2016-04-03 ENCOUNTER — Encounter: Payer: Self-pay | Admitting: Family Medicine

## 2016-04-03 DIAGNOSIS — E782 Mixed hyperlipidemia: Secondary | ICD-10-CM

## 2016-04-03 DIAGNOSIS — E038 Other specified hypothyroidism: Secondary | ICD-10-CM | POA: Diagnosis not present

## 2016-04-03 DIAGNOSIS — Z5181 Encounter for therapeutic drug level monitoring: Secondary | ICD-10-CM | POA: Diagnosis not present

## 2016-04-03 DIAGNOSIS — R739 Hyperglycemia, unspecified: Secondary | ICD-10-CM | POA: Diagnosis not present

## 2016-04-03 DIAGNOSIS — K921 Melena: Secondary | ICD-10-CM | POA: Diagnosis not present

## 2016-04-03 NOTE — Patient Instructions (Addendum)
Try to use PLAIN allergy medicine without the decongestant Avoid: phenylephrine, phenylpropanolamine, and pseudoephredine  If you need something for aches or pains, try to use Tylenol (acetaminophen) instead of non-steroidals (which include Aleve, ibuprofen, Advil, Motrin, and naproxen); non-steroidals can cause long-term kidney damage  Try to limit saturated fats in your diet (bologna, hot dogs, barbeque, cheeseburgers, hamburgers, steak, bacon, sausage, cheese, etc.) and get more fresh fruits, vegetables, and whole grains  Return for fasting labs next week

## 2016-04-03 NOTE — Assessment & Plan Note (Signed)
Check lipids; encouraged healthier eating 

## 2016-04-03 NOTE — Assessment & Plan Note (Signed)
Monitor liver and kidneys 

## 2016-04-03 NOTE — Assessment & Plan Note (Signed)
Refer back to GI. 

## 2016-04-03 NOTE — Progress Notes (Signed)
BP 130/76   Pulse 71   Temp 97.4 F (36.3 C) (Oral)   Resp 16   Wt 211 lb 6.4 oz (95.9 kg)   SpO2 93%   BMI 30.33 kg/m    Subjective:    Patient ID: Tyler Cunas., male    DOB: 13-May-1960, 56 y.o.   MRN: [id number]  HPI: Tyler Radler. is a 56 y.o. male  Chief Complaint  Patient presents with  . Follow-up   He is here for follow-up He is going to the cancer center; have therapeutic phlebotomies and just had done 1-2 weeks ago  HTN; tries to stay away from salt; no added to food; uses black pepper;   Hx of blood in the stool; was referred to GI; no visible blood since August or July 2017; had colonoscopy in 2013; hx of internal hemorrhoids  Hyperlipidemia; still likes bacon and eggs; not many whole grains  hyperglyemia; sometimes gets dry mouth  Hypothyroidism; on replacement; energy a little down; crashes sometimes with energy; needs a nap; no constipation; skin dry  Depression screen [facility] 2/9 04/03/2016 01/03/2016 09/04/2015 04/09/2015  Decreased Interest 0 0 1 0  Down, Depressed, Hopeless 1 0 3 1  PHQ - 2 Score 1 0 4 1  Altered sleeping - - 1 -  Change in appetite - - 0 -  Feeling bad or failure about yourself  - - 1 -  Trouble concentrating - - 1 -  Moving slowly or fidgety/restless - - 1 -  Suicidal thoughts - - 0 -  PHQ-9 Score - - 8 -   Relevant past medical, surgical, family and social history reviewed Past Medical History:  Diagnosis Date  . AAT (alpha-1-antitrypsin) deficiency ([place])   . Anxiety   . Arthritis    SPINE  . Chronic low back pain   . COPD (chronic obstructive pulmonary disease) ([place])   . Depression   . Goiter   . Gout   . Hyperlipidemia   . Hypertension   . Hypothyroidism   . Insomnia   . Panic attacks   . Thyroid cyst    x 2  . TMJ (temporomandibular joint disorder)   . Tobacco use   . Vitamin D deficiency disease    Past Surgical History:  Procedure Laterality Date  . COLONOSCOPY  2013   Dr. Orest Dikes 2   Family  History  Problem Relation Age of Onset  . Heart disease Father   . Heart failure Father   . Heart attack Father     x 5  . Alcohol abuse Father   . COPD Father   . Dementia Mother   . Hypertension Mother   . Heart attack Brother   . CAD Brother   . Hypertension Brother   . Alcohol abuse Paternal Grandfather   . COPD Maternal Grandfather   . Cancer Neg Hx   . Diabetes Neg Hx   . Stroke Neg Hx    Social History  Substance Use Topics  . Smoking status: Current Every Day Smoker    Packs/day: 1.00    Years: 40.00    Types: Cigarettes  . Smokeless tobacco: Never Used  . Alcohol use No    Interim medical history since last visit reviewed. Allergies and medications reviewed  Review of Systems Per HPI unless specifically indicated above     Objective:    BP 130/76   Pulse 71   Temp 97.4 F (36.3 C) (Oral)  Resp 16   Wt 211 lb 6.4 oz (95.9 kg)   SpO2 93%   BMI 30.33 kg/m   Wt Readings from Last 3 Encounters:  04/03/16 211 lb 6.4 oz (95.9 kg)  03/18/16 210 lb (95.3 kg)  01/03/16 209 lb 3 oz (94.9 kg)    Physical Exam  Constitutional: He appears well-developed and well-nourished.  obese  Eyes: EOM are normal. No scleral icterus.  Neck: No JVD present.  Cardiovascular: Normal rate and regular rhythm.   Pulmonary/Chest: Effort normal and breath sounds normal. No respiratory distress.  Abdominal: Soft. Bowel sounds are normal. He exhibits no distension.  Musculoskeletal: Normal range of motion. He exhibits no edema.  Neurological: He is alert. He displays no tremor. Gait normal.  No tics  Skin: Skin is warm and dry. No pallor.  Psychiatric: His mood appears not anxious. His affect is not blunt. His speech is not delayed. He is not slowed and not withdrawn. Cognition and memory are normal. He does not express impulsivity or inappropriate judgment. He does not exhibit a depressed mood.  Good eye contact with examiner; does not appear anxious today    Results for  orders placed or performed in visit on 03/18/16  Hematocrit [facility])  Result Value Ref Range   HCT 49.9 40.0 - 52.0 %  Hemoglobin ([facility])  Result Value Ref Range   Hemoglobin 17.1 13.0 - 18.0 g/dL      Assessment & Plan:   Problem List Items Addressed This Visit      Digestive   Blood in the stool    Refer back to GI      Relevant Orders   Ambulatory referral to Gastroenterology     Endocrine   Hypothyroidism (Chronic)    Check TSH and adjust dose if needed      Relevant Orders   TSH     Other   Medication monitoring encounter    Monitor liver and kidneys      Relevant Orders   COMPLETE METABOLIC PANEL WITH GFR   Hyperlipidemia (Chronic)    Check lipids; encouraged healthier eating      Relevant Orders   Lipid panel   Hyperglycemia    Return for fasting labs (glucose and A1c)      Relevant Orders   Hemoglobin A1c       Follow up plan: Return in about 6 months (around 10/04/2016) for follow-up.  An after-visit summary was printed and given to the patient at [place].  Please see the patient instructions which may contain other information and recommendations beyond what is mentioned above in the assessment and plan.  No orders of the defined types were placed in this encounter.   Orders Placed This Encounter  Procedures  . COMPLETE METABOLIC PANEL WITH GFR  . Hemoglobin A1c  . Lipid panel  . TSH  . Ambulatory referral to Gastroenterology

## 2016-04-03 NOTE — Assessment & Plan Note (Signed)
Check TSH and adjust dose if needed 

## 2016-04-03 NOTE — Assessment & Plan Note (Signed)
Return for fasting labs (glucose and A1c)

## 2016-04-28 ENCOUNTER — Telehealth: Payer: Self-pay | Admitting: Family Medicine

## 2016-04-28 NOTE — Telephone Encounter (Signed)
Please give patient a gentle reminder that labs are due; thank you

## 2016-04-28 NOTE — Telephone Encounter (Signed)
Pt. Notified.

## 2016-05-18 ENCOUNTER — Inpatient Hospital Stay: Payer: Medicaid Other

## 2016-05-18 ENCOUNTER — Inpatient Hospital Stay: Payer: Medicaid Other | Attending: Internal Medicine

## 2016-05-18 VITALS — BP 140/78 | HR 64 | Temp 98.1°F | Resp 20

## 2016-05-18 DIAGNOSIS — D751 Secondary polycythemia: Secondary | ICD-10-CM | POA: Diagnosis not present

## 2016-05-18 DIAGNOSIS — F17210 Nicotine dependence, cigarettes, uncomplicated: Secondary | ICD-10-CM | POA: Insufficient documentation

## 2016-05-18 LAB — HEMOGLOBIN: Hemoglobin: 17.2 g/dL (ref 13.0–18.0)

## 2016-05-18 LAB — HEMATOCRIT: HEMATOCRIT: 49.6 % (ref 40.0–52.0)

## 2016-05-20 ENCOUNTER — Other Ambulatory Visit: Payer: Self-pay | Admitting: Family Medicine

## 2016-05-20 NOTE — Telephone Encounter (Signed)
Please give patient a gentle reminder to get the labs done that were ordered March 30th; I'll send one refill of med; thank you

## 2016-05-20 NOTE — Telephone Encounter (Signed)
Patient notified

## 2016-06-11 ENCOUNTER — Other Ambulatory Visit: Payer: Self-pay | Admitting: Family Medicine

## 2016-06-11 LAB — COMPLETE METABOLIC PANEL WITH GFR
ALT: 16 U/L (ref 9–46)
AST: 19 U/L (ref 10–35)
Albumin: 4.3 g/dL (ref 3.6–5.1)
Alkaline Phosphatase: 84 U/L (ref 40–115)
BUN: 11 mg/dL (ref 7–25)
CALCIUM: 9.6 mg/dL (ref 8.6–10.3)
CHLORIDE: 103 mmol/L (ref 98–110)
CO2: 28 mmol/L (ref 20–31)
CREATININE: 0.91 mg/dL (ref 0.70–1.33)
GFR, Est Non African American: >89 mL/min (ref >=60)
Glucose, Bld: 97 mg/dL (ref 65–99)
POTASSIUM: 4.7 mmol/L (ref 3.5–5.3)
Sodium: 138 mmol/L (ref 135–146)
Total Bilirubin: 0.5 mg/dL (ref 0.2–1.2)
Total Protein: 7.4 g/dL (ref 6.1–8.1)

## 2016-06-11 LAB — LIPID PANEL
CHOL/HDL RATIO: 4.5 ratio (ref <5.0)
CHOLESTEROL: 125 mg/dL (ref <200)
HDL: 28 mg/dL — AB (ref >40)
LDL CALC: 69 mg/dL (ref <100)
TRIGLYCERIDES: 138 mg/dL (ref <150)
VLDL: 28 mg/dL (ref <30)

## 2016-06-11 MED ORDER — SIMVASTATIN 40 MG PO TABS: 40.0000 mg | ORAL_TABLET | Freq: Every day | ORAL | 5 refills | Status: DC

## 2016-06-12 LAB — HEMOGLOBIN A1C
Hgb A1c MFr Bld: 5.7 % — ABNORMAL HIGH (ref <5.7)
MEAN PLASMA GLUCOSE: 117 mg/dL

## 2016-06-12 LAB — TSH: TSH: 1.85 mIU/L (ref 0.40–4.50)

## 2016-06-24 ENCOUNTER — Other Ambulatory Visit: Payer: Self-pay | Admitting: Family Medicine

## 2016-06-24 NOTE — Telephone Encounter (Signed)
Cr and K+ reviewed June 2018; Rx approved

## 2016-06-29 ENCOUNTER — Other Ambulatory Visit: Payer: Self-pay | Admitting: Family Medicine

## 2016-07-15 ENCOUNTER — Inpatient Hospital Stay: Payer: Medicaid Other | Attending: Internal Medicine

## 2016-07-15 ENCOUNTER — Inpatient Hospital Stay: Payer: Medicaid Other

## 2016-07-15 DIAGNOSIS — D751 Secondary polycythemia: Secondary | ICD-10-CM | POA: Diagnosis not present

## 2016-07-15 DIAGNOSIS — F17210 Nicotine dependence, cigarettes, uncomplicated: Secondary | ICD-10-CM | POA: Diagnosis not present

## 2016-07-15 LAB — HEMATOCRIT: HEMATOCRIT: 48 % (ref 40.0–52.0)

## 2016-07-15 LAB — HEMOGLOBIN: HEMOGLOBIN: 16.6 g/dL (ref 13.0–18.0)

## 2016-07-30 ENCOUNTER — Other Ambulatory Visit: Payer: Self-pay | Admitting: Family Medicine

## 2016-07-30 NOTE — Telephone Encounter (Signed)
Normal thyroid test June 2018; Rx approved

## 2016-09-16 ENCOUNTER — Inpatient Hospital Stay: Payer: Medicaid Other | Admitting: Internal Medicine

## 2016-09-16 ENCOUNTER — Inpatient Hospital Stay: Payer: Medicaid Other

## 2016-10-02 ENCOUNTER — Inpatient Hospital Stay: Payer: Medicaid Other | Attending: Internal Medicine

## 2016-10-02 ENCOUNTER — Inpatient Hospital Stay: Payer: Medicaid Other

## 2016-10-02 ENCOUNTER — Inpatient Hospital Stay: Payer: Medicaid Other | Admitting: Internal Medicine

## 2016-10-09 ENCOUNTER — Ambulatory Visit (INDEPENDENT_AMBULATORY_CARE_PROVIDER_SITE_OTHER): Payer: Medicaid Other | Admitting: Family Medicine

## 2016-10-09 ENCOUNTER — Encounter: Payer: Self-pay | Admitting: Family Medicine

## 2016-10-09 VITALS — BP 132/74 | HR 77 | Temp 97.8°F | Resp 16 | Wt 208.5 lb

## 2016-10-09 DIAGNOSIS — E782 Mixed hyperlipidemia: Secondary | ICD-10-CM | POA: Diagnosis not present

## 2016-10-09 DIAGNOSIS — D751 Secondary polycythemia: Secondary | ICD-10-CM

## 2016-10-09 DIAGNOSIS — Z720 Tobacco use: Secondary | ICD-10-CM

## 2016-10-09 DIAGNOSIS — K921 Melena: Secondary | ICD-10-CM | POA: Diagnosis not present

## 2016-10-09 DIAGNOSIS — Z636 Dependent relative needing care at home: Secondary | ICD-10-CM | POA: Diagnosis not present

## 2016-10-09 DIAGNOSIS — F4001 Agoraphobia with panic disorder: Secondary | ICD-10-CM | POA: Diagnosis not present

## 2016-10-09 DIAGNOSIS — E038 Other specified hypothyroidism: Secondary | ICD-10-CM | POA: Diagnosis not present

## 2016-10-09 DIAGNOSIS — E8801 Alpha-1-antitrypsin deficiency: Secondary | ICD-10-CM

## 2016-10-09 DIAGNOSIS — I10 Essential (primary) hypertension: Secondary | ICD-10-CM

## 2016-10-09 DIAGNOSIS — Z23 Encounter for immunization: Secondary | ICD-10-CM | POA: Diagnosis not present

## 2016-10-09 DIAGNOSIS — F3131 Bipolar disorder, current episode depressed, mild: Secondary | ICD-10-CM | POA: Diagnosis not present

## 2016-10-09 NOTE — Assessment & Plan Note (Signed)
Last thyroid okay, energy is good to fair

## 2016-10-09 NOTE — Patient Instructions (Addendum)
Try to increase your water to at least 64 ounces a day; drink enough water to keep your urine pale yellow Try to increase your fiber  High-Fiber Diet Fiber, also called dietary fiber, is a type of carbohydrate found in fruits, vegetables, whole grains, and beans. A high-fiber diet can have many health benefits. Your health care provider may recommend a high-fiber diet to help:  Prevent constipation. Fiber can make your bowel movements more regular.  Lower your cholesterol.  Relieve hemorrhoids, uncomplicated diverticulosis, or irritable bowel syndrome.  Prevent overeating as part of a weight-loss plan.  Prevent heart disease, type 2 diabetes, and certain cancers.  What is my plan? The recommended daily intake of fiber includes:  38 grams for men under age 43.  64 grams for men over age 62.  39 grams for women under age 69.  1 grams for women over age 93.  You can get the recommended daily intake of dietary fiber by eating a variety of fruits, vegetables, grains, and beans. Your health care provider may also recommend a fiber supplement if it is not possible to get enough fiber through your diet. What do I need to know about a high-fiber diet?  Fiber supplements have not been widely studied for their effectiveness, so it is better to get fiber through food sources.  Always check the fiber content on thenutrition facts label of any prepackaged food. Look for foods that contain at least 5 grams of fiber per serving.  Ask your dietitian if you have questions about specific foods that are related to your condition, especially if those foods are not listed in the following section.  Increase your daily fiber consumption gradually. Increasing your intake of dietary fiber too quickly may cause bloating, cramping, or gas.  Drink plenty of water. Water helps you to digest fiber. What foods can I eat? Grains Whole-grain breads. Multigrain cereal. Oats and oatmeal. Brown rice. Barley.  Bulgur wheat. [place]. Bran muffins. Popcorn. Rye wafer crackers. Vegetables Sweet potatoes. Spinach. Kale. Artichokes. Cabbage. Broccoli. Green peas. Carrots. Squash. Fruits Berries. Pears. Apples. Oranges. Avocados. Prunes and raisins. Dried figs. Meats and Other Protein Sources [place], kidney, pinto, and soy beans. Split peas. Lentils. Nuts and seeds. Dairy Fiber-fortified yogurt. Beverages Fiber-fortified soy milk. Fiber-fortified orange juice. Other Fiber bars. The items listed above may not be a complete list of recommended foods or beverages. Contact your dietitian for more options. What foods are not recommended? Grains White bread. Pasta made with refined flour. White rice. Vegetables Fried potatoes. Canned vegetables. Well-cooked vegetables. Fruits Fruit juice. Cooked, strained fruit. Meats and Other Protein Sources Fatty cuts of meat. Fried Sales executive or fried fish. Dairy Milk. Yogurt. Cream cheese. Sour cream. Beverages Soft drinks. Other Cakes and pastries. Butter and oils. The items listed above may not be a complete list of foods and beverages to avoid. Contact your dietitian for more information. What are some tips for including high-fiber foods in my diet?  Eat a wide variety of high-fiber foods.  Make sure that half of all grains consumed each day are whole grains.  Replace breads and cereals made from refined flour or white flour with whole-grain breads and cereals.  Replace white rice with brown rice, bulgur wheat, or millet.  Start the day with a breakfast that is high in fiber, such as a cereal that contains at least 5 grams of fiber per serving.  Use beans in place of meat in soups, salads, or pasta.  Eat high-fiber snacks, such as  berries, raw vegetables, nuts, or popcorn. This information is not intended to replace advice given to you by your health care provider. Make sure you discuss any questions you have with your health care provider. Document  Released: 12/22/2004 Document Revised: 05/30/2015 Document Reviewed: 06/06/2013 Elsevier Interactive Patient Education  2017 Elsevier Inc.  Dementia Caregiver Guide Dementia is a term used to describe a number of symptoms that affect memory and thinking. The most common symptoms include:  Memory loss.  Trouble with language and communication.  Trouble concentrating.  Poor judgment.  Problems with reasoning.  Child-like behavior and language.  Extreme anxiety.  Angry outbursts.  Wandering from home or public places.  Dementia usually gets worse slowly over time. In the early stages, people with dementia can stay independent and safe with some help. In later stages, they need help with daily tasks such as dressing, grooming, and using the bathroom. How to help the person with dementia cope Dementia can be frightening and confusing. Here are some tips to help the person with dementia cope with changes caused by the disease. General tips  Keep the person on track with his or her routine.  Try to identify areas where the person may need help.  Be supportive, patient, calm, and encouraging.  Gently remind the person that adjusting to changes takes time.  Help with the tasks that the person has asked for help with.  Keep the person involved in daily tasks and decisions as much as possible.  Encourage conversation, but try not to get frustrated or harried if the person struggles to find words or does not seem to appreciate your help. Communication tips  When the person is talking or seems frustrated, make eye contact and hold the person's hand.  Ask specific questions that need yes or no answers.  Use simple words, short sentences, and a calm voice. Only give one direction at a time.  When offering choices, limit them to just 1 or 2.  Avoid correcting the person in a negative way.  If the person is struggling to find the right words, gently try to help him or her. How  to recognize symptoms of stress Symptoms of stress in caregivers include:  Feeling frustrated or angry with the person with dementia.  Denying that the person has dementia or that his or her symptoms will not improve.  Feeling hopeless and unappreciated.  Difficulty sleeping.  Difficulty concentrating.  Feeling anxious, irritable, or depressed.  Developing stress-related health problems.  Feeling like you have too little time for your own life.  Follow these instructions at home:  Make sure that you and the person you are caring for: ? Get regular sleep. ? Exercise regularly. ? Eat regular, nutritious meals. ? Drink enough fluid to keep your urine clear or pale yellow. ? Take over-the-counter and prescription medicines only as told by your health care providers. ? Attend all scheduled health care appointments.  Join a support group with others who are caregivers.  Ask about respite care resources so that you can have a regular break from the stress of caregiving.  Look for signs of stress in yourself and in the person you are caring for. If you notice signs of stress, take steps to manage it.  Consider any safety risks and take steps to avoid them.  Organize medications in a pill box for each day of the week.  Create a plan to handle any legal or financial matters. Get legal or financial advice if needed.  Keep a calendar in a central location to remind the person of appointments or other activities. Tips for reducing the risk of injury  Keep floors clear of clutter. Remove rugs, magazine racks, and floor lamps.  Keep hallways well lit, especially at night.  Put a handrail and nonslip mat in the bathtub or shower.  Put childproof locks on cabinets that contain dangerous items, such as medicines, alcohol, guns, toxic cleaning items, sharp tools or utensils, matches, and lighters.  Put the locks in places where the person cannot see or reach them easily. This will  help ensure that the person does not wander out of the house and get lost.  Be prepared for emergencies. Keep a list of emergency phone numbers and addresses in a convenient area.  Remove car keys and lock garage doors so that the person does not try to get in the car and drive.  Have the person wear a bracelet that tracks locations and identifies the person as having memory problems. This should be worn at all times for safety. Where to find support: Many individuals and organizations offer support. These include:  Support groups for people with dementia and for caregivers.  Counselors or therapists.  Home health care services.  Adult day care centers.  Where to find more information: Alzheimer's Association: [employer] Contact a health care provider if:  The person's health is rapidly getting worse.  You are no longer able to care for the person.  Caring for the person is affecting your physical and emotional health.  The person threatens himself or herself, you, or anyone else. Summary  Dementia is a term used to describe a number of symptoms that affect memory and thinking.  Dementia usually gets worse slowly over time.  Take steps to reduce the person's risk of injury, and to plan for future care.  Caregivers need support, relief from caregiving, and time for their own lives. This information is not intended to replace advice given to you by your health care provider. Make sure you discuss any questions you have with your health care provider. Document Released: 11/26/2015 Document Revised: 11/26/2015 Document Reviewed: 11/26/2015 Elsevier Interactive Patient Education  [employer].

## 2016-10-09 NOTE — Progress Notes (Signed)
BP 132/74   Pulse 77   Temp 97.8 F (36.6 C) (Oral)   Resp 16   Wt 208 lb 8 oz (94.6 kg)   SpO2 96%   BMI 29.92 kg/m    Subjective:    Patient ID: Tyler Cobb., male    DOB: Feb 11, 1960, 56 y.o.   MRN: [id number]  HPI: Fransisco Messmer. is a 56 y.o. male  Chief Complaint  Patient presents with  . Follow-up    HPI He is here for f/u; HTN; controlled; not checking away from doctor; not adding any salt to his food  He has had issues with internal hemorrhoid bleeding; going on for 8 years; had colonoscopy with Dr. Vira Agar; not constipated; just every 2-4 months; not drinking enough water  Bipolar disorder; mother has dementia; stress with caring for his mother; panic attacks and anxiety; continue medicines  High cholesterol; trying to limit bologna and hot dogs; not much cheese; likes eggs Lab Results  Component Value Date   CHOL 125 06/11/2016   HDL 28 (L) 06/11/2016   LDLCALC 69 06/11/2016   TRIG 138 06/11/2016   CHOLHDL 4.5 06/11/2016   Obesity; he has lost 3 pounds; trying to not gain weight, "I don't want to get fat"; "my brother weighs 250 pounds" and he isn't as tall as patient  Smoking; smoking 1 ppd; thinking about quitting, but helps his nerves  His last H/H was normal in July, was seeing hematologist, but patient not sure he'll go back  Prediabetes; last A1c was 5.7; trying to lose a little weight; drinks sugary drinks, tea and Pepsi and Mt. Dew  Hypothyroidism; energy is middle range Lab Results  Component Value Date   TSH 1.85 06/11/2016     Depression screen [facility] 2/9 10/09/2016 04/03/2016 01/03/2016 09/04/2015 04/09/2015  Decreased Interest 0 0 0 1 0  Down, Depressed, Hopeless 0 1 0 3 1  PHQ - 2 Score 0 1 0 4 1  Altered sleeping - - - 1 -  Change in appetite - - - 0 -  Feeling bad or failure about yourself  - - - 1 -  Trouble concentrating - - - 1 -  Moving slowly or fidgety/restless - - - 1 -  Suicidal thoughts - - - 0 -  PHQ-9 Score - - -  8 -    Relevant past medical, surgical, family and social history reviewed Past Medical History:  Diagnosis Date  . AAT (alpha-1-antitrypsin) deficiency ([place])   . Anxiety   . Arthritis    SPINE  . Chronic low back pain   . COPD (chronic obstructive pulmonary disease) ([place])   . Depression   . Goiter   . Gout   . Hyperlipidemia   . Hypertension   . Hypothyroidism   . Insomnia   . Panic attacks   . Thyroid cyst    x 2  . TMJ (temporomandibular joint disorder)   . Tobacco use   . Vitamin D deficiency disease    Past Surgical History:  Procedure Laterality Date  . COLONOSCOPY  2013   Dr. Orest Dikes 2   Family History  Problem Relation Age of Onset  . Heart disease Father   . Heart failure Father   . Heart attack Father        x 5  . Alcohol abuse Father   . COPD Father   . Dementia Mother   . Hypertension Mother   . Heart attack Brother   .  CAD Brother   . Hypertension Brother   . Alcohol abuse Paternal Grandfather   . COPD Maternal Grandfather   . Cancer Neg Hx   . Diabetes Neg Hx   . Stroke Neg Hx    Social History   Social History  . Marital status: Divorced    Spouse name: N/A  . Number of children: N/A  . Years of education: N/A   Occupational History  . Not on file.   Social History Main Topics  . Smoking status: Current Every Day Smoker    Packs/day: 1.00    Years: 40.00    Types: Cigarettes  . Smokeless tobacco: Never Used  . Alcohol use No  . Drug use: No  . Sexual activity: Not Currently   Other Topics Concern  . Not on file   Social History Narrative  . No narrative on file    Interim medical history since last visit reviewed. Allergies and medications reviewed  Review of Systems Per HPI unless specifically indicated above     Objective:    BP 132/74   Pulse 77   Temp 97.8 F (36.6 C) (Oral)   Resp 16   Wt 208 lb 8 oz (94.6 kg)   SpO2 96%   BMI 29.92 kg/m   Wt Readings from Last 3 Encounters:  10/09/16 208 lb 8 oz  (94.6 kg)  04/03/16 211 lb 6.4 oz (95.9 kg)  03/18/16 210 lb (95.3 kg)    Physical Exam  Constitutional: He appears well-developed and well-nourished.  obese  Eyes: EOM are normal. No scleral icterus.  Neck: No JVD present.  Cardiovascular: Normal rate and regular rhythm.   Pulmonary/Chest: Effort normal and breath sounds normal. No respiratory distress.  Abdominal: Soft. Bowel sounds are normal. He exhibits no distension.  Musculoskeletal: Normal range of motion. He exhibits no edema.  Neurological: He is alert. He displays no tremor. Gait normal.  No tics  Skin: Skin is warm and dry. No pallor.  Psychiatric: His mood appears not anxious. His affect is not blunt. His speech is not delayed. He is not slowed and not withdrawn. Cognition and memory are normal. He does not express impulsivity or inappropriate judgment. He does not exhibit a depressed mood.  Good eye contact with examiner; does not appear anxious today    Results for orders placed or performed in visit on 07/15/16  Hematocrit  Result Value Ref Range   HCT 48.0 40.0 - 52.0 %  Hemoglobin  Result Value Ref Range   Hemoglobin 16.6 13.0 - 18.0 g/dL      Assessment & Plan:   Problem List Items Addressed This Visit      Cardiovascular and Mediastinum   Hypertension - Primary (Chronic)    Continue current plan, try DASH guidelines        Respiratory   AAT (alpha-1-antitrypsin) deficiency ([facility])    Saw GI        Digestive   Blood in the stool    Hx of hemorrhoids; already had colonoscopy, exam by GI (Dr. Vira Agar); encouraged increased fiber and water; let me know if not improving        Endocrine   Hypothyroidism (Chronic)    Last thyroid okay, energy is good to fair        Other   Needs flu shot   Relevant Orders   Flu Vaccine QUAD 6+ mos PF IM (Fluarix Quad PF) (Completed)   Tobacco use    Not ready to quit smoking  right now; I am here to help if/when ready      Secondary erythrocytosis    Seeing  hematologist      Hyperlipidemia (Chronic)    Limit eggs to no more than 3 per week; limit fatty meats; check lipids      Bipolar disorder ([place])    Stable; continue regimen      Agoraphobia with panic attacks    Continue with current regimen       Other Visit Diagnoses    Caregiver stress       encouraged him to talk to his mother's doctor about help for her condition; encouraged self-care       Follow up plan: Return in about 2 months (around 12/14/2016) for fasting labs only; return to see Dr. Sanda Klein in 6 months.  An after-visit summary was printed and given to the patient at [place].  Please see the patient instructions which may contain other information and recommendations beyond what is mentioned above in the assessment and plan.  No orders of the defined types were placed in this encounter.   Orders Placed This Encounter  Procedures  . Flu Vaccine QUAD 6+ mos PF IM (Fluarix Quad PF)

## 2016-10-09 NOTE — Assessment & Plan Note (Signed)
Continue current plan, try DASH guidelines

## 2016-10-09 NOTE — Assessment & Plan Note (Signed)
Stable.continue regimen 

## 2016-10-09 NOTE — Assessment & Plan Note (Signed)
Limit eggs to no more than 3 per week; limit fatty meats; check lipids

## 2016-10-09 NOTE — Assessment & Plan Note (Signed)
Saw GI

## 2016-10-09 NOTE — Assessment & Plan Note (Signed)
Hx of hemorrhoids; already had colonoscopy, exam by GI (Dr. Vira Agar); encouraged increased fiber and water; let me know if not improving

## 2016-10-09 NOTE — Assessment & Plan Note (Signed)
Not ready to quit smoking right now; I am here to help if/when ready

## 2016-10-09 NOTE — Assessment & Plan Note (Signed)
Seeing hematologist 

## 2016-10-09 NOTE — Assessment & Plan Note (Signed)
Continue with current regimen

## 2016-11-12 ENCOUNTER — Other Ambulatory Visit: Payer: Self-pay | Admitting: Family Medicine

## 2016-12-28 ENCOUNTER — Other Ambulatory Visit: Payer: Self-pay | Admitting: Family Medicine

## 2017-01-06 ENCOUNTER — Other Ambulatory Visit: Payer: Self-pay | Admitting: Family Medicine

## 2017-01-07 NOTE — Telephone Encounter (Signed)
Last K+ and Cr reviewed; Rx approved 

## 2017-01-19 IMAGING — US US SOFT TISSUE HEAD/NECK
1 series · 14 of 25 positions shown · non-contrast
Comparison: None.

CLINICAL DATA: Hyperthyroidism

EXAM:
THYROID ULTRASOUND
TECHNIQUE: Ultrasound examination of the thyroid gland and adjacent soft
tissues was performed.

[Series 1: us soft tissue head/neck · 0.06mm/px · 14 of 57 slices shown]
[im 1/57]
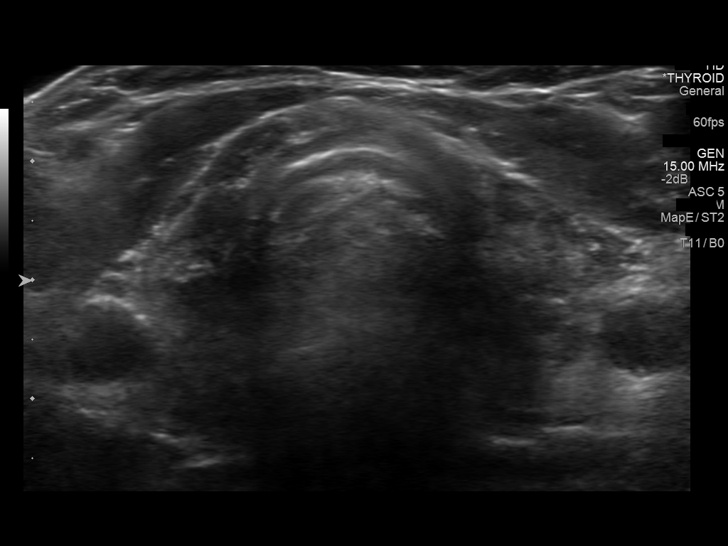
[im 5/57]
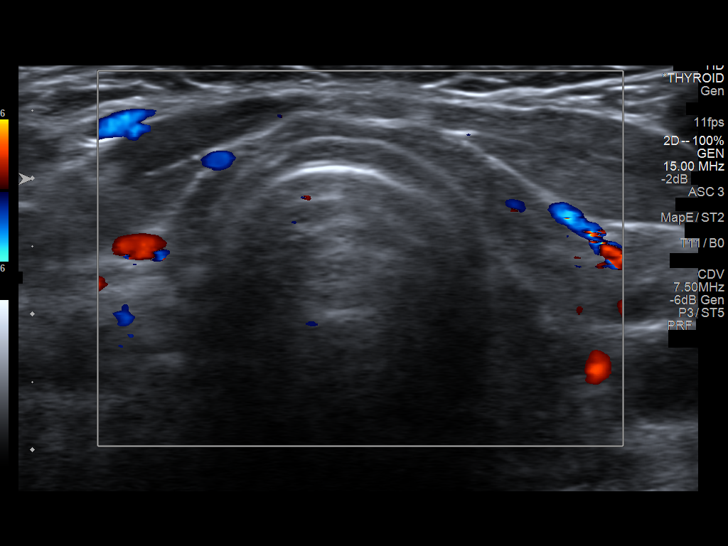
[im 10/57]
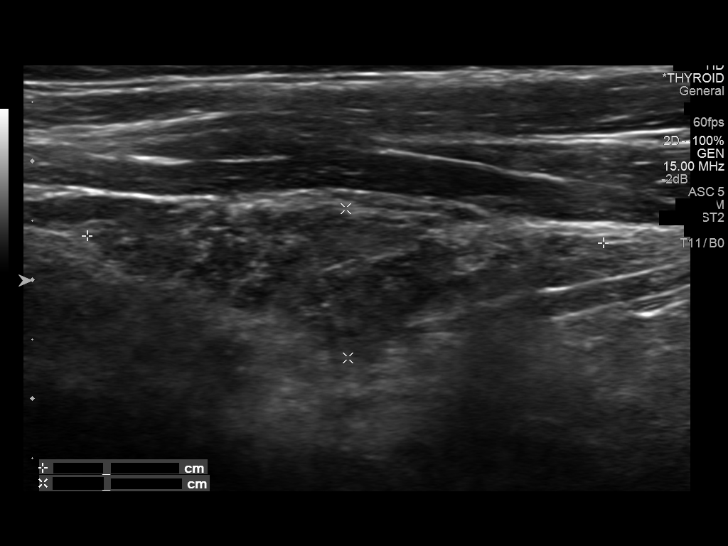
[im 15/57]
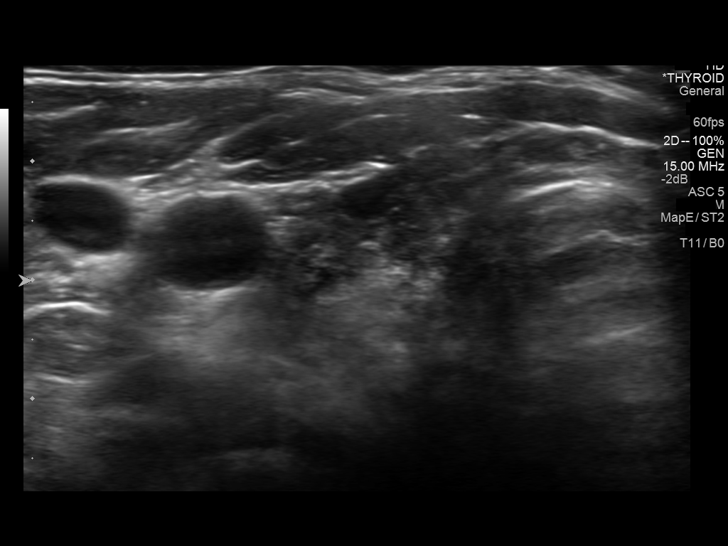
[im 19/57]
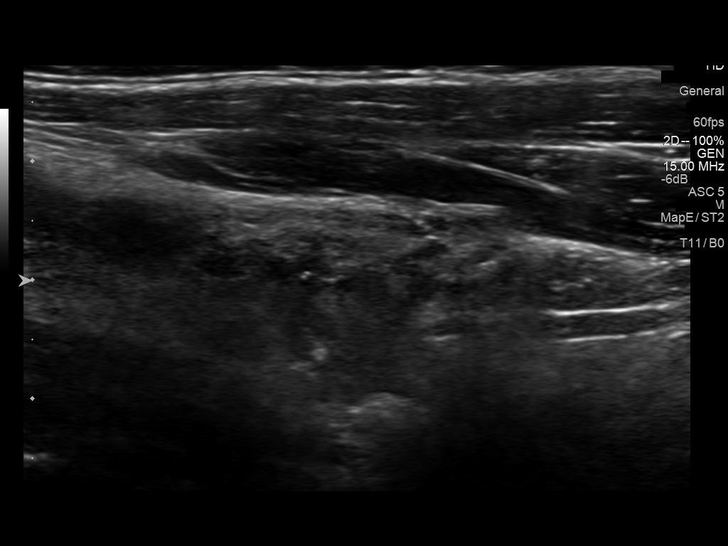
[im 22/57]
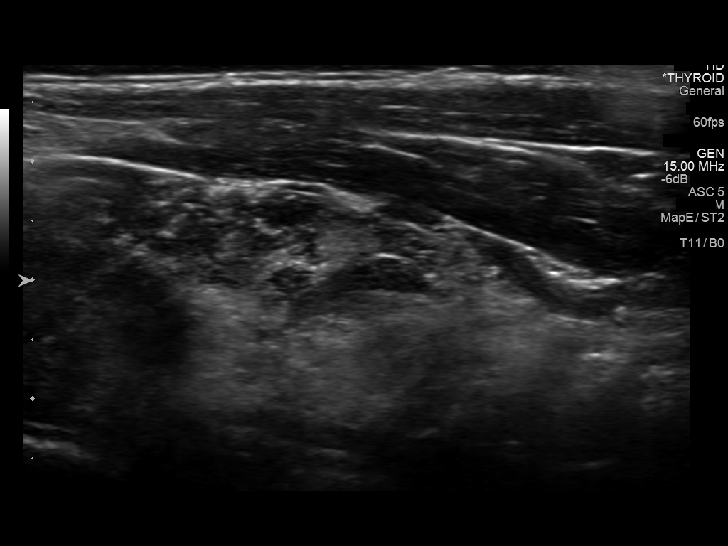
[im 26/57]
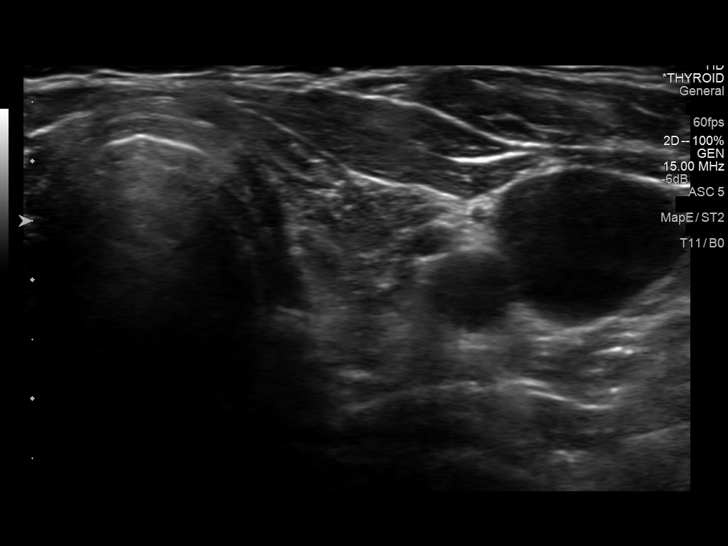
[im 31/57]
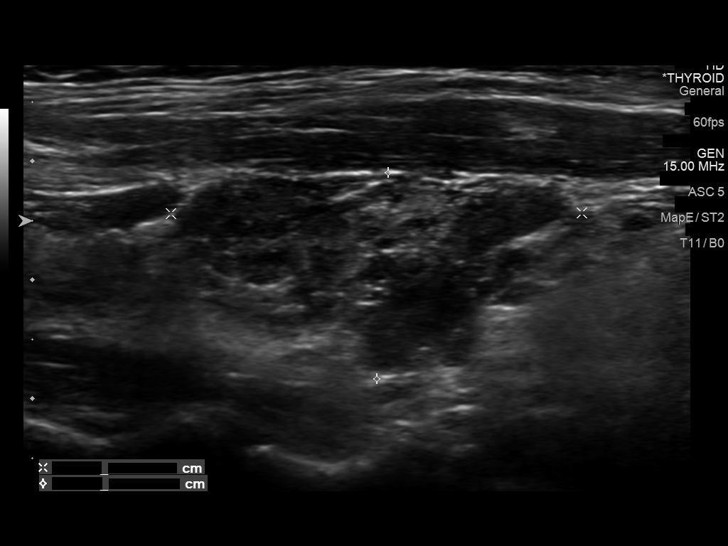
[im 36/57]
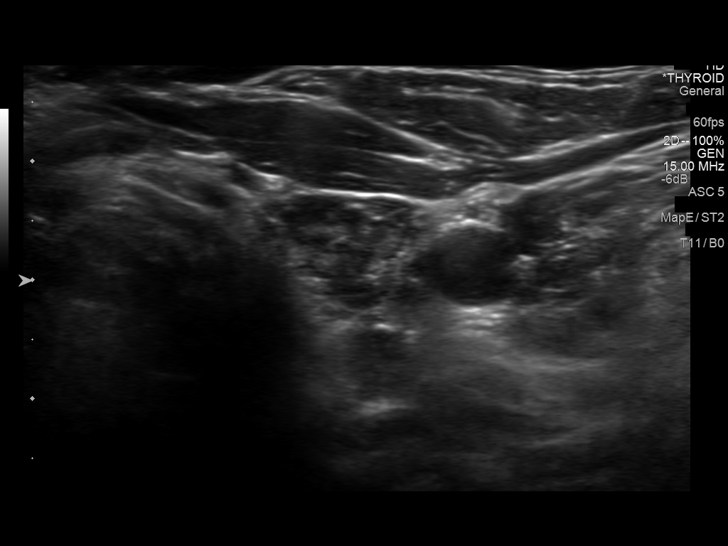
[im 38/57]
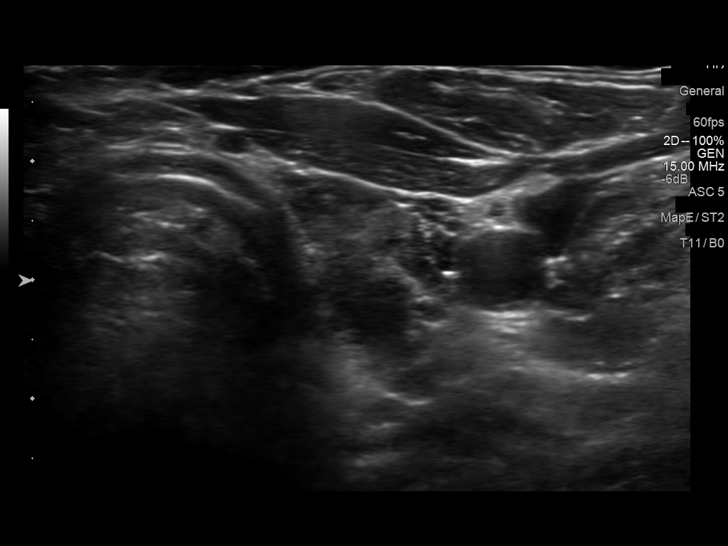
[im 43/57]
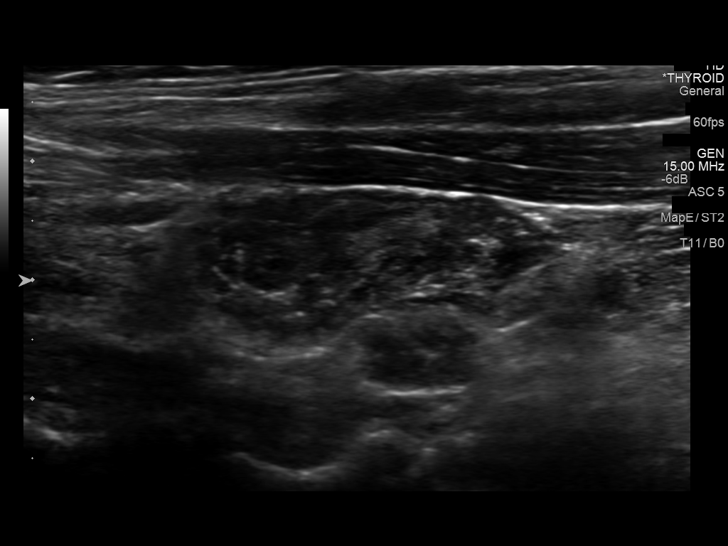
[im 47/57]
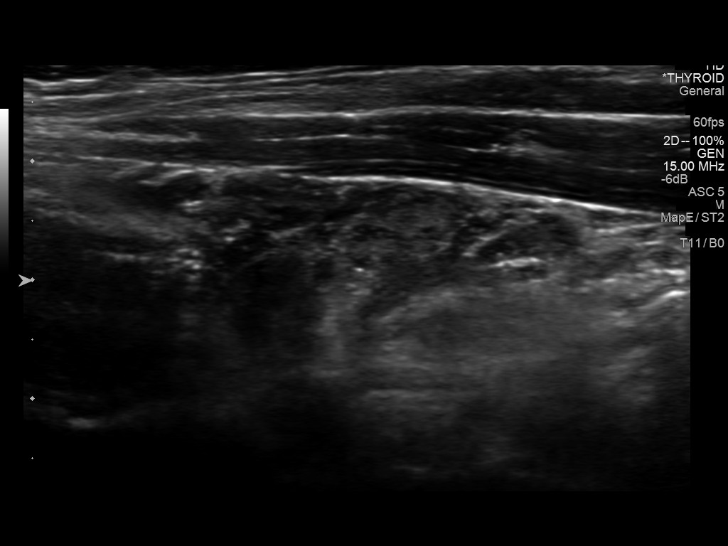
[im 52/57]
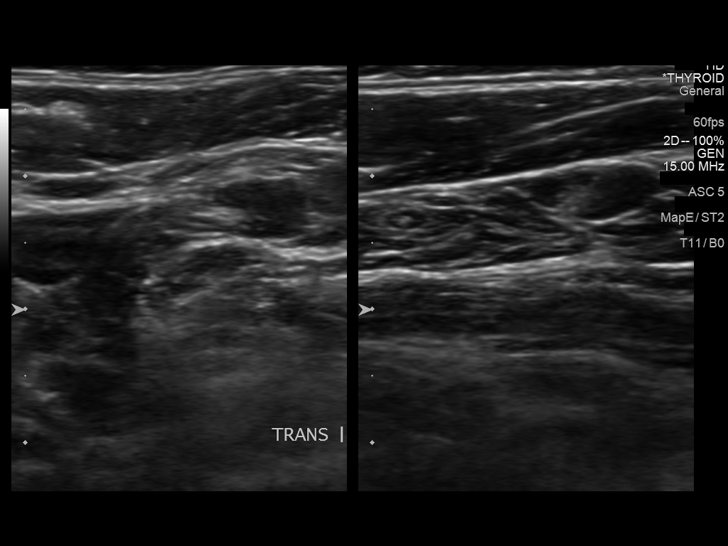
[im 57/57]
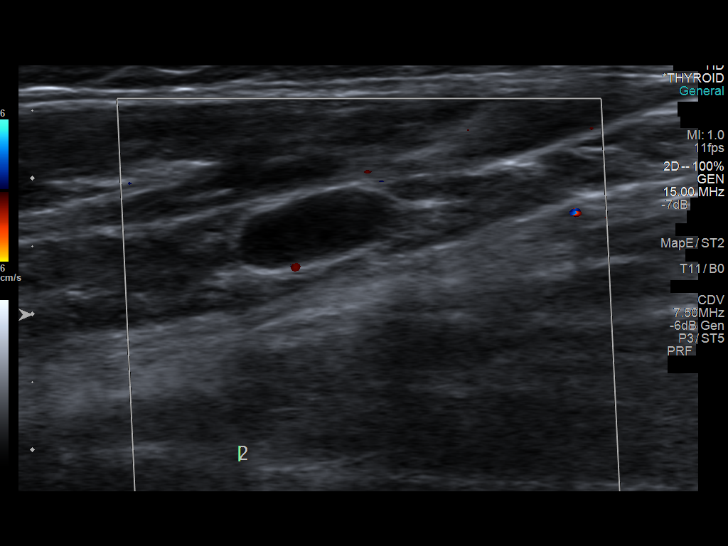

[14 of 25 positions shown; findings below may reference images not displayed]

FINDINGS: Right thyroid lobe

Measurements: 4.4 x 1.3 x 1.1 cm. Heterogeneous tissue without focal
nodule.

Left thyroid lobe

Measurements: 3.5 x 1.7 x 1.5 cm. Heterogeneous tissue without focal
nodule.

Isthmus

Thickness: 2 mm.  No nodules visualized.

Lymphadenopathy

There is a normal-appearing lymph node in the right side of the
neck. A hypoechoic soft tissue area within the left neck measures 12
x 6 x 12 mm. There is no fatty hilum. This is nonspecific.
IMPRESSION: Heterogeneous gland without focal nodule.

There is an abnormal appearing lymph node within the left side of
the neck. Inflammatory are neoplastic etiology cannot be excluded.
Consider short-term follow-up ultrasound to ensure resolution.
Alternatively, fine needle aspiration can be performed.

## 2017-04-09 ENCOUNTER — Ambulatory Visit: Payer: Medicaid Other | Admitting: Family Medicine

## 2017-06-18 ENCOUNTER — Other Ambulatory Visit: Payer: Self-pay | Admitting: Family Medicine

## 2017-06-21 NOTE — Telephone Encounter (Signed)
Lab Results  Component Value Date   TSH 1.85 06/11/2016   Lab Results  Component Value Date   CHOL 125 06/11/2016   HDL 28 (L) 06/11/2016   LDLCALC 69 06/11/2016   TRIG 138 06/11/2016   CHOLHDL 4.5 06/11/2016   Please ask patient to schedule an appointment; last visit was October 2018 Last labs for thyroid and cholesterol were over a year ago I'll refill meds and we look forward to seeing him Thank you

## 2017-06-21 NOTE — Telephone Encounter (Signed)
appt made

## 2017-07-02 ENCOUNTER — Encounter: Payer: Self-pay | Admitting: Family Medicine

## 2017-07-02 ENCOUNTER — Ambulatory Visit: Payer: Medicaid Other | Admitting: Family Medicine

## 2017-07-02 VITALS — BP 134/68 | HR 95 | Temp 98.6°F | Resp 14 | Ht 70.0 in | Wt 204.6 lb

## 2017-07-02 DIAGNOSIS — R739 Hyperglycemia, unspecified: Secondary | ICD-10-CM | POA: Diagnosis not present

## 2017-07-02 DIAGNOSIS — Z720 Tobacco use: Secondary | ICD-10-CM | POA: Diagnosis not present

## 2017-07-02 DIAGNOSIS — I10 Essential (primary) hypertension: Secondary | ICD-10-CM | POA: Diagnosis not present

## 2017-07-02 DIAGNOSIS — E038 Other specified hypothyroidism: Secondary | ICD-10-CM

## 2017-07-02 DIAGNOSIS — F3131 Bipolar disorder, current episode depressed, mild: Secondary | ICD-10-CM

## 2017-07-02 DIAGNOSIS — D751 Secondary polycythemia: Secondary | ICD-10-CM

## 2017-07-02 DIAGNOSIS — E782 Mixed hyperlipidemia: Secondary | ICD-10-CM | POA: Diagnosis not present

## 2017-07-02 DIAGNOSIS — Z5181 Encounter for therapeutic drug level monitoring: Secondary | ICD-10-CM | POA: Diagnosis not present

## 2017-07-02 DIAGNOSIS — K429 Umbilical hernia without obstruction or gangrene: Secondary | ICD-10-CM | POA: Diagnosis not present

## 2017-07-02 DIAGNOSIS — E663 Overweight: Secondary | ICD-10-CM | POA: Diagnosis not present

## 2017-07-02 DIAGNOSIS — R5383 Other fatigue: Secondary | ICD-10-CM

## 2017-07-02 DIAGNOSIS — E559 Vitamin D deficiency, unspecified: Secondary | ICD-10-CM

## 2017-07-02 NOTE — Assessment & Plan Note (Addendum)
Patient politely declined referral to get a sleep study to evaluate for OSA today; he does not want to return to the hematologist; will check CBC and go from there

## 2017-07-02 NOTE — Assessment & Plan Note (Signed)
Check glucose and A1c 

## 2017-07-02 NOTE — Assessment & Plan Note (Signed)
Control is fair; continue medicine; try DASH guidelines

## 2017-07-02 NOTE — Assessment & Plan Note (Signed)
Keep up the good work with weight loss 

## 2017-07-02 NOTE — Assessment & Plan Note (Signed)
Discussed stress; declined seeing psychiatrist or counselor

## 2017-07-02 NOTE — Assessment & Plan Note (Signed)
He is not mentally prepared to quit smoking; I am here to help

## 2017-07-02 NOTE — Patient Instructions (Addendum)
Let's get labs today We'll have you see the surgeon If you have not heard anything from my staff in a week about any orders/referrals/studies from today, please contact us here to follow-up [phone]  Hernia A hernia happens when an organ or tissue inside your body pushes out through a weak spot in the belly (abdomen). Follow these instructions at home:  Avoid stretching or overusing (straining) the muscles near the hernia.  Do not lift anything heavier than 10 lb (4.5 kg).  Use the muscles in your leg when you lift something up. Do not use the muscles in your back.  When you cough, try to cough gently.  Eat a diet that has a lot of fiber. Eat lots of fruits and vegetables.  Drink enough fluids to keep your pee (urine) clear or pale yellow. Try to drink 6-8 glasses of water a day.  Take medicines to make your poop soft (stool softeners) as told by your doctor.  Lose weight, if you are overweight.  Do not use any tobacco products, including cigarettes, chewing tobacco, or electronic cigarettes. If you need help quitting, ask your doctor.  Keep all follow-up visits as told by your doctor. This is important. Contact a doctor if:  The skin by the hernia gets puffy (swollen) or red.  The hernia is painful. Get help right away if:  You have a fever.  You have belly pain that is getting worse.  You feel sick to your stomach (nauseous) or you throw up (vomit).  You cannot push the hernia back in place by gently pressing on it while you are lying down.  The hernia: ? Changes in shape or size. ? Is stuck outside your belly. ? Changes color. ? Feels hard or tender. This information is not intended to replace advice given to you by your health care provider. Make sure you discuss any questions you have with your health care provider. Document Released: 06/11/2009 Document Revised: 05/30/2015 Document Reviewed: 11/01/2013 Elsevier Interactive Patient Education  [employer].

## 2017-07-02 NOTE — Assessment & Plan Note (Signed)
Check TSH and adjust if needed

## 2017-07-02 NOTE — Assessment & Plan Note (Signed)
Check lipids; limit saturated fats 

## 2017-07-02 NOTE — Assessment & Plan Note (Signed)
Check level and replace if neede

## 2017-07-02 NOTE — Progress Notes (Signed)
BP 134/68   Pulse 95   Temp 98.6 F (37 C) (Oral)   Resp 14   Ht 5\' 10"  (1.778 m)   Wt 204 lb 9.6 oz (92.8 kg)   SpO2 94%   BMI 29.36 kg/m    Subjective:    Patient ID: Tyler Cunas., male    DOB: 1960/09/07, 57 y.o.   MRN: [id number]  HPI: Tyler Meriwether. is a 57 y.o. male  Chief Complaint  Patient presents with  . Follow-up    HPI He is here for f/u He is having to look after his mother; takes care of her half of the week and sharing that responsibility with his sister Mood is down; staying tired, not having any energy; just wants to take a nap in the afternoon; not waking up refreshed in the morning; not a good sleep  Vitamin D deficiency; not taking any supplementation right now; tired  Hypothyroidism; no weight gain Lab Results  Component Value Date   TSH 1.85 06/11/2016   High cholesterol; room for improvement with diet, he admits  Secondary erythrocytosis; he does not want to go back to hematologist; he doesn't think it really does any good, just drawing blood; he says I can do that here; he does not want to return  Umbilical hernia; painful; saw the surgeon one time and canceled the surgery; he says it is getting bigger, more uncomfortable  Alpha-1 AT; chronic condition  Smoking; thinks about quitting every day, but doing it so long, just don't know  Depression screen [facility] 2/9 07/02/2017 10/09/2016 04/03/2016 01/03/2016 09/04/2015  Decreased Interest 0 0 0 0 1  Down, Depressed, Hopeless 3 0 1 0 3  PHQ - 2 Score 3 0 1 0 4  Altered sleeping 0 - - - 1  Tired, decreased energy 3 - - - -  Change in appetite 0 - - - 0  Feeling bad or failure about yourself  1 - - - 1  Trouble concentrating 0 - - - 1  Moving slowly or fidgety/restless 0 - - - 1  Suicidal thoughts 0 - - - 0  PHQ-9 Score 7 - - - 8  Difficult doing work/chores Somewhat difficult - - - -    Relevant past medical, surgical, family and social history reviewed Past Medical History:    Diagnosis Date  . AAT (alpha-1-antitrypsin) deficiency ([place])   . Anxiety   . Arthritis    SPINE  . Chronic low back pain   . COPD (chronic obstructive pulmonary disease) ([place])   . Depression   . Goiter   . Gout   . Hyperlipidemia   . Hypertension   . Hypothyroidism   . Insomnia   . Panic attacks   . Thyroid cyst    x 2  . TMJ (temporomandibular joint disorder)   . Tobacco use   . Vitamin D deficiency disease    Past Surgical History:  Procedure Laterality Date  . COLONOSCOPY  2013   Dr. Orest Dikes 2   Family History  Problem Relation Age of Onset  . Heart disease Father   . Heart failure Father   . Heart attack Father        x 5  . Alcohol abuse Father   . COPD Father   . Dementia Mother   . Hypertension Mother   . Heart attack Brother   . CAD Brother   . Hypertension Brother   . Alcohol abuse Paternal Grandfather   .  COPD Maternal Grandfather   . Cancer Neg Hx   . Diabetes Neg Hx   . Stroke Neg Hx    Social History   Tobacco Use  . Smoking status: Current Every Day Smoker    Packs/day: 1.00    Years: 40.00    Pack years: 40.00    Types: Cigarettes  . Smokeless tobacco: Never Used  Substance Use Topics  . Alcohol use: No  . Drug use: No    Interim medical history since last visit reviewed. Allergies and medications reviewed  Review of Systems Per HPI unless specifically indicated above     Objective:    BP 134/68   Pulse 95   Temp 98.6 F (37 C) (Oral)   Resp 14   Ht 5\' 10"  (1.778 m)   Wt 204 lb 9.6 oz (92.8 kg)   SpO2 94%   BMI 29.36 kg/m   Wt Readings from Last 3 Encounters:  07/02/17 204 lb 9.6 oz (92.8 kg)  10/09/16 208 lb 8 oz (94.6 kg)  04/03/16 211 lb 6.4 oz (95.9 kg)    Physical Exam  Constitutional: He appears well-developed and well-nourished. No distress.  HENT:  Head: Normocephalic and atraumatic.  Eyes: EOM are normal. No scleral icterus.  Neck: No thyromegaly present.  Cardiovascular: Normal rate and regular  rhythm.  Pulmonary/Chest: Effort normal and breath sounds normal.  Abdominal: Soft. Bowel sounds are normal. He exhibits no distension. A hernia (umbilical; few dilated blood vessels superficially; tender) is present.    Musculoskeletal: He exhibits no edema.  Neurological: Coordination normal.  Skin: Skin is warm and dry. No pallor.  Psychiatric: He has a normal mood and affect. His behavior is normal. Judgment and thought content normal. His mood appears not anxious. He does not exhibit a depressed mood.  Good eye contact with examiner      Assessment & Plan:   Problem List Items Addressed This Visit      Cardiovascular and Mediastinum   Hypertension (Chronic)    Control is fair; continue medicine; try DASH guidelines        Endocrine   Hypothyroidism (Chronic)    Check TSH and adjust if needed      Relevant Orders   TSH     Other   Vitamin D deficiency disease    Check level and replace if neede      Relevant Orders   VITAMIN D 25 Hydroxy (Vit-D Deficiency, Fractures) (Completed)   Overweight (BMI 25.0-29.9)    Keep up the good work with weight loss      Medication monitoring encounter    Check liver and kidneys      Relevant Orders   COMPLETE METABOLIC PANEL WITH GFR (Completed)   Hyperlipidemia - Primary (Chronic)    Check lipids; limit saturated fats      Relevant Orders   Lipid panel (Completed)   Hyperglycemia    Check glucose and A1c      Relevant Orders   Hemoglobin A1c (Completed)   Umbilical hernia without obstruction and without gangrene (Chronic)    Refer back to surgeon; to ER if s/s of incarceration      Relevant Orders   Ambulatory referral to General Surgery   Tobacco use (Chronic)    He is not mentally prepared to quit smoking; I am here to help      Secondary erythrocytosis (Chronic)    Patient politely declined referral to get a sleep study to evaluate for OSA today; he  does not want to return to the hematologist; will check  CBC and go from there      Relevant Orders   CBC with Differential/Platelet (Completed)   Bipolar disorder ([place]) (Chronic)    Discussed stress; declined seeing psychiatrist or counselor       Other Visit Diagnoses    Other fatigue       Relevant Orders   CBC with Differential/Platelet (Completed)   Vitamin B12 (Completed)       Follow up plan: Return in about 6 months (around 01/01/2018) for follow-up visit with Dr. Sanda Klein.  An after-visit summary was printed and given to the patient at [place].  Please see the patient instructions which may contain other information and recommendations beyond what is mentioned above in the assessment and plan.  No orders of the defined types were placed in this encounter.   Orders Placed This Encounter  Procedures  . CBC with Differential/Platelet  . COMPLETE METABOLIC PANEL WITH GFR  . VITAMIN D 25 Hydroxy (Vit-D Deficiency, Fractures)  . TSH  . Lipid panel  . Hemoglobin A1c  . Vitamin B12  . Ambulatory referral to General Surgery

## 2017-07-02 NOTE — Assessment & Plan Note (Signed)
Check liver and kidneys 

## 2017-07-03 NOTE — Assessment & Plan Note (Signed)
Refer back to surgeon; to ER if s/s of incarceration

## 2017-07-05 LAB — CBC WITH DIFFERENTIAL/PLATELET
BASOS PCT: 1.1 %
Basophils Absolute: 125 cells/uL (ref 0–200)
Eosinophils Absolute: 217 cells/uL (ref 15–500)
Eosinophils Relative: 1.9 %
HCT: 53.9 % — ABNORMAL HIGH (ref 38.5–50.0)
HEMOGLOBIN: 18.2 g/dL — AB (ref 13.2–17.1)
Lymphs Abs: 2474 cells/uL (ref 850–3900)
MCH: 28.3 pg (ref 27.0–33.0)
MCHC: 33.8 g/dL (ref 32.0–36.0)
MCV: 83.8 fL (ref 80.0–100.0)
MONOS PCT: 6.7 %
MPV: 11 fL (ref 7.5–12.5)
NEUTROS ABS: 7820 {cells}/uL — AB (ref 1500–7800)
Neutrophils Relative %: 68.6 %
PLATELETS: 329 10*3/uL (ref 140–400)
RBC: 6.43 10*6/uL — AB (ref 4.20–5.80)
RDW: 14.7 % (ref 11.0–15.0)
Total Lymphocyte: 21.7 %
WBC mixed population: 764 cells/uL (ref 200–950)
WBC: 11.4 10*3/uL — ABNORMAL HIGH (ref 3.8–10.8)

## 2017-07-05 LAB — COMPLETE METABOLIC PANEL WITH GFR
AG RATIO: 1.4 (calc) (ref 1.0–2.5)
ALKALINE PHOSPHATASE (APISO): 96 U/L (ref 40–115)
ALT: 17 U/L (ref 9–46)
AST: 20 U/L (ref 10–35)
Albumin: 4.5 g/dL (ref 3.6–5.1)
BILIRUBIN TOTAL: 0.4 mg/dL (ref 0.2–1.2)
BUN: 16 mg/dL (ref 7–25)
CHLORIDE: 101 mmol/L (ref 98–110)
CO2: 25 mmol/L (ref 20–32)
CREATININE: 1.01 mg/dL (ref 0.70–1.33)
Calcium: 10.2 mg/dL (ref 8.6–10.3)
GFR, Est African American: 94 mL/min/{1.73_m2} (ref >=60)
GFR, Est Non African American: 81 mL/min/{1.73_m2} (ref >=60)
GLOBULIN: 3.2 g/dL (ref 1.9–3.7)
Glucose, Bld: 86 mg/dL (ref 65–139)
Potassium: 4 mmol/L (ref 3.5–5.3)
SODIUM: 138 mmol/L (ref 135–146)
Total Protein: 7.7 g/dL (ref 6.1–8.1)

## 2017-07-05 LAB — VITAMIN B12: VITAMIN B 12: 326 pg/mL (ref 200–1100)

## 2017-07-05 LAB — LIPID PANEL
CHOL/HDL RATIO: 4 (calc) (ref <5.0)
CHOLESTEROL: 127 mg/dL (ref <200)
HDL: 32 mg/dL — ABNORMAL LOW (ref >40)
LDL Cholesterol (Calc): 71 mg/dL (calc)
Non-HDL Cholesterol (Calc): 95 mg/dL (calc) (ref <130)
Triglycerides: 163 mg/dL — ABNORMAL HIGH (ref <150)

## 2017-07-05 LAB — HEMOGLOBIN A1C
HEMOGLOBIN A1C: 5.7 %{Hb} — AB (ref <5.7)
MEAN PLASMA GLUCOSE: 117 (calc)
eAG (mmol/L): 6.5 (calc)

## 2017-07-05 LAB — TSH: TSH: 2.25 mIU/L (ref 0.40–4.50)

## 2017-07-05 LAB — VITAMIN D 25 HYDROXY (VIT D DEFICIENCY, FRACTURES): Vit D, 25-Hydroxy: 30 ng/mL (ref 30–100)

## 2017-07-21 ENCOUNTER — Other Ambulatory Visit: Payer: Self-pay | Admitting: Family Medicine

## 2017-07-22 NOTE — Telephone Encounter (Signed)
Lab Results  Component Value Date   TSH 2.25 07/02/2017

## 2017-08-04 ENCOUNTER — Other Ambulatory Visit: Payer: Self-pay | Admitting: Family Medicine

## 2017-08-05 ENCOUNTER — Ambulatory Visit: Payer: Medicaid Other | Admitting: General Surgery

## 2017-08-05 ENCOUNTER — Encounter: Payer: Self-pay | Admitting: General Surgery

## 2017-08-05 VITALS — BP 150/78 | HR 84 | Resp 12 | Ht 70.0 in | Wt 205.0 lb

## 2017-08-05 DIAGNOSIS — K429 Umbilical hernia without obstruction or gangrene: Secondary | ICD-10-CM

## 2017-08-05 NOTE — Progress Notes (Signed)
Patient ID: Tyler Cunas., male   DOB: 1960/06/21, 57 y.o.   MRN: [id number]  Chief Complaint  Patient presents with  . Other    HPI Tyler Cobb. is a 57 y.o. male  male here today for an evaluation of an umbilical hernia. He states the hernia has been present for years. It has recently started bothering him, It does "pop" out at times and goes in when laying down.He states the area has got bigger since his last visit.  Moves bowels regularl  HPI  Past Medical History:  Diagnosis Date  . AAT (alpha-1-antitrypsin) deficiency ([place])   . Anxiety   . Arthritis    SPINE  . Chronic low back pain   . COPD (chronic obstructive pulmonary disease) ([place])   . Depression   . Goiter   . Gout   . Hyperlipidemia   . Hypertension   . Hypothyroidism   . Insomnia   . Panic attacks   . Thyroid cyst    x 2  . TMJ (temporomandibular joint disorder)   . Tobacco use   . Vitamin D deficiency disease     Past Surgical History:  Procedure Laterality Date  . COLONOSCOPY  2013   Dr. Orest Dikes 2    Family History  Problem Relation Age of Onset  . Heart disease Father   . Heart failure Father   . Heart attack Father        x 5  . Alcohol abuse Father   . COPD Father   . Dementia Mother   . Hypertension Mother   . Heart attack Brother   . CAD Brother   . Hypertension Brother   . Alcohol abuse Paternal Grandfather   . COPD Maternal Grandfather   . Cancer Neg Hx   . Diabetes Neg Hx   . Stroke Neg Hx     Social History Social History   Tobacco Use  . Smoking status: Current Every Day Smoker    Packs/day: 1.00    Years: 40.00    Pack years: 40.00    Types: Cigarettes  . Smokeless tobacco: Never Used  Substance Use Topics  . Alcohol use: No  . Drug use: No    Allergies  Allergen Reactions  . No Known Allergies     Current Outpatient Medications  Medication Sig Dispense Refill  . ibuprofen (ADVIL,MOTRIN) 200 MG tablet Take 200 mg by mouth 2 (two) times daily.     Marland Kitchen levothyroxine (SYNTHROID, LEVOTHROID) 100 MCG tablet TAKE 1 TABLET BY MOUTH EVERY DAY 30 tablet 11  . lisinopril-hydrochlorothiazide (PRINZIDE,ZESTORETIC) 20-12.5 MG tablet TAKE 1 AND 1/2 TABLETS BY MOUTH DAILY. 45 tablet 5  . PARoxetine (PAXIL) 20 MG tablet TAKE 1 TABLET (20 MG TOTAL) BY MOUTH 2 (TWO) TIMES DAILY. 60 tablet 11  . simvastatin (ZOCOR) 40 MG tablet TAKE 1 TABLET BY MOUTH EVERYDAY AT BEDTIME 30 tablet 5   No current facility-administered medications for this visit.     Review of Systems Review of Systems  Constitutional: Negative.   Respiratory: Negative.   Cardiovascular: Negative.     Blood pressure (!) 150/78, pulse 84, resp. rate 12, height 5\' 10"  (1.778 m), weight 205 lb (93 kg).  Physical Exam Physical Exam  Constitutional: He is oriented to person, place, and time. He appears well-developed and well-nourished.  Eyes: Conjunctivae are normal. No scleral icterus.  Neck: Normal range of motion.  Cardiovascular: Normal rate, regular rhythm and normal heart sounds.  Pulmonary/Chest:  Effort normal and breath sounds normal.  Abdominal: Soft. Normal appearance. A hernia (Umbilical hernia) is present.  Lymphadenopathy:    He has no cervical adenopathy.  Neurological: He is alert and oriented to person, place, and time.  Skin: Skin is warm and dry.    Data Reviewed CBC obtained July 02, 2017 showed an elevated serum hemoglobin of 18.2 with a hematocrit of 53.9.  White blood cell count 11,400 based bar with a normal differential.  Platelet count of 329,000.  Comprehensive metabolic panel of the same date was notable for a creatinine of 1.0 with an estimated GFR of 81.  Normal electrolytes.  Assessment    Umbilical hernia.    Plan  Hernia precautions and incarceration were discussed with the patient. If they develop symptoms of an incarcerated hernia, they were encouraged to seek prompt medical attention.  I have recommended repair of the hernia  outpatient  basis based after his polycythemia is addressed with phlebotomy.  I do not at present anticipate the requirement for mesh repair, but it is difficult to assess the hernia size.  The patient had been evaluated and undergone phlebotomy with Dr. Rogue Bussing in the past but refuses to return to that physician.  Last phlebotomy was about March 2018.  Review of that in office note showed no discord between patient and physician.   Informal discussion with anesthesia suggested that his hemoglobin should be ideally below 16.5 prior to elective surgery to minimize the risk for acute thrombosis with the potential for stroke.  The patient reported he had tried to change physicians within the hematology group but had been told this was not possible.  I am investigating whether this could be arranged to the patient could undergo a phlebotomy and proceed to hernia repair.  We discussed going to any hematology group in [place] which he flatly  declined.  The hernia is not significantly different than last year when he canceled surgery for reasons unknown to me.  I cannot move forward with an elective procedure until his elevated hemoglobin is resolved.  The risk of infection was reviewed. The role of prosthetic mesh to minimize the risk of recurrence was reviewed if the defect is significantly over 2 cm in size.  The patient is aware that smoking is likely a major contributing factor to his polycythemia, and that it could take months to resolve even if he stop smoking completely.  He will have to decide how important it is for him to have his hernia fixed. Patient to see someone over at the cancer center and scheduled hernia surgery.   HPI, Physical Exam, Assessment and Plan have been scribed under the direction and in the presence of Tyler Ard, MD.  Gaspar Cola, CMA  I have completed the exam and reviewed the above documentation for accuracy and completeness.  I agree with the above.  Development worker, community has been used and any errors in dictation or transcription are unintentional.  Tyler Cobb, M.D., F.A.C.S.   Forest Gleason Brandy Kabat 08/06/2017, 5:03 PM

## 2017-08-05 NOTE — Patient Instructions (Signed)
Umbilical Hernia, Adult A hernia is a bulge of tissue that pushes through an opening between muscles. An umbilical hernia happens in the abdomen, near the belly button (umbilicus). The hernia may contain tissues from the small intestine, large intestine, or fatty tissue covering the intestines (omentum). Umbilical hernias in adults tend to get worse over time, and they require surgical treatment. There are several types of umbilical hernias. You may have:  A hernia located just above or below the umbilicus (indirect hernia). This is the most common type of umbilical hernia in adults.  A hernia that forms through an opening formed by the umbilicus (direct hernia).  A hernia that comes and goes (reducible hernia). A reducible hernia may be visible only when you strain, lift something heavy, or cough. This type of hernia can be pushed back into the abdomen (reduced).  A hernia that traps abdominal tissue inside the hernia (incarcerated hernia). This type of hernia cannot be reduced.  A hernia that cuts off blood flow to the tissues inside the hernia (strangulated hernia). The tissues can start to die if this happens. This type of hernia requires emergency treatment.  What are the causes? An umbilical hernia happens when tissue inside the abdomen presses on a weak area of the abdominal muscles. What increases the risk? You may have a greater risk of this condition if you:  Are obese.  Have had several pregnancies.  Have a buildup of fluid inside your abdomen (ascites).  Have had surgery that weakens the abdominal muscles.  What are the signs or symptoms? The main symptom of this condition is a painless bulge at or near the belly button. A reducible hernia may be visible only when you strain, lift something heavy, or cough. Other symptoms may include:  Dull pain.  A feeling of pressure.  Symptoms of a strangulated hernia may include:  Pain that gets increasingly worse.  Nausea and  vomiting.  Pain when pressing on the hernia.  Skin over the hernia becoming red or purple.  Constipation.  Blood in the stool.  How is this diagnosed? This condition may be diagnosed based on:  A physical exam. You may be asked to cough or strain while standing. These actions increase the pressure inside your abdomen and force the hernia through the opening in your muscles. Your health care provider may try to reduce the hernia by pressing on it.  Your symptoms and medical history.  How is this treated? Surgery is the only treatment for an umbilical hernia. Surgery for a strangulated hernia is done as soon as possible. If you have a small hernia that is not incarcerated, you may need to lose weight before having surgery. Follow these instructions at home:  Lose weight, if told by your health care provider.  Do not try to push the hernia back in.  Watch your hernia for any changes in color or size. Tell your health care provider if any changes occur.  You may need to avoid activities that increase pressure on your hernia.  Do not lift anything that is heavier than 10 lb (4.5 kg) until your health care provider says that this is safe.  Take over-the-counter and prescription medicines only as told by your health care provider.  Keep all follow-up visits as told by your health care provider. This is important. Contact a health care provider if:  Your hernia gets larger.  Your hernia becomes painful. Get help right away if:  You develop sudden, severe pain near the   area of your hernia.  You have pain as well as nausea or vomiting.  You have pain and the skin over your hernia changes color.  You develop a fever. This information is not intended to replace advice given to you by your health care provider. Make sure you discuss any questions you have with your health care provider. Document Released: 05/24/2015 Document Revised: 08/25/2015 Document Reviewed:  05/24/2015 Elsevier Interactive Patient Education  2018 [employer]  

## 2017-08-09 ENCOUNTER — Telehealth: Payer: Self-pay | Admitting: Internal Medicine

## 2017-08-09 NOTE — Telephone Encounter (Signed)
Spoke to Dr.Byrnett; awaiting hernia surgery; Hx of secondary erythrocytosis. Recommend Phlebotomy weekly x 2; to bring hematocrit < 50 prior to hernia surgery.  Pt requests seeing another physician [last seen me in march 2018]. Needs to see another physician in 1 -2 months/cbc/possible phlebotomy.   Mario/Stacey- please advise. Thx  GB

## 2017-08-09 NOTE — Telephone Encounter (Signed)
  OK to see.  M

## 2017-08-13 ENCOUNTER — Other Ambulatory Visit: Payer: Self-pay | Admitting: *Deleted

## 2017-08-13 ENCOUNTER — Inpatient Hospital Stay: Payer: Medicaid Other | Attending: Hematology and Oncology

## 2017-08-13 ENCOUNTER — Inpatient Hospital Stay: Payer: Medicaid Other

## 2017-08-13 VITALS — BP 128/70 | HR 66 | Temp 96.6°F | Resp 20

## 2017-08-13 DIAGNOSIS — D751 Secondary polycythemia: Secondary | ICD-10-CM

## 2017-08-13 LAB — CBC WITH DIFFERENTIAL/PLATELET
BASOS ABS: 0.1 10*3/uL (ref 0–0.1)
Basophils Relative: 1 %
EOS PCT: 3 %
Eosinophils Absolute: 0.3 10*3/uL (ref 0–0.7)
HEMATOCRIT: 49.3 % (ref 40.0–52.0)
Hemoglobin: 16.8 g/dL (ref 13.0–18.0)
LYMPHS PCT: 28 %
Lymphs Abs: 3.2 10*3/uL (ref 1.0–3.6)
MCH: 29.5 pg (ref 26.0–34.0)
MCHC: 34 g/dL (ref 32.0–36.0)
MCV: 86.9 fL (ref 80.0–100.0)
Monocytes Absolute: 0.8 10*3/uL (ref 0.2–1.0)
Monocytes Relative: 7 %
NEUTROS ABS: 6.9 10*3/uL — AB (ref 1.4–6.5)
NEUTROS PCT: 61 %
PLATELETS: 374 10*3/uL (ref 150–440)
RBC: 5.68 MIL/uL (ref 4.40–5.90)
RDW: 14.9 % — ABNORMAL HIGH (ref 11.5–14.5)
WBC: 11.3 10*3/uL — AB (ref 3.8–10.6)

## 2017-08-13 NOTE — Progress Notes (Signed)
Hct: 49.3. NP, Honor Loh, notified. Per NP order: proceed with 300 ml phlebotomy today, order present in supportive therapy plan.

## 2017-09-11 NOTE — Progress Notes (Signed)
[facility] day:  09/13/2017  Chief Complaint: Tyler Cobb. is a 57 y.o. male with erythrocytosis who is seen for new patient assessment.  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  He was initially followed by Dr. Ma Hillock.  Work-up in 11/2013 was negative for JAK2.  BCR-ABL and flow cytometry were negative.  Hematocrit at presentation was 55.  He underwent phlebotomy if his hematocrit was > 48.  He has been followed by Dr. Rogue Bussing since 05/13/2015.  He was noted to have chronic fatigue and chronic intermittent headaches.  Symptoms had not significantly improved post phlebotomies.  He was last seen by Dr. Rogue Bussing on 03/18/2016.  He felt better post phlebotomy.  Hematocrit was 49.9.  He underwent phlebotomy of 100 cc secondary to IV access issues. He underwent phlebotomy of 300 cc on 05/18/2016 and 08/13/2017.  Hematocrit was 49.6 and hemoglobin 17.2 on 05/18/2017.   Hematocrit was 49.3 and hemoglobin 16.8 on 08/13/2017.  The patient was seen by Dr. Bary Castilla on 08/05/2017 for correction of an umbilical hernia.  Recommendation by anesthesia was a hemoglobin < 16.5 prior to elective surgery to minimize the risk of acute thrombosis.  B12 was 326 (low normal) on 07/02/2017.  TSH was 2.25 on 07/02/2017.  Symptomatically, he feels "ok" some days.  He has a chronic headache.  He feels tired a lot.  He denies any B symptoms.  He eats 2x/day (breakfast and a late dinner).  He denies any bleeding except for hemorrhoids.   Past Medical History:  Diagnosis Date  . AAT (alpha-1-antitrypsin) deficiency ([place])   . Anxiety   . Arthritis    SPINE  . Chronic low back pain   . COPD (chronic obstructive pulmonary disease) ([place])   . Depression   . Goiter   . Gout   . Hyperlipidemia   . Hypertension   . Hypothyroidism   . Insomnia   . Panic attacks   . Thyroid cyst    x 2  . TMJ (temporomandibular joint  disorder)   . Tobacco use   . Vitamin D deficiency disease     Past Surgical History:  Procedure Laterality Date  . COLONOSCOPY  2013   Dr. Orest Dikes 2    Family History  Problem Relation Age of Onset  . Heart disease Father   . Heart failure Father   . Heart attack Father        x 5  . Alcohol abuse Father   . COPD Father   . Dementia Mother   . Hypertension Mother   . Heart attack Brother   . CAD Brother   . Hypertension Brother   . Alcohol abuse Paternal Grandfather   . COPD Maternal Grandfather   . Cancer Neg Hx   . Diabetes Neg Hx   . Stroke Neg Hx     Social History:  reports that he has been smoking cigarettes. He has a 40.00 pack-year smoking history. He has never used smokeless tobacco. He reports that he does not drink alcohol or use drugs.  He has smoked 1 pack/day x 43 years.  Contact number [phone]. He is on disability (mental and chronic back pain).  The patient is alone today.  Allergies:  Allergies  Allergen Reactions  . No Known Allergies     Current Medications: Current Outpatient Medications  Medication Sig Dispense Refill  . ibuprofen (ADVIL,MOTRIN) 200 MG tablet Take 200  mg by mouth 2 (two) times daily.    Marland Kitchen levothyroxine (SYNTHROID, LEVOTHROID) 100 MCG tablet TAKE 1 TABLET BY MOUTH EVERY DAY 30 tablet 11  . lisinopril-hydrochlorothiazide (PRINZIDE,ZESTORETIC) 20-12.5 MG tablet TAKE 1 AND 1/2 TABLETS BY MOUTH DAILY. 45 tablet 5  . PARoxetine (PAXIL) 20 MG tablet TAKE 1 TABLET (20 MG TOTAL) BY MOUTH 2 (TWO) TIMES DAILY. 60 tablet 11  . simvastatin (ZOCOR) 40 MG tablet TAKE 1 TABLET BY MOUTH EVERYDAY AT BEDTIME 30 tablet 5   No current facility-administered medications for this visit.     Review of Systems:  GENERAL:  Feels "ok" some days.  Tired a lot.  No fevers, sweats or weight loss. PERFORMANCE STATUS (ECOG):  2 HEENT:  No visual changes, runny nose, sore throat, mouth sores or tenderness. Lungs: No shortness of breath or cough.  No  hemoptysis.  Denies sleep apnea. Cardiac:  No chest pain, palpitations, orthopnea, or PND. GI:  No nausea, vomiting, diarrhea, constipation, melena or hematochezia.  Internal hemorrhoids.  Colonoscopy x 2 (2010 and 2013; Dr Vira Agar). GU:  No urgency, frequency, dysuria, or hematuria.  Voids at night.   Musculoskeletal:  Back pain.  No joint pain.  No muscle tenderness. Extremities:  No pain or swelling. Skin:  No rashes or skin changes. Neuro:  Headache. No numbness or weakness, balance or coordination issues. Endocrine:  No diabetes.  Hypothyroid on Synthroid.  No hot flashes or night sweats. Psych:  No mood changes, depression or anxiety. Pain:  No focal pain. Review of systems:  All other systems reviewed and found to be negative.  Physical Exam: Blood pressure (!) 146/76, pulse 67, temperature 99.7 F (37.6 C), temperature source Tympanic, resp. rate 18, weight 206 lb 8 oz (93.7 kg). GENERAL:  Well developed, well nourished, gentleman sitting comfortably in the exam room in no acute distress. MENTAL STATUS:  Alert and oriented to person, place and time. HEAD:  Wearing a cap.  Brown hair.  Mustache. Normocephalic, atraumatic, face symmetric, no Cushingoid features. EYES:  Brown eyes.  Pupils equal round and reactive to light and accomodation.  No conjunctivitis or scleral icterus. ENT:  Oropharynx clear without lesion.  Tongue normal. Mucous membranes moist.  RESPIRATORY:  Clear to auscultation without rales, wheezes or rhonchi. CARDIOVASCULAR:  Regular rate and rhythm without murmur, rub or gallop. ABDOMEN:  Reducible umbilical hernia.  Soft, non-tender, with active bowel sounds, and no hepatosplenomegaly.  No masses. SKIN:  No rashes, ulcers or lesions. EXTREMITIES: No edema, no skin discoloration or tenderness.  No palpable cords. LYMPH NODES: No palpable cervical, supraclavicular, axillary or inguinal adenopathy  NEUROLOGICAL: Unremarkable. PSYCH:  Appropriate.   Appointment on  09/13/2017  Component Date Value Ref Range Status  . WBC 09/13/2017 11.0* 3.8 - 10.6 K/uL Final  . RBC 09/13/2017 5.54  4.40 - 5.90 MIL/uL Final  . Hemoglobin 09/13/2017 16.1  13.0 - 18.0 g/dL Final  . HCT 09/13/2017 48.1  40.0 - 52.0 % Final  . MCV 09/13/2017 86.9  80.0 - 100.0 fL Final  . MCH 09/13/2017 29.1  26.0 - 34.0 pg Final  . MCHC 09/13/2017 33.5  32.0 - 36.0 g/dL Final  . RDW 09/13/2017 14.5  11.5 - 14.5 % Final  . Platelets 09/13/2017 385  150 - 440 K/uL Final  . Neutrophils Relative % 09/13/2017 71  % Final  . Neutro Abs 09/13/2017 7.8* 1.4 - 6.5 K/uL Final  . Lymphocytes Relative 09/13/2017 21  % Final  . Lymphs Abs 09/13/2017 2.3  1.0 - 3.6 K/uL Final  . Monocytes Relative 09/13/2017 6  % Final  . Monocytes Absolute 09/13/2017 0.6  0.2 - 1.0 K/uL Final  . Eosinophils Relative 09/13/2017 2  % Final  . Eosinophils Absolute 09/13/2017 0.2  0 - 0.7 K/uL Final  . Basophils Relative 09/13/2017 2  % Final  . Basophils Absolute 09/13/2017 0.2* 0 - 0.1 K/uL Final   Performed at [facility], [address] [place],  [address]    Assessment:  Avraj Lindroth. is a 57 y.o. 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  He denies sleep apnea.  He does not take testosterone.  Work-up in 11/2013 was negative:  JAK2, BCR-ABL, and flow cytometry.  He undergoes phlebotomy if his hematocrit is > 48 (last 08/13/2017).  He has a low normal B12.  B12 was 326 on 07/02/2017.  TSH was normal on 07/02/2017.  He has an umbilical hernia that requires correction.  Symptomatically, he feels "ok" some days.  He is fatigued a lot.  He notes urinary frequency.  Exam reveals no adenopathy or hepatosplenomegaly.  He has a reducible hernia.  B12 level is 184.  Plan: 1.  Labs today:  CBC with diff, B12, folate, urinalysis. 2.  Secondary erythrocytosis:  Goal hemoglobin < 16 for upcoming surgery.  Patient to follow-up with Dr. Bary Castilla.  Phlebotomy program  continues. 3.  B12 deficiency:  Encourage patient to take oral B12.  Check level  today.  Check folate.  Follow-up B12 level in 1 month after oral B12 restarted. 4.  Smoking:  Discuss smoking cessation.  Discuss low dose chest CT program - declines. 5.  Health care maintenance:  Discuss colonoscopy (2010 and 2013) with Dr. Vira Agar. 6.  RTC in 6 months for MD assessment and labs (CBC with diff, ferritin) +/- phlebotomy.   Lequita Asal, MD  09/13/2017, 1:56 PM

## 2017-09-12 ENCOUNTER — Other Ambulatory Visit: Payer: Self-pay | Admitting: Hematology and Oncology

## 2017-09-12 DIAGNOSIS — E538 Deficiency of other specified B group vitamins: Secondary | ICD-10-CM

## 2017-09-12 DIAGNOSIS — D751 Secondary polycythemia: Secondary | ICD-10-CM

## 2017-09-13 ENCOUNTER — Encounter: Payer: Self-pay | Admitting: Hematology and Oncology

## 2017-09-13 ENCOUNTER — Inpatient Hospital Stay (HOSPITAL_BASED_OUTPATIENT_CLINIC_OR_DEPARTMENT_OTHER): Payer: Medicaid Other | Admitting: Hematology and Oncology

## 2017-09-13 ENCOUNTER — Inpatient Hospital Stay: Payer: Medicaid Other

## 2017-09-13 ENCOUNTER — Inpatient Hospital Stay: Payer: Medicaid Other | Attending: Hematology and Oncology

## 2017-09-13 VITALS — BP 146/76 | HR 67 | Temp 99.7°F | Resp 18 | Wt 206.5 lb

## 2017-09-13 DIAGNOSIS — R7989 Other specified abnormal findings of blood chemistry: Secondary | ICD-10-CM

## 2017-09-13 DIAGNOSIS — R350 Frequency of micturition: Secondary | ICD-10-CM

## 2017-09-13 DIAGNOSIS — E538 Deficiency of other specified B group vitamins: Secondary | ICD-10-CM | POA: Diagnosis not present

## 2017-09-13 DIAGNOSIS — D751 Secondary polycythemia: Secondary | ICD-10-CM

## 2017-09-13 DIAGNOSIS — Z720 Tobacco use: Secondary | ICD-10-CM

## 2017-09-13 LAB — CBC WITH DIFFERENTIAL/PLATELET
Basophils Absolute: 0.2 10*3/uL — ABNORMAL HIGH (ref 0–0.1)
Basophils Relative: 2 %
Eosinophils Absolute: 0.2 10*3/uL (ref 0–0.7)
Eosinophils Relative: 2 %
HCT: 48.1 % (ref 40.0–52.0)
Hemoglobin: 16.1 g/dL (ref 13.0–18.0)
Lymphocytes Relative: 21 %
Lymphs Abs: 2.3 10*3/uL (ref 1.0–3.6)
MCH: 29.1 pg (ref 26.0–34.0)
MCHC: 33.5 g/dL (ref 32.0–36.0)
MCV: 86.9 fL (ref 80.0–100.0)
Monocytes Absolute: 0.6 10*3/uL (ref 0.2–1.0)
Monocytes Relative: 6 %
Neutro Abs: 7.8 10*3/uL — ABNORMAL HIGH (ref 1.4–6.5)
Neutrophils Relative %: 71 %
Platelets: 385 10*3/uL (ref 150–440)
RBC: 5.54 MIL/uL (ref 4.40–5.90)
RDW: 14.5 % (ref 11.5–14.5)
WBC: 11 10*3/uL — ABNORMAL HIGH (ref 3.8–10.6)

## 2017-09-13 LAB — URINALYSIS, COMPLETE (UACMP) WITH MICROSCOPIC
Bacteria, UA: NONE SEEN
Bilirubin Urine: NEGATIVE
Glucose, UA: NEGATIVE mg/dL
Ketones, ur: NEGATIVE mg/dL
Leukocytes, UA: NEGATIVE
Nitrite: NEGATIVE
Protein, ur: NEGATIVE mg/dL
Specific Gravity, Urine: 1.003 — ABNORMAL LOW (ref 1.005–1.030)
Squamous Epithelial / HPF: NONE SEEN (ref 0–5)
pH: 7 (ref 5.0–8.0)

## 2017-09-13 LAB — FOLATE: Folate: 9.6 ng/mL (ref >5.9)

## 2017-09-13 NOTE — Progress Notes (Signed)
Former patient of Dr. Rogue Bussing.  Patent has 99.7 Temp.  States he has not felt well today.  C/o left abdominal pain.  States he is up frequently at night to urinate.

## 2017-09-14 ENCOUNTER — Telehealth: Payer: Self-pay | Admitting: *Deleted

## 2017-09-14 ENCOUNTER — Other Ambulatory Visit: Payer: Self-pay | Admitting: *Deleted

## 2017-09-14 DIAGNOSIS — E538 Deficiency of other specified B group vitamins: Secondary | ICD-10-CM

## 2017-09-14 LAB — VITAMIN B12: Vitamin B-12: 184 pg/mL (ref 180–914)

## 2017-09-14 NOTE — Telephone Encounter (Signed)
-----   Message from Lequita Asal, MD sent at 09/14/2017  7:02 AM EDT ----- Regarding: Call patient  B12 low.  Begin B12 1000 mcg po q day or B12 injections.  If he starts oral B12, we will check a level in 1 month and if it has not improved, will begin B12 injections.  M  ----- Message ----- From: Interface, Lab In [place] Sent: 09/13/2017   1:23 PM EDT To: Lequita Asal, MD

## 2017-09-14 NOTE — Telephone Encounter (Signed)
Called patient and LVM to inform him that his B-12 is low.  MD recommends he start oral B-12 1000 mcg daily. We will recheck his level after a month and if not improved would recommend B-12 injections.

## 2017-10-13 ENCOUNTER — Inpatient Hospital Stay: Payer: Medicaid Other | Attending: Hematology and Oncology

## 2017-10-13 ENCOUNTER — Inpatient Hospital Stay: Payer: Medicaid Other

## 2017-10-13 DIAGNOSIS — D751 Secondary polycythemia: Secondary | ICD-10-CM | POA: Insufficient documentation

## 2017-10-13 LAB — HEMATOCRIT: HCT: 53.8 % — ABNORMAL HIGH (ref 39.0–52.0)

## 2017-10-13 LAB — HEMOGLOBIN: Hemoglobin: 17.3 g/dL — ABNORMAL HIGH (ref 13.0–17.0)

## 2017-10-13 NOTE — Progress Notes (Signed)
Phlebotomy performed due to HCT of 53.8 and Hgb of 17.3. 300 mls removed as ordered. No complaints from patient.

## 2017-11-17 ENCOUNTER — Inpatient Hospital Stay: Payer: Medicaid Other | Attending: Hematology and Oncology

## 2017-11-17 ENCOUNTER — Inpatient Hospital Stay: Payer: Medicaid Other

## 2017-12-15 ENCOUNTER — Inpatient Hospital Stay: Payer: Medicaid Other | Attending: Hematology and Oncology

## 2017-12-15 ENCOUNTER — Inpatient Hospital Stay: Payer: Medicaid Other

## 2018-01-03 ENCOUNTER — Encounter: Payer: Self-pay | Admitting: Family Medicine

## 2018-01-03 ENCOUNTER — Ambulatory Visit: Payer: Medicaid Other | Admitting: Family Medicine

## 2018-01-03 VITALS — BP 136/74 | HR 90 | Temp 98.2°F | Ht 70.0 in | Wt 208.4 lb

## 2018-01-03 DIAGNOSIS — K429 Umbilical hernia without obstruction or gangrene: Secondary | ICD-10-CM

## 2018-01-03 DIAGNOSIS — Z23 Encounter for immunization: Secondary | ICD-10-CM

## 2018-01-03 DIAGNOSIS — R350 Frequency of micturition: Secondary | ICD-10-CM

## 2018-01-03 DIAGNOSIS — R3915 Urgency of urination: Secondary | ICD-10-CM

## 2018-01-03 DIAGNOSIS — R739 Hyperglycemia, unspecified: Secondary | ICD-10-CM

## 2018-01-03 DIAGNOSIS — F3131 Bipolar disorder, current episode depressed, mild: Secondary | ICD-10-CM | POA: Diagnosis not present

## 2018-01-03 DIAGNOSIS — I10 Essential (primary) hypertension: Secondary | ICD-10-CM | POA: Diagnosis not present

## 2018-01-03 DIAGNOSIS — Z720 Tobacco use: Secondary | ICD-10-CM

## 2018-01-03 DIAGNOSIS — E538 Deficiency of other specified B group vitamins: Secondary | ICD-10-CM

## 2018-01-03 DIAGNOSIS — Z5181 Encounter for therapeutic drug level monitoring: Secondary | ICD-10-CM

## 2018-01-03 DIAGNOSIS — E782 Mixed hyperlipidemia: Secondary | ICD-10-CM

## 2018-01-03 DIAGNOSIS — D751 Secondary polycythemia: Secondary | ICD-10-CM

## 2018-01-03 DIAGNOSIS — E038 Other specified hypothyroidism: Secondary | ICD-10-CM

## 2018-01-03 DIAGNOSIS — M5136 Other intervertebral disc degeneration, lumbar region: Secondary | ICD-10-CM

## 2018-01-03 DIAGNOSIS — E559 Vitamin D deficiency, unspecified: Secondary | ICD-10-CM

## 2018-01-03 MED ORDER — CYANOCOBALAMIN 1000 MCG/ML IJ SOLN
1000.0000 ug | Freq: Once | INTRAMUSCULAR | Status: AC
  Administered 2018-01-03: 1000 ug via INTRAMUSCULAR

## 2018-01-03 MED ORDER — MAGNESIUM OXIDE 400 MG PO TABS: 400.0000 mg | ORAL_TABLET | Freq: Every day | ORAL | 0 refills | Status: DC

## 2018-01-03 NOTE — Patient Instructions (Addendum)
Check out the information at familydoctor.org entitled "Nutrition for Weight Loss: What You Need to Know about Fad Diets" Try to lose between 1-2 pounds per week by taking in fewer calories and burning off more calories You can succeed by limiting portions, limiting foods dense in calories and fat, becoming more active, and drinking 8 glasses of water a day (64 ounces) Don't skip meals, especially breakfast, as skipping meals may alter your metabolism Do not use over-the-counter weight loss pills or gimmicks that claim rapid weight loss A healthy BMI (or body mass index) is between 18.5 and 24.9 You can calculate your ideal BMI at the [place] website ClubMonetize.fr Try to follow the DASH guidelines (DASH stands for Dietary Approaches to Stop Hypertension). Try to limit the sodium in your diet to no more than 1,500mg  of sodium per day. Certainly try to not exceed 2,000 mg per day at the very most. Do not add salt when cooking or at the table.  Check the sodium amount on labels when shopping, and choose items lower in sodium when given a choice. Avoid or limit foods that already contain a lot of sodium. Eat a diet rich in fruits and vegetables and whole grains, and try to lose weight if overweight or obese Try to limit saturated fats in your diet (bologna, hot dogs, barbeque, cheeseburgers, hamburgers, steak, bacon, sausage, cheese, etc.) and get more fresh fruits, vegetables, and whole grains I do encourage you to quit smoking Call [phone] to sign up for smoking cessation classes You can call 1-800-QUIT-NOW to talk with a smoking cessation coach  Health Risks of Smoking Smoking cigarettes is very bad for your health. Tobacco smoke has over 200 known poisons in it. It contains the poisonous gases nitrogen oxide and carbon monoxide. There are over 60 chemicals in tobacco smoke that cause cancer. Smoking is difficult to quit because a chemical in  tobacco, called nicotine, causes addiction or dependence. When you smoke and inhale, nicotine is absorbed rapidly into the bloodstream through your lungs. Both inhaled and non-inhaled nicotine may be addictive. What are the risks of cigarette smoke? Cigarette smokers have an increased risk of many serious medical problems, including:  Lung cancer.  Lung disease, such as pneumonia, bronchitis, and emphysema.  Chest pain (angina) and heart attack because the heart is not getting enough oxygen.  Heart disease and peripheral blood vessel disease.  High blood pressure (hypertension).  Stroke.  Oral cancer, including cancer of the lip, mouth, or voice box.  Bladder cancer.  Pancreatic cancer.  Cervical cancer.  Pregnancy complications, including premature birth.  Stillbirths and smaller newborn babies, birth defects, and genetic damage to sperm.  Early menopause.  Lower estrogen level for women.  Infertility.  Facial wrinkles.  Blindness.  Increased risk of broken bones (fractures).  Senile dementia.  Stomach ulcers and internal bleeding.  Delayed wound healing and increased risk of complications during surgery.  Even smoking lightly shortens your life expectancy by several years. Because of secondhand smoke exposure, children of smokers have an increased risk of the following:  Sudden infant death syndrome (SIDS).  Respiratory infections.  Lung cancer.  Heart disease.  Ear infections. What are the benefits of quitting? There are many health benefits of quitting smoking. Here are some of them:  Within days of quitting smoking, your risk of having a heart attack decreases, your blood flow improves, and your lung capacity improves. Blood pressure, pulse rate, and breathing patterns start returning to normal soon after quitting.  Within  months, your lungs may clear up completely.  Quitting for 10 years reduces your risk of developing lung cancer and heart  disease to almost that of a nonsmoker.  People who quit may see an improvement in their overall quality of life. How do I quit smoking?     Smoking is an addiction with both physical and psychological effects, and longtime habits can be hard to change. Your health care provider can recommend:  Programs and community resources, which may include group support, education, or talk therapy.  Prescription medicines to help reduce cravings.  Nicotine replacement products, such as patches, gum, and nasal sprays. Use these products only as directed. Do not replace cigarette smoking with electronic cigarettes, which are commonly called e-cigarettes. The safety of e-cigarettes is not known, and some may contain harmful chemicals.  A combination of two or more of these methods. Where to find more information  American Lung Association: www.lung.org  American Cancer Society: www.cancer.org Summary  Smoking cigarettes is very bad for your health. Cigarette smokers have an increased risk of many serious medical problems, including several cancers, heart disease, and stroke.  Smoking is an addiction with both physical and psychological effects, and longtime habits can be hard to change.  By stopping right away, you can greatly reduce the risk of medical problems for you and your family.  To help you quit smoking, your health care provider can recommend programs, community resources, prescription medicines, and nicotine replacement products such as patches, gum, and nasal sprays. This information is not intended to replace advice given to you by your health care provider. Make sure you discuss any questions you have with your health care provider. Document Released: 01/30/2004 Document Revised: 03/25/2017 Document Reviewed: 12/27/2015 Elsevier Interactive Patient Education  2019 [employer].

## 2018-01-03 NOTE — Assessment & Plan Note (Signed)
Limit saturated fats 

## 2018-01-03 NOTE — Assessment & Plan Note (Signed)
Patient does not want xrays or anything done right now

## 2018-01-03 NOTE — Assessment & Plan Note (Signed)
Managed by hematologist; check H/H today

## 2018-01-03 NOTE — Assessment & Plan Note (Signed)
Recommended CT scan given smoking history

## 2018-01-03 NOTE — Assessment & Plan Note (Signed)
Well-controlled today; check Cr; try to follow DASH guideines

## 2018-01-03 NOTE — Assessment & Plan Note (Signed)
Check liver and kidneys 

## 2018-01-03 NOTE — Assessment & Plan Note (Signed)
Check A1c and glucose (little bit of sweet tea)

## 2018-01-03 NOTE — Progress Notes (Signed)
BP 136/74   Pulse 90   Temp 98.2 F (36.8 C) (Oral)   Ht 5\' 10"  (1.778 m)   Wt 208 lb 6.4 oz (94.5 kg)   SpO2 97%   BMI 29.90 kg/m    Subjective:    Patient ID: Tyler Cunas., male    DOB: [birth year], 57 y.o.   MRN: [id number]  HPI: Tyler Cobb. is a 57 y.o. male  Chief Complaint  Patient presents with  . Follow-up    HPI Patient is here for f/u  Last July, he started having to pee all the time; he has been getting up to urinate several times a night; when he goes, not much, like urgency; no blood in the urine; he gets episodes of blurred vision; no dry mouth; he has prediabetes; he told his doctor in Sept and a urine was done; reviewed  Polycythemia; he has been going to the hematologist; last labs reviewed; no s/s of stroke; he thinks about quitting all the time; it's his best friend, helps him get through the day; smoking for 43-44 years; probably a pack of day, up and down; 3 packs a day for a while when he got separated; now about 1/2 ppd  He was supposed to have hernia surgery and the anesthesiologist wouldn't do it because his blood was thick; now he is getting treatment for polycythemia, but no surgery; he has an umbilical hernia; not hard or purple, getting bigger and worse though; surgeon is Dr. Bary Castilla, saw him in August  Vitamin B12 deficiency; he used to take the pills; tired; does not want a sleep study  HTN; not checking BP away from the doctor very often; not adding much salt to his food  Hypothyroidism; energy level is not good; he has gained some weight; no constipation  Lab Results  Component Value Date   TSH 2.25 07/02/2017   High cholesterol; eating eggs and bacon and pizza  Bipolar d/o; no thoughts of self-harm or hurting others  Vitamin D deficiency  Headaches; taking advil; "I'm sick of headaches", maybe 5-7 years; has not seen neurologist  Depression screen [facility] 2/9 01/03/2018 07/02/2017 10/09/2016 04/03/2016 01/03/2016  Decreased  Interest 0 0 0 0 0  Down, Depressed, Hopeless 3 3 0 1 0  PHQ - 2 Score 3 3 0 1 0  Altered sleeping 1 0 - - -  Tired, decreased energy 3 3 - - -  Change in appetite 0 0 - - -  Feeling bad or failure about yourself  1 1 - - -  Trouble concentrating 1 0 - - -  Moving slowly or fidgety/restless 0 0 - - -  Suicidal thoughts 0 0 - - -  PHQ-9 Score 9 7 - - -  Difficult doing work/chores Somewhat difficult Somewhat difficult - - -   Fall Risk  01/03/2018 10/13/2017 07/02/2017 10/09/2016 04/03/2016  Falls in the past year? 0 No No No No  Number falls in past yr: 0 - - - -  Injury with Fall? 0 - - - -    Relevant past medical, surgical, family and social history reviewed Past Medical History:  Diagnosis Date  . AAT (alpha-1-antitrypsin) deficiency ([place])   . Anxiety   . Arthritis    SPINE  . Chronic low back pain   . COPD (chronic obstructive pulmonary disease) ([place])   . Depression   . Goiter   . Gout   . Hyperlipidemia   . Hypertension   .  Hypothyroidism   . Insomnia   . Panic attacks   . Thyroid cyst    x 2  . TMJ (temporomandibular joint disorder)   . Tobacco use   . Vitamin D deficiency disease    Past Surgical History:  Procedure Laterality Date  . COLONOSCOPY  2013   Dr. Orest Dikes 2   Family History  Problem Relation Age of Onset  . Heart disease Father   . Heart failure Father   . Heart attack Father        x 5  . Alcohol abuse Father   . COPD Father   . Dementia Mother   . Hypertension Mother   . Heart attack Brother   . CAD Brother   . Hypertension Brother   . Alcohol abuse Paternal Grandfather   . COPD Maternal Grandfather   . Cancer Neg Hx   . Diabetes Neg Hx   . Stroke Neg Hx    Social History   Tobacco Use  . Smoking status: Current Every Day Smoker    Packs/day: 1.00    Years: 40.00    Pack years: 40.00    Types: Cigarettes  . Smokeless tobacco: Never Used  Substance Use Topics  . Alcohol use: No  . Drug use: No     Office Visit from  01/03/2018 in [facility]  AUDIT-C Score  0      Interim medical history since last visit reviewed. Allergies and medications reviewed  Review of Systems Per HPI unless specifically indicated above     Objective:    BP 136/74   Pulse 90   Temp 98.2 F (36.8 C) (Oral)   Ht 5\' 10"  (1.778 m)   Wt 208 lb 6.4 oz (94.5 kg)   SpO2 97%   BMI 29.90 kg/m   Wt Readings from Last 3 Encounters:  01/03/18 208 lb 6.4 oz (94.5 kg)  09/13/17 206 lb 8 oz (93.7 kg)  08/05/17 205 lb (93 kg)    Physical Exam Constitutional:      General: He is not in acute distress.    Appearance: He is well-developed.  HENT:     Head: Normocephalic and atraumatic.  Eyes:     General: No scleral icterus. Neck:     Thyroid: No thyromegaly.  Cardiovascular:     Rate and Rhythm: Normal rate and regular rhythm.  Pulmonary:     Effort: Pulmonary effort is normal.     Breath sounds: Normal breath sounds.  Abdominal:     General: Bowel sounds are normal. There is no distension.     Palpations: Abdomen is soft.  Skin:    General: Skin is warm and dry.     Coloration: Skin is not pale.  Neurological:     Coordination: Coordination normal.  Psychiatric:        Behavior: Behavior normal.        Thought Content: Thought content normal.        Judgment: Judgment normal.     Results for orders placed or performed in visit on 10/13/17  Hemoglobin [facility])  Result Value Ref Range   Hemoglobin 17.3 (H) 13.0 - 17.0 g/dL  Hematocrit ([facility])  Result Value Ref Range   HCT 53.8 (H) 39.0 - 52.0 %      Assessment & Plan:   Problem List Items Addressed This Visit      Cardiovascular and Mediastinum   Hypertension (Chronic)    Well-controlled today; check Cr; try to  follow DASH guideines        Endocrine   Hypothyroidism (Chronic)   Relevant Orders   TSH     Musculoskeletal and Integument   DDD (degenerative disc disease), lumbar    Patient does not want xrays or anything done  right now        Other   Vitamin D deficiency disease    Check level and replace if needed      Relevant Orders   VITAMIN D 25 Hydroxy (Vit-D Deficiency, Fractures)   Umbilical hernia without obstruction and without gangrene (Chronic)    Refer back to surgeon      Relevant Orders   Ambulatory referral to General Surgery   Tobacco use (Chronic)    Recommended CT scan given smoking history      Relevant Orders   CT CHEST LUNG CA SCREEN LOW DOSE W/O CM   Secondary erythrocytosis (Chronic)    Managed by hematologist; check H/H today      Medication monitoring encounter    Check liver and kidneys      Relevant Orders   CBC with Differential/Platelet   COMPLETE METABOLIC PANEL WITH GFR   Low vitamin B12 level    Offered B12 injection, accepted, return monthly for 5 more treatments 1000 mcg B12 IM      Relevant Medications   cyanocobalamin ((VITAMIN B-12)) injection 1,000 mcg (Completed)   Hyperlipidemia (Chronic)    Limit saturated fats      Relevant Orders   Lipid panel   Hyperglycemia    Check A1c and glucose (little bit of sweet tea)      Relevant Orders   Hemoglobin A1c   Bipolar disorder (HCC) (Chronic)    Stable; no SI/HI       Other Visit Diagnoses    Need for influenza vaccination    -  Primary   Relevant Orders   Flu Vaccine QUAD 6+ mos PF IM (Fluarix Quad PF) (Completed)   Urinary urgency       Relevant Orders   Urinalysis w microscopic + reflex cultur   Urinary frequency       Relevant Orders   Urinalysis w microscopic + reflex cultur       Follow up plan: Return in about 1 month (around 02/03/2018) for B12 shot (monthly x 5 more months); Dr. Sanda Klein in 3 months.  An after-visit summary was printed and given to the patient at [place].  Please see the patient instructions which may contain other information and recommendations beyond what is mentioned above in the assessment and plan.  Meds ordered this encounter  Medications  .  cyanocobalamin ((VITAMIN B-12)) injection 1,000 mcg  . magnesium oxide (MAG-OX) 400 MG tablet    Sig: Take 1 tablet (400 mg total) by mouth daily.    Dispense:  30 tablet    Refill:  0    Orders Placed This Encounter  Procedures  . CT CHEST LUNG CA SCREEN LOW DOSE W/O CM  . Flu Vaccine QUAD 6+ mos PF IM (Fluarix Quad PF)  . CBC with Differential/Platelet  . COMPLETE METABOLIC PANEL WITH GFR  . Hemoglobin A1c  . Lipid panel  . TSH  . VITAMIN D 25 Hydroxy (Vit-D Deficiency, Fractures)  . Urinalysis w microscopic + reflex cultur  . Ambulatory referral to General Surgery

## 2018-01-03 NOTE — Assessment & Plan Note (Signed)
Stable; no SI/HI

## 2018-01-03 NOTE — Assessment & Plan Note (Signed)
Offered B12 injection, accepted, return monthly for 5 more treatments 1000 mcg B12 IM

## 2018-01-03 NOTE — Progress Notes (Signed)
Greetings. It was a pleasure to see you today. Your hemoglobin is high again, as is your hematocrit. Please get back in touch with your hematologist at the cancer center. The other labs are pending. Peace, Dr. Sanda Klein

## 2018-01-03 NOTE — Assessment & Plan Note (Signed)
Check level and replace if needed 

## 2018-01-03 NOTE — Assessment & Plan Note (Signed)
Refer back to surgeon

## 2018-01-04 ENCOUNTER — Other Ambulatory Visit: Payer: Self-pay | Admitting: Family Medicine

## 2018-01-04 LAB — CBC WITH DIFFERENTIAL/PLATELET
Absolute Monocytes: 677 cells/uL (ref 200–950)
BASOS PCT: 1.1 %
Basophils Absolute: 111 cells/uL (ref 0–200)
Eosinophils Absolute: 202 cells/uL (ref 15–500)
Eosinophils Relative: 2 %
HCT: 51.4 % — ABNORMAL HIGH (ref 38.5–50.0)
Hemoglobin: 17.4 g/dL — ABNORMAL HIGH (ref 13.2–17.1)
Lymphs Abs: 2020 cells/uL (ref 850–3900)
MCH: 28.4 pg (ref 27.0–33.0)
MCHC: 33.9 g/dL (ref 32.0–36.0)
MCV: 84 fL (ref 80.0–100.0)
MPV: 10.4 fL (ref 7.5–12.5)
Monocytes Relative: 6.7 %
Neutro Abs: 7090 cells/uL (ref 1500–7800)
Neutrophils Relative %: 70.2 %
PLATELETS: 414 10*3/uL — AB (ref 140–400)
RBC: 6.12 10*6/uL — AB (ref 4.20–5.80)
RDW: 13.5 % (ref 11.0–15.0)
TOTAL LYMPHOCYTE: 20 %
WBC: 10.1 10*3/uL (ref 3.8–10.8)

## 2018-01-04 LAB — COMPLETE METABOLIC PANEL WITH GFR
AG RATIO: 1.3 (calc) (ref 1.0–2.5)
ALT: 22 U/L (ref 9–46)
AST: 26 U/L (ref 10–35)
Albumin: 4.3 g/dL (ref 3.6–5.1)
Alkaline phosphatase (APISO): 100 U/L (ref 40–115)
BILIRUBIN TOTAL: 0.4 mg/dL (ref 0.2–1.2)
BUN: 12 mg/dL (ref 7–25)
CALCIUM: 10.1 mg/dL (ref 8.6–10.3)
CHLORIDE: 99 mmol/L (ref 98–110)
CO2: 30 mmol/L (ref 20–32)
Creat: 1.04 mg/dL (ref 0.70–1.33)
GFR, EST AFRICAN AMERICAN: 91 mL/min/{1.73_m2} (ref >=60)
GFR, Est Non African American: 78 mL/min/{1.73_m2} (ref >=60)
Globulin: 3.4 g/dL (calc) (ref 1.9–3.7)
Glucose, Bld: 93 mg/dL (ref 65–99)
POTASSIUM: 4.5 mmol/L (ref 3.5–5.3)
Sodium: 139 mmol/L (ref 135–146)
TOTAL PROTEIN: 7.7 g/dL (ref 6.1–8.1)

## 2018-01-04 LAB — TSH: TSH: 2.44 m[IU]/L (ref 0.40–4.50)

## 2018-01-04 LAB — LIPID PANEL
CHOLESTEROL: 145 mg/dL (ref <200)
HDL: 33 mg/dL — ABNORMAL LOW (ref >40)
LDL CHOLESTEROL (CALC): 88 mg/dL
Non-HDL Cholesterol (Calc): 112 mg/dL (calc) (ref <130)
Total CHOL/HDL Ratio: 4.4 (calc) (ref <5.0)
Triglycerides: 147 mg/dL (ref <150)

## 2018-01-04 LAB — HEMOGLOBIN A1C
EAG (MMOL/L): 6.8 (calc)
Hgb A1c MFr Bld: 5.9 % of total Hgb — ABNORMAL HIGH (ref <5.7)
MEAN PLASMA GLUCOSE: 123 (calc)

## 2018-01-04 LAB — URINALYSIS W MICROSCOPIC + REFLEX CULTURE
Bacteria, UA: NONE SEEN /HPF
Bilirubin Urine: NEGATIVE
Glucose, UA: NEGATIVE
HGB URINE DIPSTICK: NEGATIVE
Hyaline Cast: NONE SEEN /LPF
KETONES UR: NEGATIVE
LEUKOCYTE ESTERASE: NEGATIVE
Nitrites, Initial: NEGATIVE
Protein, ur: NEGATIVE
RBC / HPF: NONE SEEN /HPF (ref 0–2)
SPECIFIC GRAVITY, URINE: 1.009 (ref 1.001–1.03)
Squamous Epithelial / HPF: NONE SEEN /HPF (ref <=5)
WBC, UA: NONE SEEN /HPF (ref 0–5)
pH: 7.5 (ref 5.0–8.0)

## 2018-01-04 LAB — NO CULTURE INDICATED

## 2018-01-04 LAB — VITAMIN D 25 HYDROXY (VIT D DEFICIENCY, FRACTURES): VIT D 25 HYDROXY: 28 ng/mL — AB (ref 30–100)

## 2018-01-04 NOTE — Telephone Encounter (Signed)
Cr and K+ reviewed Rx approved

## 2018-01-07 ENCOUNTER — Telehealth: Payer: Self-pay | Admitting: *Deleted

## 2018-01-07 DIAGNOSIS — Z122 Encounter for screening for malignant neoplasm of respiratory organs: Secondary | ICD-10-CM

## 2018-01-07 DIAGNOSIS — Z87891 Personal history of nicotine dependence: Secondary | ICD-10-CM

## 2018-01-07 NOTE — Telephone Encounter (Signed)
Received referral for initial lung cancer screening scan. Contacted patient and obtained smoking history,(current, 40 pack year) as well as answering questions related to screening process. Patient denies signs of lung cancer such as weight loss or hemoptysis. Patient denies comorbidity that would prevent curative treatment if lung cancer were found. Patient is scheduled for shared decision making visit and CT scan on 01/11/18 at 115pm.

## 2018-01-10 ENCOUNTER — Telehealth: Payer: Self-pay | Admitting: *Deleted

## 2018-01-10 ENCOUNTER — Encounter: Payer: Self-pay | Admitting: *Deleted

## 2018-01-10 NOTE — Telephone Encounter (Signed)
Called pt to remind him of his appt for ldct screening tomorrow.  Patient reports that he needs to cancel appt and he will call back to reschedule.  When offered to recall him at a later date, he refused.

## 2018-01-11 ENCOUNTER — Ambulatory Visit: Payer: Medicaid Other | Admitting: Oncology

## 2018-01-11 ENCOUNTER — Ambulatory Visit: Payer: Medicaid Other

## 2018-01-12 ENCOUNTER — Inpatient Hospital Stay: Payer: Medicaid Other | Attending: Hematology and Oncology

## 2018-01-12 ENCOUNTER — Inpatient Hospital Stay: Payer: Medicaid Other

## 2018-01-30 ENCOUNTER — Other Ambulatory Visit: Payer: Self-pay | Admitting: Family Medicine

## 2018-01-31 NOTE — Telephone Encounter (Signed)
Lab Results  Component Value Date   CHOL 145 01/03/2018   HDL 33 (L) 01/03/2018   LDLCALC 88 01/03/2018   TRIG 147 01/03/2018   CHOLHDL 4.4 01/03/2018   Lab Results  Component Value Date   ALT 22 01/03/2018

## 2018-02-03 ENCOUNTER — Ambulatory Visit: Payer: Medicaid Other

## 2018-02-07 ENCOUNTER — Ambulatory Visit: Payer: Medicaid Other

## 2018-02-07 DIAGNOSIS — E538 Deficiency of other specified B group vitamins: Secondary | ICD-10-CM

## 2018-02-07 MED ORDER — CYANOCOBALAMIN 1000 MCG/ML IJ SOLN
1000.0000 ug | Freq: Once | INTRAMUSCULAR | Status: AC
  Administered 2018-02-07: 1000 ug via INTRAMUSCULAR

## 2018-02-16 ENCOUNTER — Inpatient Hospital Stay: Payer: Medicaid Other | Attending: Hematology and Oncology

## 2018-02-16 ENCOUNTER — Inpatient Hospital Stay: Payer: Medicaid Other

## 2018-02-16 VITALS — BP 125/76 | HR 65 | Resp 18

## 2018-02-16 DIAGNOSIS — D751 Secondary polycythemia: Secondary | ICD-10-CM

## 2018-02-16 LAB — HEMOGLOBIN: Hemoglobin: 16.9 g/dL (ref 13.0–17.0)

## 2018-02-16 LAB — HEMATOCRIT: HCT: 51.7 % (ref 39.0–52.0)

## 2018-03-14 ENCOUNTER — Other Ambulatory Visit: Payer: Medicaid Other

## 2018-03-14 ENCOUNTER — Inpatient Hospital Stay: Payer: Medicaid Other | Admitting: Hematology and Oncology

## 2018-03-14 ENCOUNTER — Inpatient Hospital Stay: Payer: Medicaid Other | Attending: Hematology and Oncology

## 2018-03-14 ENCOUNTER — Ambulatory Visit: Payer: Medicaid Other | Admitting: Hematology and Oncology

## 2018-03-14 ENCOUNTER — Other Ambulatory Visit: Payer: Self-pay | Admitting: Hematology and Oncology

## 2018-03-14 ENCOUNTER — Inpatient Hospital Stay: Payer: Medicaid Other

## 2018-03-14 DIAGNOSIS — E538 Deficiency of other specified B group vitamins: Secondary | ICD-10-CM

## 2018-03-14 NOTE — Progress Notes (Deleted)
[facility] Clinic day:  03/14/2018  Chief Complaint: Tyler Cobb. is a 58 y.o. male with secondary erythrocytosis and B12 deficiency who is seen for 6 month assessment.  HPI:  The patient last seen in the hematology clinic on 09/13/2017.  At that time, he was seen for initial evaluation.  He was being scheduled for an umbilical hernia.  Symptomatically, he felt "ok" some days.  He was fatigued a lot.  He noted urinary frequency.  Exam revealed no adenopathy or hepatosplenomegaly.  He had a reducible hernia.  B12 level was 184.  He was contacted regarding his low B12 with plan for initiation of oral B12 and recheck in 1 month.  Hematocrit/hemoglobin has been followed: 53.8/17/3 on 10/13/2017, 51.4/17.4 on 01/03/2018, and 51.7/16.9 on 02/16/2018. He underwent phlebotomy on 10/13/2017 and 02/16/2018.  Patient was referred to the low dose chest CT program.  He was scheduled for imaging on 01/11/2018, but cancelled.  During the interim,   Past Medical History:  Diagnosis Date  . AAT (alpha-1-antitrypsin) deficiency ([place])   . Anxiety   . Arthritis    SPINE  . Chronic low back pain   . COPD (chronic obstructive pulmonary disease) ([place])   . Depression   . Goiter   . Gout   . Hyperlipidemia   . Hypertension   . Hypothyroidism   . Insomnia   . Panic attacks   . Thyroid cyst    x 2  . TMJ (temporomandibular joint disorder)   . Tobacco use   . Vitamin D deficiency disease     Past Surgical History:  Procedure Laterality Date  . COLONOSCOPY  2013   Dr. Orest Dikes 2    Family History  Problem Relation Age of Onset  . Heart disease Father   . Heart failure Father   . Heart attack Father        x 5  . Alcohol abuse Father   . COPD Father   . Dementia Mother   . Hypertension Mother   . Heart attack Brother   . CAD Brother   . Hypertension Brother   . Alcohol abuse Paternal Grandfather   . COPD Maternal Grandfather   . Cancer Neg  Hx   . Diabetes Neg Hx   . Stroke Neg Hx     Social History:  reports that he has been smoking cigarettes. He has a 40.00 pack-year smoking history. He has never used smokeless tobacco. He reports that he does not drink alcohol or use drugs.  He has smoked 1 pack/day x 43 years.  Contact number [phone]. He is on disability (mental and chronic back pain).  The patient is alone today.  Allergies:  Allergies  Allergen Reactions  . No Known Allergies     Current Medications: Current Outpatient Medications  Medication Sig Dispense Refill  . ibuprofen (ADVIL,MOTRIN) 200 MG tablet Take 200 mg by mouth 2 (two) times daily.    Marland Kitchen levothyroxine (SYNTHROID, LEVOTHROID) 100 MCG tablet TAKE 1 TABLET BY MOUTH EVERY DAY 30 tablet 11  . lisinopril-hydrochlorothiazide (PRINZIDE,ZESTORETIC) 20-12.5 MG tablet TAKE 1 AND 1/2 TABLETS DAILY BY MOUTH 45 tablet 5  . magnesium oxide (MAG-OX) 400 MG tablet Take 1 tablet (400 mg total) by mouth daily. 30 tablet 0  . PARoxetine (PAXIL) 20 MG tablet TAKE 1 TABLET (20 MG TOTAL) BY MOUTH 2 (TWO) TIMES DAILY. 60 tablet 11  . simvastatin (ZOCOR) 40 MG tablet TAKE 1 TABLET BY  MOUTH EVERYDAY AT BEDTIME 30 tablet 5   No current facility-administered medications for this visit.     Review of Systems:  GENERAL:  Feels "ok" some days.  Tired a lot.  No fevers, sweats or weight loss. PERFORMANCE STATUS (ECOG):  2 HEENT:  No visual changes, runny nose, sore throat, mouth sores or tenderness. Lungs: No shortness of breath or cough.  No hemoptysis.  Denies sleep apnea. Cardiac:  No chest pain, palpitations, orthopnea, or PND. GI:  No nausea, vomiting, diarrhea, constipation, melena or hematochezia.  Internal hemorrhoids.  Colonoscopy x 2 (2010 and 2013; Dr Vira Agar). GU:  No urgency, frequency, dysuria, or hematuria.  Voids at night.   Musculoskeletal:  Back pain.  No joint pain.  No muscle tenderness. Extremities:  No pain or swelling. Skin:  No rashes or skin  changes. Neuro:  Headache. No numbness or weakness, balance or coordination issues. Endocrine:  No diabetes.  Hypothyroid on Synthroid.  No hot flashes or night sweats. Psych:  No mood changes, depression or anxiety. Pain:  No focal pain. Review of systems:  All other systems reviewed and found to be negative.  Physical Exam: There were no vitals taken for this visit. GENERAL:  Well developed, well nourished, gentleman sitting comfortably in the exam room in no acute distress. MENTAL STATUS:  Alert and oriented to person, place and time. HEAD:  Wearing a cap.  Brown hair.  Mustache. Normocephalic, atraumatic, face symmetric, no Cushingoid features. EYES:  Brown eyes.  Pupils equal round and reactive to light and accomodation.  No conjunctivitis or scleral icterus. ENT:  Oropharynx clear without lesion.  Tongue normal. Mucous membranes moist.  RESPIRATORY:  Clear to auscultation without rales, wheezes or rhonchi. CARDIOVASCULAR:  Regular rate and rhythm without murmur, rub or gallop. ABDOMEN:  Reducible umbilical hernia.  Soft, non-tender, with active bowel sounds, and no hepatosplenomegaly.  No masses. SKIN:  No rashes, ulcers or lesions. EXTREMITIES: No edema, no skin discoloration or tenderness.  No palpable cords. LYMPH NODES: No palpable cervical, supraclavicular, axillary or inguinal adenopathy  NEUROLOGICAL: Unremarkable. PSYCH:  Appropriate.   No visits with results within 3 Day(s) from this visit.  Latest known visit with results is:  Appointment on 02/16/2018  Component Date Value Ref Range Status  . Hemoglobin 02/16/2018 16.9  13.0 - 17.0 g/dL Final   Performed at [facility], [address] [place], [place] [address]  . HCT 02/16/2018 51.7  39.0 - 52.0 % Final   Performed at [facility], [address] [place], [place] [address]    Assessment:  Tyler Belcastro. is a 58 y.o. 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  He denies sleep  apnea.  He does not take testosterone.  Work-up in 11/2013 was negative:  JAK2, BCR-ABL, and flow cytometry.  He undergoes phlebotomy if his hematocrit is > 48 (last 08/13/2017).  He has a low normal B12.  B12 was 326 on 07/02/2017.  TSH was normal on 07/02/2017.  He has an umbilical hernia that requires correction.  Symptomatically,    Plan: 1.  Labs today:  CBC with diff, ferritin, B12, folate, anti-parietal antibodies, intrinsic factor antibodies.   2.  Secondary erythrocytosis:  Goal hemoglobin < 16 for upcoming surgery.  Patient to follow-up with Dr. Bary Castilla.  Phlebotomy program continues. 3.  B12 deficiency:  Encourage patient to take oral B12.  Check level  today.  Check folate.  Follow-up B12 level in 1 month after oral B12  restarted. 4.  Smoking:  Discuss smoking cessation.  Discuss low dose chest CT program - declines. 5.  Health care maintenance:  Discuss colonoscopy (2010 and 2013) with Dr. Vira Agar. 6.  RTC in 6 months for MD assessment and labs (CBC with diff, ferritin) +/- phlebotomy.   Lequita Asal, MD  03/14/2018, 3:55 AM   I saw and evaluated the patient, participating in the key portions of the service and reviewing pertinent diagnostic studies and records.  I reviewed the nurse practitioner's note and agree with the findings and the plan.  The assessment and plan were discussed with the patient.  Additional diagnostic studies of *** are needed to clarify *** and would change the clinical management.  A few ***multiple questions were asked by the patient and answered.   Nolon Stalls, MD 03/14/2018,3:55 AM

## 2018-03-21 ENCOUNTER — Other Ambulatory Visit: Payer: Self-pay | Admitting: Family Medicine

## 2018-03-31 NOTE — Progress Notes (Unsigned)
Virtual Visit via Telephone Note  I connected with Tyler Ar Sr. on 03/31/18 at  1:00 PM EDT by telephone and verified that I am speaking with the correct person using two identifiers. Staff  discussed the limitations, risks, security and privacy concerns of performing an evaluation and management service by telephone and the availability of in person appointments.The patient expressed understanding and agreed to proceed.   History of Present Illness: Hypertension rx lisinopril 20mg  and HCTZ 12.mg daily BP Readings from Last 3 Encounters:  02/16/18 125/76  01/03/18 136/74  09/13/17 (!) 146/76    Hyperlipidemia rx simvastatin 40mg  daily Lab Results  Component Value Date   CHOL 145 01/03/2018   HDL 33 (L) 01/03/2018   LDLCALC 88 01/03/2018   TRIG 147 01/03/2018   CHOLHDL 4.4 01/03/2018    Hypothyroidism rx synthroid 133mcg daily Lab Results  Component Value Date   TSH 2.44 01/03/2018    Bipolar disorder paxil 20mg  daily   Observations/Objective:   Assessment and Plan:   Follow Up Instructions:    I discussed the assessment and treatment plan with the patient. The patient was provided an opportunity to ask questions and all were answered. The patient agreed with the plan and demonstrated an understanding of the instructions.   The patient was advised to call back or seek an in-person evaluation if the symptoms worsen or if the condition fails to improve as anticipated.  I provided *** minutes of non-face-to-face time during this encounter.   Fredderick Severance, NP

## 2018-04-01 ENCOUNTER — Ambulatory Visit: Payer: Medicaid Other | Admitting: Nurse Practitioner

## 2018-04-04 ENCOUNTER — Ambulatory Visit: Payer: Medicaid Other | Admitting: Family Medicine

## 2018-04-14 ENCOUNTER — Inpatient Hospital Stay: Payer: Medicaid Other | Attending: Hematology and Oncology

## 2018-04-14 ENCOUNTER — Inpatient Hospital Stay: Payer: Medicaid Other | Admitting: Hematology and Oncology

## 2018-04-14 ENCOUNTER — Inpatient Hospital Stay: Payer: Medicaid Other

## 2018-04-14 NOTE — Progress Notes (Deleted)
[facility]     [address]     [place], [place] [address]     Phone: [phone]      Fax: [phone]        Clinic day:  04/14/2018  Chief Complaint: Tyler Hosea. is a 58 y.o. male with erythrocytosis who is seen for 6 month assessment.  HPI:  The patient was last seen in the hematology clinic on 09/13/2017.  At that time, he felt "ok" some days.  He was fatigued a lot.  He noted urinary frequency.  Exam revealed no adenopathy or hepatosplenomegaly.  He had a reducible hernia.  B12 level was 184.  We discussed initiation of oral B12.  We discussed plans for a hemoglobin < 16 for his upcoming hernia surgery.  He underwent phlebotomy on 10/13/2017 and 02/16/2018.  Hematocrit/hemoglobin was 53.8/17.3 and 51.7/16.9, respectively.  Symptomatically,    Past Medical History:  Diagnosis Date  . AAT (alpha-1-antitrypsin) deficiency ([place])   . Anxiety   . Arthritis    SPINE  . Chronic low back pain   . COPD (chronic obstructive pulmonary disease) ([place])   . Depression   . Goiter   . Gout   . Hyperlipidemia   . Hypertension   . Hypothyroidism   . Insomnia   . Panic attacks   . Thyroid cyst    x 2  . TMJ (temporomandibular joint disorder)   . Tobacco use   . Vitamin D deficiency disease     Past Surgical History:  Procedure Laterality Date  . COLONOSCOPY  2013   Dr. Orest Dikes 2    Family History  Problem Relation Age of Onset  . Heart disease Father   . Heart failure Father   . Heart attack Father        x 5  . Alcohol abuse Father   . COPD Father   . Dementia Mother   . Hypertension Mother   . Heart attack Brother   . CAD Brother   . Hypertension Brother   . Alcohol abuse Paternal Grandfather   . COPD Maternal Grandfather   . Cancer Neg Hx   . Diabetes Neg Hx   . Stroke Neg Hx     Social History:  reports that he has been smoking cigarettes. He has a 40.00 pack-year smoking history. He has never used smokeless  tobacco. He reports that he does not drink alcohol or use drugs.  He has smoked 1 pack/day x 43 years.  Contact number [phone]. He is on disability (mental and chronic back pain).  The patient is alone today.  Allergies:  Allergies  Allergen Reactions  . No Known Allergies     Current Medications: Current Outpatient Medications  Medication Sig Dispense Refill  . ibuprofen (ADVIL,MOTRIN) 200 MG tablet Take 200 mg by mouth 2 (two) times daily.    Marland Kitchen levothyroxine (SYNTHROID, LEVOTHROID) 100 MCG tablet TAKE 1 TABLET BY MOUTH EVERY DAY 30 tablet 11  . lisinopril-hydrochlorothiazide (PRINZIDE,ZESTORETIC) 20-12.5 MG tablet TAKE 1 AND 1/2 TABLETS DAILY BY MOUTH 45 tablet 5  . magnesium oxide (MAG-OX) 400 MG tablet Take 1 tablet (400 mg total) by mouth daily. 30 tablet 0  . PARoxetine (PAXIL) 20 MG tablet TAKE 1 TABLET BY MOUTH TWICE A DAY 60 tablet 5  . simvastatin (ZOCOR) 40 MG tablet TAKE 1 TABLET BY MOUTH EVERYDAY AT BEDTIME 30 tablet 5   No current facility-administered medications for this visit.  Review of Systems:  GENERAL:  Feels "ok" some days.  Tired a lot.  No fevers, sweats or weight loss. PERFORMANCE STATUS (ECOG):  2 HEENT:  No visual changes, runny nose, sore throat, mouth sores or tenderness. Lungs: No shortness of breath or cough.  No hemoptysis.  Denies sleep apnea. Cardiac:  No chest pain, palpitations, orthopnea, or PND. GI:  No nausea, vomiting, diarrhea, constipation, melena or hematochezia.  Internal hemorrhoids.  Colonoscopy x 2 (2010 and 2013; Dr Vira Agar). GU:  No urgency, frequency, dysuria, or hematuria.  Voids at night.   Musculoskeletal:  Back pain.  No joint pain.  No muscle tenderness. Extremities:  No pain or swelling. Skin:  No rashes or skin changes. Neuro:  Headache. No numbness or weakness, balance or coordination issues. Endocrine:  No diabetes.  Hypothyroid on Synthroid.  No hot flashes or night sweats. Psych:  No mood changes, depression or  anxiety. Pain:  No focal pain. Review of systems:  All other systems reviewed and found to be negative.  Physical Exam: There were no vitals taken for this visit. GENERAL:  Well developed, well nourished, gentleman sitting comfortably in the exam room in no acute distress. MENTAL STATUS:  Alert and oriented to person, place and time. HEAD:  Wearing a cap.  Brown hair.  Mustache. Normocephalic, atraumatic, face symmetric, no Cushingoid features. EYES:  Brown eyes.  Pupils equal round and reactive to light and accomodation.  No conjunctivitis or scleral icterus. ENT:  Oropharynx clear without lesion.  Tongue normal. Mucous membranes moist.  RESPIRATORY:  Clear to auscultation without rales, wheezes or rhonchi. CARDIOVASCULAR:  Regular rate and rhythm without murmur, rub or gallop. ABDOMEN:  Reducible umbilical hernia.  Soft, non-tender, with active bowel sounds, and no hepatosplenomegaly.  No masses. SKIN:  No rashes, ulcers or lesions. EXTREMITIES: No edema, no skin discoloration or tenderness.  No palpable cords. LYMPH NODES: No palpable cervical, supraclavicular, axillary or inguinal adenopathy  NEUROLOGICAL: Unremarkable. PSYCH:  Appropriate.   No visits with results within 3 Day(s) from this visit.  Latest known visit with results is:  Appointment on 02/16/2018  Component Date Value Ref Range Status  . Hemoglobin 02/16/2018 16.9  13.0 - 17.0 g/dL Final   Performed at [facility], [address] [place], [place] [address]  . HCT 02/16/2018 51.7  39.0 - 52.0 % Final   Performed at [facility], [address] [place], [place] [address]    Assessment:  Tyler Splawn. is a 58 y.o. 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  He denies sleep apnea.  He does not take testosterone.  Work-up in 11/2013 was negative:  JAK2, BCR-ABL, and flow cytometry.  He undergoes phlebotomy if his hematocrit is > 48 (last 02/16/2018).  He has a low normal B12.   B12 was 326 on 07/02/2017.  TSH was normal on 07/02/2017.  He has an umbilical hernia that requires correction.  Symptomatically, he feels "ok" some days.  He is fatigued a lot.  He notes urinary frequency.  Exam reveals no adenopathy or hepatosplenomegaly.  He has a reducible hernia.  B12 level is 184.  Plan: 1.  Labs today:  CBC with diff, ferritin, B12, folate, intrinsic factor antibodies, anti-parietal antibodies.   2.  Secondary erythrocytosis  Goal hemoglobin < 16 for upcoming surgery.  Patient to follow-up with Dr. Bary Castilla.  Phlebotomy program continues. 3.  B12 deficiency  Encourage patient to take oral B12.  Check level  today.  Check folate.  Follow-up B12 level in 1 month after oral B12 restarted. 4.  Smoking  Discuss smoking cessation.  Discuss low dose chest CT program - declines. 5.  Health care maintenance  Discuss colonoscopy (2010 and 2013) with Dr. Vira Agar. 6.  RTC    Lequita Asal, MD  04/14/2018, 4:56 AM

## 2018-07-01 ENCOUNTER — Telehealth: Payer: Self-pay | Admitting: Family Medicine

## 2018-07-03 NOTE — Telephone Encounter (Signed)
Patient needs appointment within month

## 2018-07-06 NOTE — Telephone Encounter (Signed)
Pt is scheduled °

## 2018-08-02 ENCOUNTER — Other Ambulatory Visit: Payer: Self-pay | Admitting: Nurse Practitioner

## 2018-08-02 ENCOUNTER — Other Ambulatory Visit: Payer: Self-pay | Admitting: Family Medicine

## 2018-08-15 ENCOUNTER — Other Ambulatory Visit: Payer: Self-pay

## 2018-08-15 ENCOUNTER — Ambulatory Visit: Payer: Medicaid Other | Admitting: Nurse Practitioner

## 2018-08-15 ENCOUNTER — Encounter: Payer: Self-pay | Admitting: Nurse Practitioner

## 2018-08-15 VITALS — BP 132/74 | HR 71 | Temp 97.3°F | Resp 14 | Ht 70.0 in | Wt 203.6 lb

## 2018-08-15 DIAGNOSIS — E782 Mixed hyperlipidemia: Secondary | ICD-10-CM

## 2018-08-15 DIAGNOSIS — B070 Plantar wart: Secondary | ICD-10-CM | POA: Diagnosis not present

## 2018-08-15 DIAGNOSIS — E038 Other specified hypothyroidism: Secondary | ICD-10-CM

## 2018-08-15 DIAGNOSIS — I77811 Abdominal aortic ectasia: Secondary | ICD-10-CM | POA: Diagnosis not present

## 2018-08-15 DIAGNOSIS — K429 Umbilical hernia without obstruction or gangrene: Secondary | ICD-10-CM

## 2018-08-15 DIAGNOSIS — Z716 Tobacco abuse counseling: Secondary | ICD-10-CM

## 2018-08-15 DIAGNOSIS — Z5181 Encounter for therapeutic drug level monitoring: Secondary | ICD-10-CM

## 2018-08-15 DIAGNOSIS — F3131 Bipolar disorder, current episode depressed, mild: Secondary | ICD-10-CM

## 2018-08-15 DIAGNOSIS — I10 Essential (primary) hypertension: Secondary | ICD-10-CM

## 2018-08-15 LAB — COMPLETE METABOLIC PANEL WITH GFR
AG Ratio: 1.4 (calc) (ref 1.0–2.5)
ALT: 16 U/L (ref 9–46)
AST: 22 U/L (ref 10–35)
Albumin: 4.4 g/dL (ref 3.6–5.1)
Alkaline phosphatase (APISO): 101 U/L (ref 35–144)
BUN: 16 mg/dL (ref 7–25)
CO2: 28 mmol/L (ref 20–32)
Calcium: 9.9 mg/dL (ref 8.6–10.3)
Chloride: 102 mmol/L (ref 98–110)
Creat: 0.97 mg/dL (ref 0.70–1.25)
GFR, Est African American: 98 mL/min/{1.73_m2} (ref >=60)
GFR, Est Non African American: 84 mL/min/{1.73_m2} (ref >=60)
Globulin: 3.2 g/dL (calc) (ref 1.9–3.7)
Glucose, Bld: 84 mg/dL (ref 65–99)
Potassium: 4.2 mmol/L (ref 3.5–5.3)
Sodium: 138 mmol/L (ref 135–146)
Total Bilirubin: 0.6 mg/dL (ref 0.2–1.2)
Total Protein: 7.6 g/dL (ref 6.1–8.1)

## 2018-08-15 LAB — LIPID PANEL
Cholesterol: 142 mg/dL (ref <200)
HDL: 29 mg/dL — ABNORMAL LOW (ref >=40)
LDL Cholesterol (Calc): 92 mg/dL (calc)
Non-HDL Cholesterol (Calc): 113 mg/dL (calc) (ref <130)
Total CHOL/HDL Ratio: 4.9 (calc) (ref <5.0)
Triglycerides: 110 mg/dL (ref <150)

## 2018-08-15 LAB — TSH: TSH: 3.09 mIU/L (ref 0.40–4.50)

## 2018-08-15 MED ORDER — NICOTINE 14 MG/24HR TD PT24: 14.0000 mg | MEDICATED_PATCH | Freq: Every day | TRANSDERMAL | 0 refills | Status: DC

## 2018-08-15 MED ORDER — LEVOTHYROXINE SODIUM 100 MCG PO TABS: 100.0000 ug | ORAL_TABLET | Freq: Every day | ORAL | 3 refills | Status: DC

## 2018-08-15 MED ORDER — SIMVASTATIN 40 MG PO TABS: 40.0000 mg | ORAL_TABLET | Freq: Every day | ORAL | 3 refills | Status: DC

## 2018-08-15 MED ORDER — PAROXETINE HCL 20 MG PO TABS: 20.0000 mg | ORAL_TABLET | Freq: Every day | ORAL | 3 refills | Status: DC

## 2018-08-15 MED ORDER — LISINOPRIL-HYDROCHLOROTHIAZIDE 20-12.5 MG PO TABS: 1.0000 | ORAL_TABLET | Freq: Every day | ORAL | 3 refills | Status: DC

## 2018-08-15 NOTE — Patient Instructions (Addendum)
Good cholesterol, also called high-density lipoprotein (HDL) removes extra cholesterol and plaque buildup in your arteries and then sends it to your liver to get rid of and helps reduce your risk of heart disease, heart attack, and stroke.Foods that increase HDL: beans and legumes, whole grains, high-fiber fruits:prunes, apples, and pears; fatty fish- salmon, tuna, sardines; nuts, olive oil   - We recommend CT scan of lungs for lung cancer screening; has ectatic abdominal aorta- recommend ultrasound this year. Please let us know if you change your mind on screening.   - Nicotine patches may help you stop smoking if you are smoking 1ppd work on cutting down slowly over the next few weeks and you can add on nicotine gum or patches  Steps to Quit Smoking Smoking tobacco is the leading cause of preventable death. It can affect almost every organ in the body. Smoking puts you and those around you at risk for developing many serious chronic diseases. Quitting smoking can be difficult, but it is one of the best things that you can do for your health. It is never too late to quit. How do I get ready to quit? When you decide to quit smoking, create a plan to help you succeed. Before you quit:  Pick a date to quit. Set a date within the next 2 weeks to give you time to prepare.  Write down the reasons why you are quitting. Keep this list in places where you will see it often.  Tell your family, friends, and co-workers that you are quitting. Support from your loved ones can make quitting easier.  Talk with your health care provider about your options for quitting smoking.  Find out what treatment options are covered by your health insurance.  Identify people, places, things, and activities that make you want to smoke (triggers). Avoid them. What first steps can I take to quit smoking?  Throw away all cigarettes at home, at work, and in your car.  Throw away smoking accessories, such as Scientist, physiological.  Clean your car. Make sure to empty the ashtray.  Clean your home, including curtains and carpets. What strategies can I use to quit smoking? Talk with your health care provider about combining strategies, such as taking medicines while you are also receiving in-person counseling. Using these two strategies together makes you more likely to succeed in quitting than if you used either strategy on its own.  If you are pregnant or breastfeeding, talk with your health care provider about finding counseling or other support strategies to quit smoking. Do not take medicine to help you quit smoking unless your health care provider tells you to do so. To quit smoking: Quit right away  Quit smoking completely, instead of gradually reducing how much you smoke over a period of time. Research shows that stopping smoking right away is more successful than gradually quitting.  Attend in-person counseling to help you build problem-solving skills. You are more likely to succeed in quitting if you attend counseling sessions regularly. Even short sessions of 10 minutes can be effective. Take medicine You may take medicines to help you quit smoking. Some medicines require a prescription and some you can purchase over-the-counter. Medicines may have nicotine in them to replace the nicotine in cigarettes. Medicines may:  Help to stop cravings.  Help to relieve withdrawal symptoms. Your health care provider may recommend:  Nicotine patches, gum, or lozenges.  Nicotine inhalers or sprays.  Non-nicotine medicine that is taken by mouth. Find  resources Find resources and support systems that can help you to quit smoking and remain smoke-free after you quit. These resources are most helpful when you use them often. They include:  Online chats with a Social worker.  Telephone quitlines.  Printed Furniture conservator/restorer.  Support groups or group counseling.  Text messaging programs.  Mobile phone apps  or applications. Use apps that can help you stick to your quit plan by providing reminders, tips, and encouragement. There are many free apps for mobile devices as well as websites. Examples include Quit Guide from the [employer] and smokefree.gov What things can I do to make it easier to quit?   Reach out to your family and friends for support and encouragement. Call telephone quitlines (1-800-QUIT-NOW), reach out to support groups, or work with a counselor for support.  Ask people who smoke to avoid smoking around you.  Avoid places that trigger you to smoke, such as bars, parties, or smoke-break areas at work.  Spend time with people who do not smoke.  Lessen the stress in your life. Stress can be a smoking trigger for some people. To lessen stress, try: ? Exercising regularly. ? Doing deep-breathing exercises. ? Doing yoga. ? Meditating. ? Performing a body scan. This involves closing your eyes, scanning your body from head to toe, and noticing which parts of your body are particularly tense. Try to relax the muscles in those areas. How will I feel when I quit smoking? Day 1 to 3 weeks Within the first 24 hours of quitting smoking, you may start to feel withdrawal symptoms. These symptoms are usually most noticeable 2-3 days after quitting, but they usually do not last for more than 2-3 weeks. You may experience these symptoms:  Mood swings.  Restlessness, anxiety, or irritability.  Trouble concentrating.  Dizziness.  Strong cravings for sugary foods and nicotine.  Mild weight gain.  Constipation.  Nausea.  Coughing or a sore throat.  Changes in how the medicines that you take for unrelated issues work in your body.  Depression.  Trouble sleeping (insomnia). Week 3 and afterward After the first 2-3 weeks of quitting, you may start to notice more positive results, such as:  Improved sense of smell and taste.  Decreased coughing and sore throat.  Slower heart  rate.  Lower blood pressure.  Clearer skin.  The ability to breathe more easily.  Fewer sick days. Quitting smoking can be very challenging. Do not get discouraged if you are not successful the first time. Some people need to make many attempts to quit before they achieve long-term success. Do your best to stick to your quit plan, and talk with your health care provider if you have any questions or concerns. Summary  Smoking tobacco is the leading cause of preventable death. Quitting smoking is one of the best things that you can do for your health.  When you decide to quit smoking, create a plan to help you succeed.  Quit smoking right away, not slowly over a period of time.  When you start quitting, seek help from your health care provider, family, or friends. This information is not intended to replace advice given to you by your health care provider. Make sure you discuss any questions you have with your health care provider. Document Released: 12/16/2000 Document Revised: 03/11/2018 Document Reviewed: 03/12/2018 Elsevier Patient Education  2020 [employer].

## 2018-08-15 NOTE — Progress Notes (Signed)
Name: Tyler OREGEL Sr.   MRN: [id number]    DOB: 11/02/1960   Date:08/15/2018       Progress Note  Subjective  Chief Complaint  Chief Complaint  Patient presents with  . Follow-up  . Medication Refill  . Plantar Warts    on bottom of left foot  . umblical hernia    wants referral- not to [place] [facility]  . Back Pain    arthritis    HPI Plantar wart  Bottom of left foot has been there for about 40 years, has used razor blade, salycylic acid and multiple other treatments without relief  Smoking  Smokes 1ppd, has tried to cut down but has not had success, is open to qutting.   Hypothyroidism Patient rx levothryoxine 129mcg daily. Has been on this dose for few years. Patient denies fatigue/palpitations, insomnia, constipation/diarrhea, hot/cold intolerances,   Lab Results  Component Value Date   TSH 2.44 01/03/2018   Hypertension Patient is on lisinopril 20mg  and HCTZ 12.5mg  daily, it is rx and 1.5 tabs daily but states he is only taking one tab daily  with no missed doses a month.  He is relatively compliant with low-salt diet.  Denies chest pain, headaches, blurry vision. BP Readings from Last 3 Encounters:  08/15/18 132/74  02/16/18 125/76  01/03/18 136/74    Hyperlipidemia Patient rx simvastatin 40mg  daily Takes medications as prescribed with no missed doses a month.  Diet: eats veggies 4 times a week, eats a lot of fried foods.  Denies myalgias Lab Results  Component Value Date   CHOL 145 01/03/2018   HDL 33 (L) 01/03/2018   LDLCALC 88 01/03/2018   TRIG 147 01/03/2018   CHOLHDL 4.4 01/03/2018    Bipolar disorder He is taking paxil 20mg  daily has been on this dose for over 10 years and it works well for him; noted it is prescribed as BID but states has never been on it like that. Denies manic episodes.    PHQ2/9: Depression screen [facility] 2/9 08/15/2018 01/03/2018 07/02/2017 10/09/2016 04/03/2016  Decreased Interest 0 0 0 0 0  Down, Depressed, Hopeless 0 3 3  0 1  PHQ - 2 Score 0 3 3 0 1  Altered sleeping 0 1 0 - -  Tired, decreased energy 0 3 3 - -  Change in appetite 0 0 0 - -  Feeling bad or failure about yourself  0 1 1 - -  Trouble concentrating 0 1 0 - -  Moving slowly or fidgety/restless 0 0 0 - -  Suicidal thoughts 0 0 0 - -  PHQ-9 Score 0 9 7 - -  Difficult doing work/chores Not difficult at all Somewhat difficult Somewhat difficult - -    PHQ reviewed. Negative  Patient Active Problem List   Diagnosis Date Noted  . B12 deficiency 09/12/2017  . Overweight (BMI 25.0-29.9) 07/02/2017  . Herpes 01/03/2016  . Needs flu shot 09/04/2015  . Hyperglycemia 09/04/2015  . Umbilical hernia without obstruction and without gangrene 04/22/2015  . Umbilical hernia 94/80/1655  . Scoliosis 01/08/2015  . Spinal stenosis, lumbar 01/08/2015  . DDD (degenerative disc disease), lumbar 01/08/2015  . Medication monitoring encounter 01/08/2015  . Blood in the stool 08/24/2014  . Agoraphobia with panic attacks 08/24/2014  . Snoring 08/24/2014  . AAT (alpha-1-antitrypsin) deficiency ([place])   . Tobacco use   . Hyperlipidemia   . Hypertension   . Hypothyroidism   . Thyroid cyst   . Anxiety   .  Bipolar disorder ([place])   . Vitamin D deficiency disease   . Insomnia   . Panic attacks   . Secondary erythrocytosis 05/08/2014    Past Medical History:  Diagnosis Date  . AAT (alpha-1-antitrypsin) deficiency ([place])   . Anxiety   . Arthritis    SPINE  . Chronic low back pain   . COPD (chronic obstructive pulmonary disease) ([place])   . Depression   . Goiter   . Gout   . Hyperlipidemia   . Hypertension   . Hypothyroidism   . Insomnia   . Panic attacks   . Thyroid cyst    x 2  . TMJ (temporomandibular joint disorder)   . Tobacco use   . Vitamin D deficiency disease     Past Surgical History:  Procedure Laterality Date  . COLONOSCOPY  2013   Dr. Orest Dikes 2    Social History   Tobacco Use  . Smoking status: Current Every Day Smoker     Packs/day: 1.00    Years: 40.00    Pack years: 40.00    Types: Cigarettes  . Smokeless tobacco: Never Used  Substance Use Topics  . Alcohol use: No     Current Outpatient Medications:  .  ibuprofen (ADVIL,MOTRIN) 200 MG tablet, Take 200 mg by mouth 2 (two) times daily., Disp: , Rfl:  .  levothyroxine (SYNTHROID) 100 MCG tablet, TAKE 1 TABLET BY MOUTH EVERY DAY, Disp: 30 tablet, Rfl: 0 .  lisinopril-hydrochlorothiazide (ZESTORETIC) 20-12.5 MG tablet, TAKE 1 AND 1/2 TABLETS DAILY BY MOUTH, Disp: 45 tablet, Rfl: 0 .  PARoxetine (PAXIL) 20 MG tablet, TAKE 1 TABLET BY MOUTH TWICE A DAY, Disp: 60 tablet, Rfl: 5 .  simvastatin (ZOCOR) 40 MG tablet, TAKE 1 TABLET BY MOUTH EVERYDAY AT BEDTIME, Disp: 30 tablet, Rfl: 0 .  magnesium oxide (MAG-OX) 400 MG tablet, Take 1 tablet (400 mg total) by mouth daily. (Patient not taking: Reported on 08/15/2018), Disp: 30 tablet, Rfl: 0  Allergies  Allergen Reactions  . No Known Allergies     Review of Systems  Constitutional: Negative for chills, fever and malaise/fatigue.  HENT: Negative for congestion, sinus pain and sore throat.   Eyes: Negative for blurred vision.  Respiratory: Negative for cough and shortness of breath.   Cardiovascular: Negative for chest pain, palpitations and leg swelling.  Gastrointestinal: Negative for abdominal pain, constipation, diarrhea and nausea.  Genitourinary: Negative for dysuria.  Musculoskeletal: Negative for falls and joint pain.  Skin: Negative for rash.  Neurological: Negative for dizziness, tingling and headaches.  Endo/Heme/Allergies: Negative for polydipsia.  Psychiatric/Behavioral: The patient is nervous/anxious. The patient does not have insomnia.     No other specific complaints in a complete review of systems (except as listed in HPI above).  Objective  Vitals:   08/15/18 1032  BP: 132/74  Pulse: 71  Resp: 14  Temp: (!) 97.3 F (36.3 C)  SpO2: 97%  Weight: 203 lb 9.6 oz (92.4 kg)  Height: 5'  10" (1.778 m)    Body mass index is 29.21 kg/m.  Nursing Note and Vital Signs reviewed.  Physical Exam Vitals signs reviewed.  Constitutional:      Appearance: He is well-developed.  HENT:     Head: Normocephalic and atraumatic.  Neck:     Musculoskeletal: Normal range of motion and neck supple.     Vascular: No carotid bruit.  Cardiovascular:     Heart sounds: Normal heart sounds.  Pulmonary:     Effort: Pulmonary  effort is normal.     Breath sounds: Normal breath sounds.  Abdominal:     General: Bowel sounds are normal.     Palpations: Abdomen is soft.     Tenderness: There is no abdominal tenderness.     Hernia: A hernia (umbilical, non-reducible no pain) is present.  Musculoskeletal: Normal range of motion.       Feet:  Skin:    General: Skin is warm and dry.     Capillary Refill: Capillary refill takes less than 2 seconds.  Neurological:     Mental Status: He is alert and oriented to person, place, and time.     GCS: GCS eye subscore is 4. GCS verbal subscore is 5. GCS motor subscore is 6.     Sensory: No sensory deficit.  Psychiatric:        Speech: Speech normal.        Behavior: Behavior normal.        Thought Content: Thought content normal.        Judgment: Judgment normal.       No results found for this or any previous visit (from the past 48 hour(s)).  Assessment & Plan  1. Essential hypertension stable - lisinopril-hydrochlorothiazide (ZESTORETIC) 20-12.5 MG tablet; Take 1 tablet by mouth daily.  Dispense: 90 tablet; Refill: 3  2. Other specified hypothyroidism - TSH - levothyroxine (SYNTHROID) 100 MCG tablet; Take 1 tablet (100 mcg total) by mouth daily.  Dispense: 90 tablet; Refill: 3  3. Mixed hyperlipidemia - Lipid Profile - simvastatin (ZOCOR) 40 MG tablet; Take 1 tablet (40 mg total) by mouth daily at 6 PM.  Dispense: 90 tablet; Refill: 3  4. Bipolar affective disorder, currently depressed, mild (HCC) controlled - PARoxetine (PAXIL)  20 MG tablet; Take 1 tablet (20 mg total) by mouth daily.  Dispense: 90 tablet; Refill: 3  5. Umbilical hernia without obstruction and without gangrene Asymptomatic, will hold off on surgery referral   6. Verruca plantaris - Ambulatory referral to Dermatology  7. Encounter for smoking cessation counseling Spent greater than 3 minutes discussing plan to cut down and use nicotine patches, due to psych history will hold on chantix at this time, consider wellbutrin with close follow-up in the future if needed.  - nicotine (NICODERM CQ - DOSED IN MG/24 HOURS) 14 mg/24hr patch; Place 1 patch (14 mg total) onto the skin daily.  Dispense: 28 patch; Refill: 0  8. Medication monitoring encounter - COMPLETE METABOLIC PANEL WITH GFR  9. Ectatic abdominal aorta (HCC) Seen in CT scan from 2015- recommended routine [place] in 5 years- patient declined today, discussed risks and benefits, recommend smoking cessation

## 2018-09-14 ENCOUNTER — Other Ambulatory Visit: Payer: Self-pay | Admitting: Nurse Practitioner

## 2018-09-14 DIAGNOSIS — Z716 Tobacco abuse counseling: Secondary | ICD-10-CM

## 2018-09-14 MED ORDER — NICOTINE 7 MG/24HR TD PT24: 7.0000 mg | MEDICATED_PATCH | Freq: Every day | TRANSDERMAL | 0 refills | Status: DC

## 2018-09-14 NOTE — Telephone Encounter (Signed)
Please let patient know I sent in the next step down on nicotine patches - 7mg .  He was using the 14mg  patches for the last month.  After someone completed the 14mg  patches, we usually step down to 7mg , then off completely.  Let me know if there are any questions or concerns. Thanks.

## 2018-10-12 ENCOUNTER — Other Ambulatory Visit: Payer: Self-pay | Admitting: Family Medicine

## 2018-10-12 DIAGNOSIS — F3131 Bipolar disorder, current episode depressed, mild: Secondary | ICD-10-CM

## 2018-10-12 NOTE — Telephone Encounter (Signed)
Requested medication (s) are due for refill today: yes  Requested medication (s) are on the active medication list: yes  Last refill:  09/14/2018  Future visit scheduled: yes  Notes to clinic:  Review for refill Was unable to approve under [facility]    Requested Prescriptions  Pending Prescriptions Disp Refills   PARoxetine (PAXIL) 20 MG tablet [Pharmacy Med Name: PAROXETINE HCL 20 MG TABLET] 60 tablet 3    Sig: TAKE 1 TABLET BY MOUTH TWICE A DAY     Psychiatry:  Antidepressants - SSRI Failed - 10/12/2018  9:13 AM      Failed - Completed PHQ-2 or PHQ-9 in the last 360 days.      Passed - Valid encounter within last 6 months    Recent Outpatient Visits          1 month ago Essential hypertension   [place], NP   9 months ago Essential hypertension   [place], Satira Anis, MD   1 year ago Mixed hyperlipidemia   [place], Satira Anis, MD   2 years ago Essential hypertension   [place], Satira Anis, MD   2 years ago Blood in the stool   [place], Satira Anis, MD      Future Appointments            In 4 months Delsa Grana, PA-C [facility]

## 2018-12-13 ENCOUNTER — Ambulatory Visit (INDEPENDENT_AMBULATORY_CARE_PROVIDER_SITE_OTHER): Payer: Medicaid Other | Admitting: Family Medicine

## 2018-12-13 ENCOUNTER — Encounter: Payer: Self-pay | Admitting: Family Medicine

## 2018-12-13 ENCOUNTER — Other Ambulatory Visit: Payer: Self-pay

## 2018-12-13 VITALS — BP 153/79 | HR 63 | Ht 70.0 in | Wt 197.0 lb

## 2018-12-13 DIAGNOSIS — J069 Acute upper respiratory infection, unspecified: Secondary | ICD-10-CM

## 2018-12-13 DIAGNOSIS — Z20828 Contact with and (suspected) exposure to other viral communicable diseases: Secondary | ICD-10-CM

## 2018-12-13 DIAGNOSIS — Z20822 Contact with and (suspected) exposure to covid-19: Secondary | ICD-10-CM

## 2018-12-13 NOTE — Progress Notes (Signed)
Name: Tyler BRODERSEN Sr.   MRN: [id number]    DOB: 06-Jan-1960   Date:12/13/2018       Progress Note  Subjective:    Chief Complaint  Chief Complaint  Patient presents with  . URI    onset 8 days symptoms include cough, headahce, congestion, sore throat and fatigue    I connected with  Dorene Ar Sr.  on 12/13/18 at  3:00 PM EST by a video enabled telemedicine application and verified that I am speaking with the correct person using two identifiers.  I discussed the limitations of evaluation and management by telemedicine and the availability of in person appointments. The patient expressed understanding and agreed to proceed. Staff also discussed with the patient that there may be a patient responsible charge related to this service. Patient Location: home Provider Location: [facility] clinc Additional Individuals present: none  HPI Pt sick for the past 8 days some mild improvement, just doesn't feel good.  11/30 sx started, patient was around his sister about 13 days ago on Wednesday before Thanksgiving she developed symptoms about 8 days ago as well and she was positive for Covid neither of them were sick at the time they were together.  Patient has not tested yet for Covid. URI symptoms This is a new problem. Episode onset: 8d. The problem has been gradually improving. There has been no fever. Associated symptoms include congestion, coughing, headaches, nausea, rhinorrhea, sneezing and a sore throat. Pertinent negatives include no abdominal pain, joint pain, joint swelling, neck pain, sinus pain, swollen glands, vomiting or wheezing. Associated symptoms comments: Fatigue with sleeping, HA, weak.   Patient was able to take his vital signs today in between talking to the [place] and speaking with me he reports that his HR 60's and BP 125/72. He vaguely mentions that he does have some intermittent pain in his chest but he will not elaborate on this when asked he denies any shortness of breath,  states that he is sleeping all the time very weak and tired but when asked how much she is sleeping in total or napping he states overall he is only sleeping about 8 hours a day and the rest the time he is just laying on the couch and resting.   Patient Active Problem List   Diagnosis Date Noted  . Ectatic abdominal aorta ([place]) 08/15/2018  . B12 deficiency 09/12/2017  . Overweight (BMI 25.0-29.9) 07/02/2017  . Herpes 01/03/2016  . Umbilical hernia without obstruction and without gangrene 04/22/2015  . Umbilical hernia [id number]  . Scoliosis 01/08/2015  . Spinal stenosis, lumbar 01/08/2015  . DDD (degenerative disc disease), lumbar 01/08/2015  . Agoraphobia with panic attacks 08/24/2014  . AAT (alpha-1-antitrypsin) deficiency ([place])   . Tobacco use   . Hyperlipidemia   . Hypertension   . Hypothyroidism   . Thyroid cyst   . Anxiety   . Bipolar disorder ([place])   . Vitamin D deficiency disease   . Insomnia   . Secondary erythrocytosis 05/08/2014    Social History   Tobacco Use  . Smoking status: Current Every Day Smoker    Packs/day: 1.00    Years: 40.00    Pack years: 40.00    Types: Cigarettes  . Smokeless tobacco: Never Used  Substance Use Topics  . Alcohol use: No     Current Outpatient Medications:  .  ibuprofen (ADVIL,MOTRIN) 200 MG tablet, Take 200 mg by mouth 2 (two) times daily., Disp: , Rfl:  .  levothyroxine (  SYNTHROID) 100 MCG tablet, Take 1 tablet (100 mcg total) by mouth daily., Disp: 90 tablet, Rfl: 3 .  lisinopril-hydrochlorothiazide (ZESTORETIC) 20-12.5 MG tablet, Take 1 tablet by mouth daily., Disp: 90 tablet, Rfl: 3 .  PARoxetine (PAXIL) 20 MG tablet, TAKE 1 TABLET BY MOUTH TWICE A DAY, Disp: 60 tablet, Rfl: 3 .  simvastatin (ZOCOR) 40 MG tablet, Take 1 tablet (40 mg total) by mouth daily at 6 PM., Disp: 90 tablet, Rfl: 3  Allergies  Allergen Reactions  . No Known Allergies     I personally reviewed active problem list, medication list, allergies,  family history, social history, health maintenance, notes from last encounter, lab results, imaging with the patient/caregiver today.   Review of Systems  Constitutional: Negative.   HENT: Negative.   Eyes: Negative.   Respiratory: Negative.   Cardiovascular: Negative.   Gastrointestinal: Negative.   Endocrine: Negative.   Genitourinary: Negative.   Musculoskeletal: Negative.   Skin: Negative.   Allergic/Immunologic: Negative.   Neurological: Negative.   Hematological: Negative.   Psychiatric/Behavioral: Negative.   All other systems reviewed and are negative.    Objective:   Virtual encounter, vitals limited, only able to obtain the following Today's Vitals   12/13/18 1448  BP: (!) 153/79  Pulse: 63  Weight: 197 lb (89.4 kg)  Height: 5\' 10"  (1.778 m)   Body mass index is 28.27 kg/m. Nursing Note and Vital Signs reviewed.  Physical Exam Vitals signs and nursing note reviewed.  Neck:     Comments: Normal phonation Pulmonary:     Comments: No audible tachypnea, wheeze or stridor, patient speaking in short but full sentences Neurological:     Mental Status: He is alert.  Psychiatric:        Mood and Affect: Mood normal.        Behavior: Behavior normal.     PE limited by telephone encounter  No results found for this or any previous visit (from the past 72 hour(s)).  Assessment and Plan:     ICD-10-CM   1. Upper respiratory tract infection, unspecified type  J06.9   2. Suspected COVID-19 virus infection  Z20.828   Patient was potentially exposed to Covid and has URI and viral type symptoms suspicious for possible Covid, he was instructed to quarantine, go get tested tomorrow, supportive body with antihistamines, decongestants, Tylenol, ibuprofen, pushing fluids and resting.  He was also encouraged to isolate if his test is positive and we will need to discuss with him when he can be cleared from isolation.  He does sound fairly symptomatic on the phone but it  has been 8 days since symptom onset and he has been fever free this entire time so he may be able to be cleared relatively soon if he is positive.  There is a chance that he still may be negative because of delayed testing.  I would like him to stay in quarantine for at least the next 3 days even with a negative test Reviewed red flags for emergent evaluation or calling [phone] patient verbalizes understanding.  -Red flags and when to present for emergency care or RTC including fever >101.62F, chest pain, shortness of breath, new/worsening/un-resolving symptoms,  reviewed with patient at time of visit. Follow up and care instructions discussed and provided in AVS. - I discussed the assessment and treatment plan with the patient. The patient was provided an opportunity to ask questions and all were answered. The patient agreed with the plan and demonstrated an understanding of  the instructions.  I provided 15 minutes of non-face-to-face time during this encounter.  Delsa Grana, PA-C 12/13/18 5:52 PM

## 2018-12-19 DIAGNOSIS — Z20828 Contact with and (suspected) exposure to other viral communicable diseases: Secondary | ICD-10-CM | POA: Diagnosis not present

## 2019-02-15 DIAGNOSIS — R220 Localized swelling, mass and lump, head: Secondary | ICD-10-CM | POA: Diagnosis not present

## 2019-02-24 ENCOUNTER — Ambulatory Visit (INDEPENDENT_AMBULATORY_CARE_PROVIDER_SITE_OTHER): Payer: Medicaid Other | Admitting: Family Medicine

## 2019-02-24 ENCOUNTER — Encounter: Payer: Self-pay | Admitting: Family Medicine

## 2019-02-24 ENCOUNTER — Other Ambulatory Visit: Payer: Self-pay

## 2019-02-24 VITALS — BP 126/68 | HR 78 | Temp 98.5°F | Resp 14 | Ht 70.0 in | Wt 205.3 lb

## 2019-02-24 DIAGNOSIS — Z1329 Encounter for screening for other suspected endocrine disorder: Secondary | ICD-10-CM | POA: Diagnosis not present

## 2019-02-24 DIAGNOSIS — E782 Mixed hyperlipidemia: Secondary | ICD-10-CM | POA: Diagnosis not present

## 2019-02-24 DIAGNOSIS — Z125 Encounter for screening for malignant neoplasm of prostate: Secondary | ICD-10-CM | POA: Diagnosis not present

## 2019-02-24 DIAGNOSIS — Z114 Encounter for screening for human immunodeficiency virus [HIV]: Secondary | ICD-10-CM | POA: Diagnosis not present

## 2019-02-24 DIAGNOSIS — E038 Other specified hypothyroidism: Secondary | ICD-10-CM | POA: Diagnosis not present

## 2019-02-24 DIAGNOSIS — Z0000 Encounter for general adult medical examination without abnormal findings: Secondary | ICD-10-CM

## 2019-02-24 DIAGNOSIS — Z1159 Encounter for screening for other viral diseases: Secondary | ICD-10-CM | POA: Diagnosis not present

## 2019-02-24 DIAGNOSIS — I10 Essential (primary) hypertension: Secondary | ICD-10-CM | POA: Diagnosis not present

## 2019-02-24 DIAGNOSIS — Z130 Encounter for screening for diseases of the blood and blood-forming organs and certain disorders involving the immune mechanism: Secondary | ICD-10-CM

## 2019-02-24 DIAGNOSIS — Z13228 Encounter for screening for other metabolic disorders: Secondary | ICD-10-CM

## 2019-02-24 DIAGNOSIS — Z5181 Encounter for therapeutic drug level monitoring: Secondary | ICD-10-CM

## 2019-02-24 NOTE — Patient Instructions (Signed)
Preventive Care 41-61 Years Old, Male Preventive care refers to lifestyle choices and visits with your health care provider that can promote health and wellness. This includes:  A yearly physical exam. This is also called an annual well check.  Regular dental and eye exams.  Immunizations.  Screening for certain conditions.  Healthy lifestyle choices, such as eating a healthy diet, getting regular exercise, not using drugs or products that contain nicotine and tobacco, and limiting alcohol use. What can I expect for my preventive care visit? Physical exam Your health care provider will check:  Height and weight. These may be used to calculate body mass index (BMI), which is a measurement that tells if you are at a healthy weight.  Heart rate and blood pressure.  Your skin for abnormal spots. Counseling Your health care provider may ask you questions about:  Alcohol, tobacco, and drug use.  Emotional well-being.  Home and relationship well-being.  Sexual activity.  Eating habits.  Work and work Statistician. What immunizations do I need?  Influenza (flu) vaccine  This is recommended every year. Tetanus, diphtheria, and pertussis (Tdap) vaccine  You may need a Td booster every 10 years. Varicella (chickenpox) vaccine  You may need this vaccine if you have not already been vaccinated. Zoster (shingles) vaccine  You may need this after age 64. Measles, mumps, and rubella (MMR) vaccine  You may need at least one dose of MMR if you were born in 1957 or later. You may also need a second dose. Pneumococcal conjugate (PCV13) vaccine  You may need this if you have certain conditions and were not previously vaccinated. Pneumococcal polysaccharide (PPSV23) vaccine  You may need one or two doses if you smoke cigarettes or if you have certain conditions. Meningococcal conjugate (MenACWY) vaccine  You may need this if you have certain conditions. Hepatitis A  vaccine  You may need this if you have certain conditions or if you travel or work in places where you may be exposed to hepatitis A. Hepatitis B vaccine  You may need this if you have certain conditions or if you travel or work in places where you may be exposed to hepatitis B. Haemophilus influenzae type b (Hib) vaccine  You may need this if you have certain risk factors. Human papillomavirus (HPV) vaccine  If recommended by your health care provider, you may need three doses over 6 months. You may receive vaccines as individual doses or as more than one vaccine together in one shot (combination vaccines). Talk with your health care provider about the risks and benefits of combination vaccines. What tests do I need? Blood tests  Lipid and cholesterol levels. These may be checked every 5 years, or more frequently if you are over 60 years old.  Hepatitis C test.  Hepatitis B test. Screening  Lung cancer screening. You may have this screening every year starting at age 43 if you have a 30-pack-year history of smoking and currently smoke or have quit within the past 15 years.  Prostate cancer screening. Recommendations will vary depending on your family history and other risks.  Colorectal cancer screening. All adults should have this screening starting at age 72 and continuing until age 2. Your health care provider may recommend screening at age 14 if you are at increased risk. You will have tests every 1-10 years, depending on your results and the type of screening test.  Diabetes screening. This is done by checking your blood sugar (glucose) after you have not eaten  for a while (fasting). You may have this done every 1-3 years.  Sexually transmitted disease (STD) testing. Follow these instructions at home: Eating and drinking  Eat a diet that includes fresh fruits and vegetables, whole grains, lean protein, and low-fat dairy products.  Take vitamin and mineral supplements as  recommended by your health care provider.  Do not drink alcohol if your health care provider tells you not to drink.  If you drink alcohol: ? Limit how much you have to 0-2 drinks a day. ? Be aware of how much alcohol is in your drink. In the [place] one drink equals one 12 oz bottle of beer (355 mL), one 5 oz glass of wine (148 mL), or one 1 oz glass of hard liquor (44 mL). Lifestyle  Take daily care of your teeth and gums.  Stay active. Exercise for at least 30 minutes on 5 or more days each week.  Do not use any products that contain nicotine or tobacco, such as cigarettes, e-cigarettes, and chewing tobacco. If you need help quitting, ask your health care provider.  If you are sexually active, practice safe sex. Use a condom or other form of protection to prevent STIs (sexually transmitted infections).  Talk with your health care provider about taking a low-dose aspirin every day starting at age 53. What's next?  Go to your health care provider once a year for a well check visit.  Ask your health care provider how often you should have your eyes and teeth checked.  Stay up to date on all vaccines. This information is not intended to replace advice given to you by your health care provider. Make sure you discuss any questions you have with your health care provider. Document Revised: 12/16/2017 Document Reviewed: 12/16/2017 Elsevier Patient Education  2020 [employer].

## 2019-02-24 NOTE — Progress Notes (Signed)
Patient: Tyler Maggart., Male    DOB: Aug 28, 1960, 59 y.o.   MRN: [id number] Tyler Courser, MD Visit Date: 02/24/2019  Today's Provider: Delsa Grana, PA-C   Chief Complaint  Patient presents with  . Annual Exam   Subjective:   Annual physical exam:  Tyler Cobb. is a 59 y.o. male who presents today for health maintenance Tyler annual & complete physical exam.  He feels fairly well.  He reports exercising - none Diet - eats twice a day, snacks, eggs for breakfast, sandwiches or soup He reports he is sleeping fairly well - sometimes sleeps well Tyler sometimes doesn't sleep  Takes care of his mother who is blind has dementia - he started to have extra stress Tyler depression 3-4 years ago    USPSTF grade A Tyler B recommendations - reviewed Tyler addressed today  Depression:  Phq 9 completed today by patient, was reviewed by me with patient in the room, score is  negative, pt feels overall good, but sometimes its hard with caring for his mom Tyler the "[place] has [place]"  [place] politics [facility] 2/9 Scores 02/24/2019 12/13/2018 08/15/2018 01/03/2018  PHQ - 2 Score 1 4 0 3  PHQ- 9 Score 2 6 0 9   Depression screen [facility] 2/9 02/24/2019 12/13/2018 08/15/2018 01/03/2018 07/02/2017  Decreased Interest 0 1 0 0 0  Down, Depressed, Hopeless 1 3 0 3 3  PHQ - 2 Score 1 4 0 3 3  Altered sleeping 1 1 0 1 0  Tired, decreased energy 0 1 0 3 3  Change in appetite 0 0 0 0 0  Feeling bad or failure about yourself  0 0 0 1 1  Trouble concentrating 0 0 0 1 0  Moving slowly or fidgety/restless 0 0 0 0 0  Suicidal thoughts 0 0 0 0 0  PHQ-9 Score 2 6 0 9 7  Difficult doing work/chores Not difficult at all Somewhat difficult Not difficult at all Somewhat difficult Somewhat difficult    Hep C Screening: doing today STD testing Tyler prevention (HIV/chl/gon/syphilis): HIV today, no other STDs needed  Prostate cancer:   Prostate cancer screening with PSA: Discussed risks Tyler benefits of PSA testing Tyler  provided handout. Pt wishes to have PSA drawn today.  No results found for: PSA Intimate partner violence:  none  Urinary Symptoms:  IPSS Questionnaire (AUA-7): Over the past month.   1)  How often have you had a sensation of not emptying your bladder completely after you finish urinating?  0 - Not at all  2)  How often have you had to urinate again less than two hours after you finished urinating? 1 - Less than 1 time in 5  3)  How often have you found you stopped Tyler started again several times when you urinated?  1 - Less than 1 time in 5  4) How difficult have you found it to postpone urination?  0 - Not at all  5) How often have you had a weak urinary stream?  1 - Less than 1 time in 5  6) How often have you had to push or strain to begin urination?  0 - Not at all  7) How many times did you most typically get up to urinate from the time you went to bed until the time you got up in the morning?  0 - None  Total score:  0-7 mildly symptomatic   8-19 moderately symptomatic  20-35 severely symptomatic    Advanced Care Planning:  A voluntary discussion about advance care planning including the explanation Tyler discussion of advance directives.  Discussed health care proxy Tyler Living will, Tyler the patient was able to identify a health care proxy as -  Has no one - daughter - Tyler MetcaffPatient does not have a living will at present time. If patient does have living will, I have requested they bring this to the clinic to be scanned in to their chart.  Skin cancer:  Pt reports no hx of skin cancer, suspicious lesions/biopsies in the past.  Colorectal cancer:  colonoscopy is UTD - 2023  Lung cancer:   Low Dose CT Chest recommended if Age 86-80 years, 30 pack-year currently smoking OR have quit w/in 15years. Patient does qualify.   Social History   Tobacco Use  . Smoking status: Current Every Day Smoker    Packs/day: 1.00    Years: 40.00    Pack years: 40.00    Types: Cigarettes  .  Smokeless tobacco: Never Used  Substance Use Topics  . Alcohol use: No    Alcohol screening:   Office Visit from 02/24/2019 in [facility]  AUDIT-C Score  0     AAA:  Not indicated now for age The USPSTF recommends one-time screening with ultrasonography in men ages 1 to 36 years who have ever smoked  ECG:  None today  Blood pressure/Hypertension: BP Readings from Last 3 Encounters:  02/24/19 126/68  12/13/18 (!) 153/79  08/15/18 132/74   Weight/Obesity: Wt Readings from Last 3 Encounters:  02/24/19 205 lb 4.8 oz (93.1 kg)  12/13/18 197 lb (89.4 kg)  08/15/18 203 lb 9.6 oz (92.4 kg)   BMI Readings from Last 3 Encounters:  02/24/19 29.46 kg/m  12/13/18 28.27 kg/m  08/15/18 29.21 kg/m    Lipids:  Lab Results  Component Value Date   CHOL 142 08/15/2018   CHOL 145 01/03/2018   CHOL 127 07/02/2017   Lab Results  Component Value Date   HDL 29 (L) 08/15/2018   HDL 33 (L) 01/03/2018   HDL 32 (L) 07/02/2017   Lab Results  Component Value Date   LDLCALC 92 08/15/2018   LDLCALC 88 01/03/2018   LDLCALC 71 07/02/2017   Lab Results  Component Value Date   TRIG 110 08/15/2018   TRIG 147 01/03/2018   TRIG 163 (H) 07/02/2017   Lab Results  Component Value Date   CHOLHDL 4.9 08/15/2018   CHOLHDL 4.4 01/03/2018   CHOLHDL 4.0 07/02/2017   No results found for: LDLDIRECT Based on the results of lipid panel his/her cardiovascular risk factor ( using [place] )  in the next 10 years is : The 10-year ASCVD risk score [name] DC Brooke Bonito., et al., 2013) is: 16.6%   Values used to calculate the score:     Age: 50 years     Sex: Male     Is Non-Hispanic African American: No     Diabetic: No     Tobacco smoker: Yes     Systolic Blood Pressure: 123XX123 mmHg     Is BP treated: Yes     HDL Cholesterol: 29 mg/dL     Total Cholesterol: 142 mg/dL Glucose:  Glucose, Bld  Date Value Ref Range Status  08/15/2018 84 65 - 99 mg/dL Final    Comment:    .             Fasting reference interval .  01/03/2018 93 65 - 99 mg/dL Final    Comment:    .            Fasting reference interval .   07/02/2017 86 65 - 139 mg/dL Final    Comment:    .        Non-fasting reference interval .     Social History      He  reports that he has been smoking cigarettes. He has a 40.00 pack-year smoking history. He has never used smokeless tobacco. He reports that he does not drink alcohol or use drugs.       Social History   Socioeconomic History  . Marital status: Divorced    Spouse name: Not on file  . Number of children: Not on file  . Years of education: Not on file  . Highest education level: Not on file  Occupational History  . Not on file  Tobacco Use  . Smoking status: Current Every Day Smoker    Packs/day: 1.00    Years: 40.00    Pack years: 40.00    Types: Cigarettes  . Smokeless tobacco: Never Used  Substance Tyler Sexual Activity  . Alcohol use: No  . Drug use: No  . Sexual activity: Not Currently  Other Topics Concern  . Not on file  Social History Narrative  . Not on file   Social Determinants of Health   Financial Resource Strain:   . Difficulty of Paying Living Expenses: Not on file  Food Insecurity:   . Worried About Charity fundraiser in the Last Year: Not on file  . Ran Out of Food in the Last Year: Not on file  Transportation Needs:   . Lack of Transportation (Medical): Not on file  . Lack of Transportation (Non-Medical): Not on file  Physical Activity:   . Days of Exercise per Week: Not on file  . Minutes of Exercise per Session: Not on file  Stress:   . Feeling of Stress : Not on file  Social Connections:   . Frequency of Communication with Friends Tyler Family: Not on file  . Frequency of Social Gatherings with Friends Tyler Family: Not on file  . Attends Religious Services: Not on file  . Active Member of Clubs or Organizations: Not on file  . Attends Archivist Meetings: Not on file  . Marital  Status: Not on file         Social History   Socioeconomic History  . Marital status: Divorced    Spouse name: Not on file  . Number of children: Not on file  . Years of education: Not on file  . Highest education level: Not on file  Occupational History  . Not on file  Tobacco Use  . Smoking status: Current Every Day Smoker    Packs/day: 1.00    Years: 40.00    Pack years: 40.00    Types: Cigarettes  . Smokeless tobacco: Never Used  Substance Tyler Sexual Activity  . Alcohol use: No  . Drug use: No  . Sexual activity: Not Currently  Other Topics Concern  . Not on file  Social History Narrative  . Not on file   Social Determinants of Health   Financial Resource Strain:   . Difficulty of Paying Living Expenses: Not on file  Food Insecurity:   . Worried About Charity fundraiser in the Last Year: Not on file  . Ran Out of Food in the Last  Year: Not on file  Transportation Needs:   . Lack of Transportation (Medical): Not on file  . Lack of Transportation (Non-Medical): Not on file  Physical Activity:   . Days of Exercise per Week: Not on file  . Minutes of Exercise per Session: Not on file  Stress:   . Feeling of Stress : Not on file  Social Connections:   . Frequency of Communication with Friends Tyler Family: Not on file  . Frequency of Social Gatherings with Friends Tyler Family: Not on file  . Attends Religious Services: Not on file  . Active Member of Clubs or Organizations: Not on file  . Attends Archivist Meetings: Not on file  . Marital Status: Not on file  Intimate Partner Violence:   . Fear of Current or Ex-Partner: Not on file  . Emotionally Abused: Not on file  . Physically Abused: Not on file  . Sexually Abused: Not on file    Family History        Family Status  Relation Name Status  . Father  Deceased at age 42       CHF  . Mother  Alive  . Brother 1 Alive  . PGF  Deceased  . Sister 1 Alive  . MGM  Deceased  . MGF  Deceased        COPD  . PGM  Deceased  . Neg Hx  (Not Specified)        His family history includes Alcohol abuse in his father Tyler paternal grandfather; CAD in his brother; COPD in his father Tyler maternal grandfather; Dementia in his mother; Heart attack in his brother Tyler father; Heart disease in his father; Heart failure in his father; Hypertension in his brother Tyler mother. There is no history of Cancer, Diabetes, or Stroke.       Family History  Problem Relation Age of Onset  . Heart disease Father   . Heart failure Father   . Heart attack Father        x 5  . Alcohol abuse Father   . COPD Father   . Dementia Mother   . Hypertension Mother   . Heart attack Brother   . CAD Brother   . Hypertension Brother   . Alcohol abuse Paternal Grandfather   . COPD Maternal Grandfather   . Cancer Neg Hx   . Diabetes Neg Hx   . Stroke Neg Hx     Patient Active Problem List   Diagnosis Date Noted  . Ectatic abdominal aorta ([place]) 08/15/2018  . B12 deficiency 09/12/2017  . Overweight (BMI 25.0-29.9) 07/02/2017  . Herpes 01/03/2016  . Umbilical hernia without obstruction Tyler without gangrene 04/22/2015  . Umbilical hernia [id number]  . Scoliosis 01/08/2015  . Spinal stenosis, lumbar 01/08/2015  . DDD (degenerative disc disease), lumbar 01/08/2015  . Agoraphobia with panic attacks 08/24/2014  . AAT (alpha-1-antitrypsin) deficiency ([place])   . Tobacco use   . Hyperlipidemia   . Hypertension   . Hypothyroidism   . Thyroid cyst   . Anxiety   . Bipolar disorder ([place])   . Vitamin D deficiency disease   . Insomnia   . Secondary erythrocytosis 05/08/2014    Past Surgical History:  Procedure Laterality Date  . COLONOSCOPY  2013   Dr. Orest Dikes 2     Current Outpatient Medications:  .  ibuprofen (ADVIL,MOTRIN) 200 MG tablet, Take 200 mg by mouth 2 (two) times daily., Disp: , Rfl:  .  levothyroxine (SYNTHROID) 100 MCG tablet, Take 1 tablet (100 mcg total) by mouth daily., Disp: 90 tablet, Rfl: 3 .   lisinopril-hydrochlorothiazide (ZESTORETIC) 20-12.5 MG tablet, Take 1 tablet by mouth daily., Disp: 90 tablet, Rfl: 3 .  PARoxetine (PAXIL) 20 MG tablet, TAKE 1 TABLET BY MOUTH TWICE A DAY, Disp: 60 tablet, Rfl: 3 .  simvastatin (ZOCOR) 40 MG tablet, Take 1 tablet (40 mg total) by mouth daily at 6 PM., Disp: 90 tablet, Rfl: 3  Allergies  Allergen Reactions  . No Known Allergies     Patient Care Team: Lada, Satira Anis, MD as PCP - General (Family Medicine) Lada, Satira Anis, MD as Attending Physician (Family Medicine) Bary Castilla, Forest Gleason, MD (General Surgery) Cammie Sickle, MD as Consulting Physician (Internal Medicine)  I personally reviewed active problem list, medication list, allergies, family history, social history, health maintenance, notes from last encounter, lab results, imaging with the patient/caregiver today.   Review of Systems  Constitutional: Negative.  Negative for activity change, appetite change, fatigue Tyler unexpected weight change.  HENT: Negative.   Eyes: Negative.   Respiratory: Negative.  Negative for shortness of breath.   Cardiovascular: Negative.  Negative for chest pain, palpitations Tyler leg swelling.  Gastrointestinal: Negative.  Negative for abdominal pain Tyler blood in stool.  Endocrine: Negative.   Genitourinary: Negative.  Negative for decreased urine volume, difficulty urinating, testicular pain Tyler urgency.  Skin: Negative.  Negative for color change Tyler pallor.  Allergic/Immunologic: Negative.   Neurological: Negative.  Negative for syncope, weakness, light-headedness Tyler numbness.  Psychiatric/Behavioral: Negative.  Negative for confusion, dysphoric mood, self-injury Tyler suicidal ideas. The patient is not nervous/anxious.   All other systems reviewed Tyler are negative.         Objective:   Vitals:  Vitals:   02/24/19 0959  BP: 126/68  Pulse: 78  Resp: 14  Temp: 98.5 F (36.9 C)  SpO2: 96%  Weight: 205 lb 4.8 oz (93.1 kg)  Height:  5\' 10"  (1.778 m)    Body mass index is 29.46 kg/m.  Physical Exam Vitals Tyler nursing note reviewed.  Constitutional:      General: He is not in acute distress.    Appearance: Normal appearance. He is well-developed. He is obese. He is not ill-appearing, toxic-appearing or diaphoretic.     Interventions: Face mask in place.  HENT:     Head: Normocephalic Tyler atraumatic.     Jaw: No trismus.     Right Ear: External ear normal.     Left Ear: External ear normal.  Eyes:     General: Lids are normal. No scleral icterus.    Conjunctiva/sclera: Conjunctivae normal.     Pupils: Pupils are equal, round, Tyler reactive to light.  Neck:     Trachea: Trachea Tyler phonation normal. No tracheal deviation.  Cardiovascular:     Rate Tyler Rhythm: Normal rate Tyler regular rhythm.     Pulses: Normal pulses.          Radial pulses are 2+ on the right side Tyler 2+ on the left side.       Posterior tibial pulses are 2+ on the right side Tyler 2+ on the left side.     Heart sounds: Normal heart sounds. No murmur. No friction rub. No gallop.   Pulmonary:     Effort: Pulmonary effort is normal. No respiratory distress.     Breath sounds: Normal breath sounds. No stridor. No wheezing, rhonchi or rales.  Abdominal:  General: Bowel sounds are normal. There is no distension.     Palpations: Abdomen is soft.     Tenderness: There is no abdominal tenderness. There is no guarding or rebound.     Comments: Obese abd, protuberant   Musculoskeletal:        General: Normal range of motion.     Cervical back: Normal range of motion Tyler neck supple.     Right lower leg: No edema.     Left lower leg: No edema.  Skin:    General: Skin is warm Tyler dry.     Capillary Refill: Capillary refill takes less than 2 seconds.     Coloration: Skin is not jaundiced.     Findings: No rash.     Nails: There is no clubbing.  Neurological:     Mental Status: He is alert.     Cranial Nerves: No dysarthria or facial asymmetry.      Motor: No tremor or abnormal muscle tone.     Gait: Gait normal.  Psychiatric:        Mood Tyler Affect: Mood normal.        Speech: Speech normal.        Behavior: Behavior normal. Behavior is cooperative.      No results found for this or any previous visit (from the past 2160 hour(s)).  Diabetic Foot Exam: Diabetic Foot Exam - Simple   No data filed      PHQ2/9: Depression screen [facility] 2/9 02/24/2019 12/13/2018 08/15/2018 01/03/2018 07/02/2017  Decreased Interest 0 1 0 0 0  Down, Depressed, Hopeless 1 3 0 3 3  PHQ - 2 Score 1 4 0 3 3  Altered sleeping 1 1 0 1 0  Tired, decreased energy 0 1 0 3 3  Change in appetite 0 0 0 0 0  Feeling bad or failure about yourself  0 0 0 1 1  Trouble concentrating 0 0 0 1 0  Moving slowly or fidgety/restless 0 0 0 0 0  Suicidal thoughts 0 0 0 0 0  PHQ-9 Score 2 6 0 9 7  Difficult doing work/chores Not difficult at all Somewhat difficult Not difficult at all Somewhat difficult Somewhat difficult    Fall Risk: Fall Risk  02/24/2019 12/13/2018 08/15/2018 01/03/2018 10/13/2017  Falls in the past year? 0 0 0 0 No  Number falls in past yr: 0 0 0 0 -  Injury with Fall? 0 0 0 0 -    Functional Status Survey: Is the patient deaf or have difficulty hearing?: No Does the patient have difficulty seeing, even when wearing glasses/contacts?: No Does the patient have difficulty concentrating, remembering, or making decisions?: No Does the patient have difficulty walking or climbing stairs?: No Does the patient have difficulty dressing or bathing?: No Does the patient have difficulty doing errands alone such as visiting a doctor's office or shopping?: No   Assessment & Plan:    CPE completed today  . Prostate cancer screening Tyler PSA options (with potential risks Tyler benefits of testing vs not testing) were discussed along with recent recs/guidelines, shared decision making Tyler handout/information given to pt today  . USPSTF grade A Tyler B  recommendations reviewed with patient; age-appropriate recommendations, preventive care, screening tests, etc discussed Tyler encouraged; healthy living encouraged; see AVS for patient education given to patient  . Discussed importance of 150 minutes of physical activity weekly, AHA exercise recommendations given to pt in AVS/handout  . Discussed importance of healthy diet:  eating lean meats Tyler proteins, avoiding trans fats Tyler saturated fats, avoid simple sugars Tyler excessive carbs in diet, eat 6 servings of fruit/vegetables daily Tyler drink plenty of water Tyler avoid sweet beverages.  DASH diet reviewed if pt has HTN  . Recommended pt to do annual eye exam Tyler routine dental exams/cleanings  . Reviewed Health Maintenance: Health Maintenance  Topic Date Due  . Hepatitis C Screening  06-25-1960  . HIV Screening  04/02/1973  . INFLUENZA VACCINE  04/05/2019 (Originally 08/06/2018)  . COLONOSCOPY  05/05/2021  . TETANUS/TDAP  01/02/2026    . Immunizations: Immunization History  Administered Date(s) Administered  . Influenza,inj,Quad PF,6+ Mos 01/08/2015, 09/04/2015, 10/09/2016, 01/03/2018  . Tdap 01/03/2016    Tyler Grana, PA-C 02/24/19 10:32 AM  [facility]

## 2019-02-27 ENCOUNTER — Other Ambulatory Visit: Payer: Self-pay | Admitting: Family Medicine

## 2019-02-27 DIAGNOSIS — E782 Mixed hyperlipidemia: Secondary | ICD-10-CM

## 2019-02-27 DIAGNOSIS — E038 Other specified hypothyroidism: Secondary | ICD-10-CM

## 2019-02-27 DIAGNOSIS — F3131 Bipolar disorder, current episode depressed, mild: Secondary | ICD-10-CM

## 2019-02-27 DIAGNOSIS — I10 Essential (primary) hypertension: Secondary | ICD-10-CM

## 2019-02-27 LAB — HEPATITIS C ANTIBODY
Hepatitis C Ab: NONREACTIVE
SIGNAL TO CUT-OFF: 0.03 (ref <1.00)

## 2019-02-27 LAB — COMPLETE METABOLIC PANEL WITH GFR
AG Ratio: 1.4 (calc) (ref 1.0–2.5)
ALT: 20 U/L (ref 9–46)
AST: 23 U/L (ref 10–35)
Albumin: 4.6 g/dL (ref 3.6–5.1)
Alkaline phosphatase (APISO): 106 U/L (ref 35–144)
BUN: 14 mg/dL (ref 7–25)
CO2: 29 mmol/L (ref 20–32)
Calcium: 10 mg/dL (ref 8.6–10.3)
Chloride: 99 mmol/L (ref 98–110)
Creat: 0.88 mg/dL (ref 0.70–1.25)
GFR, Est African American: 108 mL/min/{1.73_m2} (ref >=60)
GFR, Est Non African American: 93 mL/min/{1.73_m2} (ref >=60)
Globulin: 3.2 g/dL (calc) (ref 1.9–3.7)
Glucose, Bld: 73 mg/dL (ref 65–99)
Potassium: 4.4 mmol/L (ref 3.5–5.3)
Sodium: 137 mmol/L (ref 135–146)
Total Bilirubin: 0.4 mg/dL (ref 0.2–1.2)
Total Protein: 7.8 g/dL (ref 6.1–8.1)

## 2019-02-27 LAB — LIPID PANEL
Cholesterol: 186 mg/dL (ref <200)
HDL: 37 mg/dL — ABNORMAL LOW (ref >=40)
LDL Cholesterol (Calc): 131 mg/dL (calc) — ABNORMAL HIGH
Non-HDL Cholesterol (Calc): 149 mg/dL (calc) — ABNORMAL HIGH (ref <130)
Total CHOL/HDL Ratio: 5 (calc) — ABNORMAL HIGH (ref <5.0)
Triglycerides: 85 mg/dL (ref <150)

## 2019-02-27 LAB — CBC WITH DIFFERENTIAL/PLATELET
Absolute Monocytes: 766 cells/uL (ref 200–950)
Basophils Absolute: 133 cells/uL (ref 0–200)
Basophils Relative: 1.2 %
Eosinophils Absolute: 355 cells/uL (ref 15–500)
Eosinophils Relative: 3.2 %
HCT: 52.9 % — ABNORMAL HIGH (ref 38.5–50.0)
Hemoglobin: 17.9 g/dL — ABNORMAL HIGH (ref 13.2–17.1)
Lymphs Abs: 2653 cells/uL (ref 850–3900)
MCH: 29.2 pg (ref 27.0–33.0)
MCHC: 33.8 g/dL (ref 32.0–36.0)
MCV: 86.2 fL (ref 80.0–100.0)
MPV: 10.1 fL (ref 7.5–12.5)
Monocytes Relative: 6.9 %
Neutro Abs: 7193 cells/uL (ref 1500–7800)
Neutrophils Relative %: 64.8 %
Platelets: 404 10*3/uL — ABNORMAL HIGH (ref 140–400)
RBC: 6.14 10*6/uL — ABNORMAL HIGH (ref 4.20–5.80)
RDW: 14.1 % (ref 11.0–15.0)
Total Lymphocyte: 23.9 %
WBC: 11.1 10*3/uL — ABNORMAL HIGH (ref 3.8–10.8)

## 2019-02-27 LAB — PSA: PSA: 1.1 ng/mL (ref <=4.0)

## 2019-02-27 LAB — HEMOGLOBIN A1C
Hgb A1c MFr Bld: 5.7 % of total Hgb — ABNORMAL HIGH (ref <5.7)
Mean Plasma Glucose: 117 (calc)
eAG (mmol/L): 6.5 (calc)

## 2019-02-27 LAB — HIV ANTIBODY (ROUTINE TESTING W REFLEX): HIV 1&2 Ab, 4th Generation: NONREACTIVE

## 2019-02-27 LAB — TSH: TSH: 2.4 mIU/L (ref 0.40–4.50)

## 2019-02-27 MED ORDER — LEVOTHYROXINE SODIUM 100 MCG PO TABS: 100.0000 ug | ORAL_TABLET | Freq: Every day | ORAL | 3 refills | Status: DC

## 2019-02-27 MED ORDER — PAROXETINE HCL 20 MG PO TABS: 20.0000 mg | ORAL_TABLET | Freq: Two times a day (BID) | ORAL | 3 refills | Status: DC

## 2019-02-27 MED ORDER — LISINOPRIL-HYDROCHLOROTHIAZIDE 20-12.5 MG PO TABS: 1.0000 | ORAL_TABLET | Freq: Every day | ORAL | 3 refills | Status: DC

## 2019-02-27 MED ORDER — SIMVASTATIN 40 MG PO TABS: 40.0000 mg | ORAL_TABLET | Freq: Every day | ORAL | 3 refills | Status: DC

## 2019-07-06 DIAGNOSIS — Z419 Encounter for procedure for purposes other than remedying health state, unspecified: Secondary | ICD-10-CM | POA: Diagnosis not present

## 2019-08-22 ENCOUNTER — Encounter: Payer: Self-pay | Admitting: Family Medicine

## 2019-08-22 ENCOUNTER — Ambulatory Visit (INDEPENDENT_AMBULATORY_CARE_PROVIDER_SITE_OTHER): Payer: Medicaid Other | Admitting: Family Medicine

## 2019-08-22 ENCOUNTER — Other Ambulatory Visit: Payer: Self-pay

## 2019-08-22 VITALS — BP 118/74 | HR 68 | Temp 98.1°F | Resp 18 | Ht 70.0 in | Wt 207.9 lb

## 2019-08-22 DIAGNOSIS — E038 Other specified hypothyroidism: Secondary | ICD-10-CM

## 2019-08-22 DIAGNOSIS — R05 Cough: Secondary | ICD-10-CM

## 2019-08-22 DIAGNOSIS — E782 Mixed hyperlipidemia: Secondary | ICD-10-CM

## 2019-08-22 DIAGNOSIS — D751 Secondary polycythemia: Secondary | ICD-10-CM

## 2019-08-22 DIAGNOSIS — F3131 Bipolar disorder, current episode depressed, mild: Secondary | ICD-10-CM

## 2019-08-22 DIAGNOSIS — Z5181 Encounter for therapeutic drug level monitoring: Secondary | ICD-10-CM | POA: Diagnosis not present

## 2019-08-22 DIAGNOSIS — R5383 Other fatigue: Secondary | ICD-10-CM

## 2019-08-22 DIAGNOSIS — F320 Major depressive disorder, single episode, mild: Secondary | ICD-10-CM

## 2019-08-22 DIAGNOSIS — F17200 Nicotine dependence, unspecified, uncomplicated: Secondary | ICD-10-CM

## 2019-08-22 DIAGNOSIS — R059 Cough, unspecified: Secondary | ICD-10-CM

## 2019-08-22 DIAGNOSIS — I10 Essential (primary) hypertension: Secondary | ICD-10-CM

## 2019-08-22 DIAGNOSIS — E663 Overweight: Secondary | ICD-10-CM

## 2019-08-22 LAB — CBC WITH DIFFERENTIAL/PLATELET
Absolute Monocytes: 717 cells/uL (ref 200–950)
Basophils Absolute: 131 cells/uL (ref 0–200)
Basophils Relative: 1.3 %
Eosinophils Absolute: 303 cells/uL (ref 15–500)
Eosinophils Relative: 3 %
HCT: 51.9 % — ABNORMAL HIGH (ref 38.5–50.0)
Hemoglobin: 17.5 g/dL — ABNORMAL HIGH (ref 13.2–17.1)
Lymphs Abs: 2424 cells/uL (ref 850–3900)
MCH: 28.7 pg (ref 27.0–33.0)
MCHC: 33.7 g/dL (ref 32.0–36.0)
MCV: 85.1 fL (ref 80.0–100.0)
MPV: 10.2 fL (ref 7.5–12.5)
Monocytes Relative: 7.1 %
Neutro Abs: 6525 cells/uL (ref 1500–7800)
Neutrophils Relative %: 64.6 %
Platelets: 358 10*3/uL (ref 140–400)
RBC: 6.1 10*6/uL — ABNORMAL HIGH (ref 4.20–5.80)
RDW: 14.4 % (ref 11.0–15.0)
Total Lymphocyte: 24 %
WBC: 10.1 10*3/uL (ref 3.8–10.8)

## 2019-08-22 LAB — COMPLETE METABOLIC PANEL WITH GFR
AG Ratio: 1.4 (calc) (ref 1.0–2.5)
ALT: 19 U/L (ref 9–46)
AST: 24 U/L (ref 10–35)
Albumin: 4.3 g/dL (ref 3.6–5.1)
Alkaline phosphatase (APISO): 92 U/L (ref 35–144)
BUN: 18 mg/dL (ref 7–25)
CO2: 29 mmol/L (ref 20–32)
Calcium: 9.8 mg/dL (ref 8.6–10.3)
Chloride: 98 mmol/L (ref 98–110)
Creat: 1.02 mg/dL (ref 0.70–1.25)
GFR, Est African American: 92 mL/min/{1.73_m2} (ref >=60)
GFR, Est Non African American: 79 mL/min/{1.73_m2} (ref >=60)
Globulin: 3 g/dL (calc) (ref 1.9–3.7)
Glucose, Bld: 91 mg/dL (ref 65–99)
Potassium: 4.1 mmol/L (ref 3.5–5.3)
Sodium: 136 mmol/L (ref 135–146)
Total Bilirubin: 0.8 mg/dL (ref 0.2–1.2)
Total Protein: 7.3 g/dL (ref 6.1–8.1)

## 2019-08-22 LAB — LIPID PANEL
Cholesterol: 146 mg/dL (ref <200)
HDL: 31 mg/dL — ABNORMAL LOW (ref >=40)
LDL Cholesterol (Calc): 93 mg/dL (calc)
Non-HDL Cholesterol (Calc): 115 mg/dL (calc) (ref <130)
Total CHOL/HDL Ratio: 4.7 (calc) (ref <5.0)
Triglycerides: 119 mg/dL (ref <150)

## 2019-08-22 LAB — TSH: TSH: 4.6 mIU/L — ABNORMAL HIGH (ref 0.40–4.50)

## 2019-08-22 NOTE — Progress Notes (Signed)
Name: PRYNCE JACOBER Sr.   MRN: [id number]    DOB: [birth year]   Date:08/22/2019       Progress Note  Chief Complaint  Patient presents with  . Follow-up  . Hypertension  . Hyperlipidemia  . Hypothyroidism     Subjective:   ANTONY SIAN Sr. is a 59 y.o. male, presents to clinic for routine f/up as noted above He also complains of "crashing in the afternoon" around 2, just fatigued and tired - feels like he has to take a nap Additionally has right sided rib pain when he coughs, no pleuritic CP or SOB.  Cough is chronic, attributed to daily smoking and "smokers cough" no CP, SOB, hemoptysis, non-productive.  Hypertension:  Currently managed on lisinopril-HCTZ 20-12.5 mg  Pt reports good med compliance and denies any SE.  No lightheadedness, hypotension, syncope. Blood pressure today is well controlled. BP Readings from Last 6 Encounters:  08/22/19 118/74  02/24/19 126/68  12/13/18 (!) 153/79  08/15/18 132/74  02/16/18 125/76  01/03/18 136/74  Pt denies CP, SOB, exertional sx, LE edema, palpitation, Ha's, visual disturbances Dietary efforts for BP?  Trying to be healthy  Hyperlipidemia: Currently treated with simvastatin 40 mg daily, pt reports good med compliance Last Lipids: Last lipids at CPE are worse than his prior labs on same med - he denied any non-compliance or diet/lifestyle changes  Previously LDL was 80-90 Lab Results  Component Value Date   CHOL 186 02/24/2019   HDL 37 (L) 02/24/2019   LDLCALC 131 (H) 02/24/2019   TRIG 85 02/24/2019   CHOLHDL 5.0 (H) 02/24/2019  The 10-year ASCVD risk score [name] DC Jr., et al., 2013) is: 16.7%   Values used to calculate the score:     Age: 64 years     Sex: Male     Is Non-Hispanic African American: No     Diabetic: No     Tobacco smoker: Yes     Systolic Blood Pressure: 888 mmHg     Is BP treated: Yes     HDL Cholesterol: 37 mg/dL     Total Cholesterol: 186 mg/dL - Denies: Chest pain, shortness of breath,  myalgias, claudication  Hypothyroidism: Current Medication Regimen:  100 mcg synthroid Takes medicine daily in the am Current Symptoms: fatigue, denies weight or mood changes, denies swelling, change in bowels, hair or skin Most recent results are below; we will be repeating labs today. Lab Results  Component Value Date   TSH 2.40 02/24/2019   Anxiety/depression on paxil: Has been on for a very long time Depression screen [facility] 2/9 08/22/2019 02/24/2019 12/13/2018  Decreased Interest 1 0 1  Down, Depressed, Hopeless 1 1 3   PHQ - 2 Score 2 1 4   Altered sleeping 1 1 1   Tired, decreased energy 1 0 1  Change in appetite 0 0 0  Feeling bad or failure about yourself  1 0 0  Trouble concentrating 0 0 0  Moving slowly or fidgety/restless 0 0 0  Suicidal thoughts 0 0 0  PHQ-9 Score 5 2 6   Difficult doing work/chores Somewhat difficult Not difficult at all Somewhat difficult   Fatigue -  Snores, no past OSA eval    Current Outpatient Medications:  .  levothyroxine (SYNTHROID) 100 MCG tablet, Take 1 tablet (100 mcg total) by mouth daily., Disp: 90 tablet, Rfl: 3 .  lisinopril-hydrochlorothiazide (ZESTORETIC) 20-12.5 MG tablet, Take 1 tablet by mouth daily., Disp: 90 tablet, Rfl: 3 .  PARoxetine (PAXIL) 20  MG tablet, Take 1 tablet (20 mg total) by mouth 2 (two) times daily., Disp: 180 tablet, Rfl: 3 .  simvastatin (ZOCOR) 40 MG tablet, Take 1 tablet (40 mg total) by mouth at bedtime., Disp: 90 tablet, Rfl: 3  Patient Active Problem List   Diagnosis Date Noted  . Ectatic abdominal aorta ([place]) 08/15/2018  . B12 deficiency 09/12/2017  . Overweight (BMI 25.0-29.9) 07/02/2017  . Herpes 01/03/2016  . Umbilical hernia without obstruction and without gangrene 04/22/2015  . Umbilical hernia 44/81/8563  . Scoliosis 01/08/2015  . Spinal stenosis, lumbar 01/08/2015  . DDD (degenerative disc disease), lumbar 01/08/2015  . Agoraphobia with panic attacks 08/24/2014  . AAT (alpha-1-antitrypsin)  deficiency ([place])   . Tobacco use   . Hyperlipidemia   . Hypertension   . Hypothyroidism   . Thyroid cyst   . Anxiety   . Bipolar disorder ([place])   . Vitamin D deficiency disease   . Insomnia   . Secondary erythrocytosis 05/08/2014    Past Surgical History:  Procedure Laterality Date  . COLONOSCOPY  2013   Dr. Orest Dikes 2    Family History  Problem Relation Age of Onset  . Heart disease Father   . Heart failure Father   . Heart attack Father        x 5  . Alcohol abuse Father   . COPD Father   . Dementia Mother   . Hypertension Mother   . Heart attack Brother   . CAD Brother   . Hypertension Brother   . Alcohol abuse Paternal Grandfather   . COPD Maternal Grandfather   . Cancer Neg Hx   . Diabetes Neg Hx   . Stroke Neg Hx     Social History   Tobacco Use  . Smoking status: Current Every Day Smoker    Packs/day: 1.00    Years: 40.00    Pack years: 40.00    Types: Cigarettes  . Smokeless tobacco: Never Used  Vaping Use  . Vaping Use: Never used  Substance Use Topics  . Alcohol use: No  . Drug use: No     Allergies  Allergen Reactions  . No Known Allergies     Health Maintenance  Topic Date Due  . INFLUENZA VACCINE  08/06/2019  . COVID-19 Vaccine (1) 09/07/2019 (Originally 04/03/1972)  . COLONOSCOPY  05/05/2021  . TETANUS/TDAP  01/02/2026  . Hepatitis C Screening  Completed  . HIV Screening  Completed    Chart Review Today: I personally reviewed active problem list, medication list, allergies, family history, social history, health maintenance, notes from last encounter, lab results, imaging with the patient/caregiver today.   Review of Systems  10 Systems reviewed and are negative for acute change except as noted in the HPI.     Objective:   Vitals:   08/22/19 1004  BP: 118/74  Pulse: 68  Resp: 18  Temp: 98.1 F (36.7 C)  TempSrc: Oral  SpO2: 96%  Weight: 207 lb 14.4 oz (94.3 kg)  Height: 5\' 10"  (1.778 m)    Body mass index is  29.83 kg/m.  Physical Exam Vitals and nursing note reviewed.  Constitutional:      General: He is not in acute distress.    Appearance: Normal appearance. He is well-developed. He is not ill-appearing, toxic-appearing or diaphoretic.     Interventions: Face mask in place.  HENT:     Head: Normocephalic and atraumatic.     Jaw: No trismus.  Right Ear: External ear normal.     Left Ear: External ear normal.  Eyes:     General: Lids are normal. No scleral icterus.       Right eye: No discharge.        Left eye: No discharge.     Conjunctiva/sclera: Conjunctivae normal.  Neck:     Trachea: Trachea and phonation normal. No tracheal deviation.  Cardiovascular:     Rate and Rhythm: Normal rate and regular rhythm.     Pulses: Normal pulses.          Radial pulses are 2+ on the right side and 2+ on the left side.       Posterior tibial pulses are 2+ on the right side and 2+ on the left side.     Heart sounds: Normal heart sounds. No murmur heard.  No friction rub. No gallop.   Pulmonary:     Effort: Pulmonary effort is normal. No respiratory distress.     Breath sounds: Normal breath sounds. No stridor. No wheezing, rhonchi or rales.  Chest:     Comments: No chest wall ttp, no deformity, swelling Abdominal:     General: Bowel sounds are normal. There is no distension.     Palpations: Abdomen is soft. There is no mass.     Tenderness: There is no abdominal tenderness.  Musculoskeletal:     Right lower leg: No edema.     Left lower leg: No edema.  Skin:    General: Skin is warm and dry.     Coloration: Skin is not jaundiced.     Findings: No rash.     Nails: There is no clubbing.  Neurological:     Mental Status: He is alert. Mental status is at baseline.     Cranial Nerves: No dysarthria or facial asymmetry.     Motor: No tremor or abnormal muscle tone.     Gait: Gait normal.  Psychiatric:        Mood and Affect: Mood normal.        Speech: Speech normal.         Behavior: Behavior normal. Behavior is cooperative.         Assessment & Plan:     ICD-10-CM   1. Mixed hyperlipidemia  E78.2 Lipid panel    COMPLETE METABOLIC PANEL WITH GFR   increased LDL, recheck labs today, compliant with simvastatin 40 mg, if LDL still elevated - will change statin med and f/up more closely  2. Essential hypertension  X52 COMPLETE METABOLIC PANEL WITH GFR   stable well controlled on lisinopril-HCTZ  3. Other specified hypothyroidism  E03.8 TSH   fatigue - recheck labs today to make sure no chemical hypothyroid   4. Encounter for medication monitoring  Z51.81 TSH    Lipid panel    COMPLETE METABOLIC PANEL WITH GFR    CBC with Differential/Platelet  5. Fatigue, unspecified type  R53.83 TSH    COMPLETE METABOLIC PANEL WITH GFR    CBC with Differential/Platelet   DDx depression? OSA, hypothyroid - no associated cardiopulm sx other than chronic cough, lungs clear, VSS - check labs, possible sleep study?  6. Cough  R05    chronic "smokers cough" offered tessalon perles, declined, encouraged him to use OTC mucinex and/or delsym, f/up if worsening, reduce smoking  7. Current smoker  F17.200    no desire to stop smoking - tobacco counseling and smoking cessation discussed today  8. Secondary erythrocytosis  D75.1  CBC with Differential/Platelet   last H/H high, pt lost to f/up with heme/onc, not going to cancer center/doing phlebotomy - smoking and possibly OSA contributing?  recheck today  9. Overweight (BMI 25.0-29.9)  E66.3   10. Bipolar affective disorder, currently depressed, mild ([place])  F31.31    per chart - on paxil for many years - he reports stable moods/depression  11. Current mild episode of major depressive disorder, unspecified whether recurrent ([place])  F32.0    pt reports stable sx on paxil, slightly more fatigued, phq reviewed - mildly positive - more fatigue and depressive sx   Smoking cessation instruction/counseling given:  counseled patient on the  dangers of tobacco use, advised patient to stop smoking, and reviewed strategies to maximize success   Return in about 6 months (around 02/22/2020) for Routine follow-up.   Delsa Grana, PA-C 08/22/19 10:44 AM

## 2019-08-25 ENCOUNTER — Other Ambulatory Visit: Payer: Self-pay | Admitting: Family Medicine

## 2019-08-25 MED ORDER — LEVOTHYROXINE SODIUM 125 MCG PO TABS: 125.0000 ug | ORAL_TABLET | Freq: Every day | ORAL | 3 refills | Status: DC

## 2019-10-17 ENCOUNTER — Telehealth: Payer: Self-pay

## 2019-10-17 MED ORDER — LEVOTHYROXINE SODIUM 112 MCG PO TABS: 112.0000 ug | ORAL_TABLET | Freq: Every day | ORAL | 3 refills | Status: DC

## 2019-10-17 NOTE — Telephone Encounter (Signed)
Copied from [place] [id number]. Topic: General - Other >> Oct 17, 2019 11:55 AM Tyler Cobb wrote: Reason for CRM: Patient called to inform Tyler Cobb the since the increase in his levothyroxine (SYNTHROID) 125 MCG tablet he is having rapid heart beat, anxiety, hot flashes and just not feeling very well needing Cobb call back to let him know if he should take the medication because he may stop due to the effects its having Ph# [phone]

## 2019-10-18 NOTE — Telephone Encounter (Signed)
Pt notified, has appt friday

## 2019-10-20 ENCOUNTER — Ambulatory Visit: Payer: Medicaid Other | Admitting: Family Medicine

## 2019-10-20 ENCOUNTER — Encounter: Payer: Self-pay | Admitting: Family Medicine

## 2019-10-20 ENCOUNTER — Other Ambulatory Visit: Payer: Self-pay

## 2019-10-20 VITALS — BP 124/72 | HR 72 | Temp 98.3°F | Resp 18 | Ht 70.0 in | Wt 206.5 lb

## 2019-10-20 DIAGNOSIS — R002 Palpitations: Secondary | ICD-10-CM | POA: Diagnosis not present

## 2019-10-20 DIAGNOSIS — E038 Other specified hypothyroidism: Secondary | ICD-10-CM

## 2019-10-20 MED ORDER — LEVOTHYROXINE SODIUM 100 MCG PO TABS: 100.0000 ug | ORAL_TABLET | Freq: Every day | ORAL | 0 refills | Status: DC

## 2019-10-20 MED ORDER — LEVOTHYROXINE SODIUM 112 MCG PO TABS: 112.0000 ug | ORAL_TABLET | Freq: Every day | ORAL | 3 refills | Status: DC

## 2019-10-20 NOTE — Progress Notes (Signed)
Patient ID: Tyler Cunas., male    DOB: Dec 17, 1960, 59 y.o.   MRN: [id number]  PCP: Delsa Grana, PA-C  Chief Complaint  Patient presents with  . Hypothyroidism    discuss symptoms after medication was increased  . Anxiety    Subjective:   Tyler Yoo. is a 59 y.o. male, presents to clinic with CC of the following:  HPI  Pt presents with symptoms roughly 6 weeks after increasing levothyroxine dose from 100 mcg to 125 micrograms after TSH was elevated at August 17th appt. dose increase about August 20, patient felt symptomatic about 2 weeks ago had palpitations, felt anxious had some hot flashes. In the last week he called our office with the symptoms and was told to resume his prior dose, he has had symptoms resolve in the last 2 days and is taking 100 mcg. Lab Results  Component Value Date   TSH 4.60 (H) 08/22/2019   Several weeks ago a working outside in the heat he did have some funny symptoms in his chest associated with his palpitations.  He denies any shortness of breath, near syncope, change in sweats reports that he always has diaphoresis and this has not changed at all, he denies any change to his bowels hair or skin.  He felt anxious when his heart rate was little faster when he was experiencing palpitations but otherwise his mood is unchanged.  Patient Active Problem List   Diagnosis Date Noted  . Ectatic abdominal aorta ([place]) 08/15/2018  . B12 deficiency 09/12/2017  . Overweight (BMI 25.0-29.9) 07/02/2017  . Herpes 01/03/2016  . Umbilical hernia without obstruction and without gangrene 04/22/2015  . Umbilical hernia 18/84/1660  . Scoliosis 01/08/2015  . Spinal stenosis, lumbar 01/08/2015  . DDD (degenerative disc disease), lumbar 01/08/2015  . Agoraphobia with panic attacks 08/24/2014  . AAT (alpha-1-antitrypsin) deficiency ([place])   . Tobacco use   . Hyperlipidemia   . Hypertension   . Hypothyroidism   . Thyroid cyst   . Anxiety   . Bipolar  disorder ([place])   . Vitamin D deficiency disease   . Insomnia   . Secondary erythrocytosis 05/08/2014      Current Outpatient Medications:  .  levothyroxine (SYNTHROID) 112 MCG tablet, Take 1 tablet (112 mcg total) by mouth daily., Disp: 90 tablet, Rfl: 3 .  lisinopril-hydrochlorothiazide (ZESTORETIC) 20-12.5 MG tablet, Take 1 tablet by mouth daily., Disp: 90 tablet, Rfl: 3 .  PARoxetine (PAXIL) 20 MG tablet, Take 1 tablet (20 mg total) by mouth 2 (two) times daily., Disp: 180 tablet, Rfl: 3 .  simvastatin (ZOCOR) 40 MG tablet, Take 1 tablet (40 mg total) by mouth at bedtime., Disp: 90 tablet, Rfl: 3   Allergies  Allergen Reactions  . No Known Allergies      Social History   Tobacco Use  . Smoking status: Current Every Day Smoker    Packs/day: 1.00    Years: 40.00    Pack years: 40.00    Types: Cigarettes  . Smokeless tobacco: Never Used  Vaping Use  . Vaping Use: Never used  Substance Use Topics  . Alcohol use: No  . Drug use: No      Chart Review Today: I personally reviewed active problem list, medication list, allergies, family history, social history, health maintenance, notes from last encounter, lab results, imaging with the patient/caregiver today.   Review of Systems 10 Systems reviewed and are negative for acute change except as noted in the  HPI.    Objective:   Vitals:   10/20/19 1147  BP: 124/72  Pulse: 94  Resp: 18  Temp: 98.3 F (36.8 C)  TempSrc: Oral  SpO2: 98%  Weight: 206 lb 8 oz (93.7 kg)  Height: 5\' 10"  (1.778 m)    Body mass index is 29.63 kg/m.  Physical Exam Vitals and nursing note reviewed.  Constitutional:      General: He is not in acute distress.    Appearance: Normal appearance. He is obese. He is not ill-appearing, toxic-appearing or diaphoretic.  HENT:     Head: Normocephalic and atraumatic.     Right Ear: External ear normal.     Left Ear: External ear normal.  Eyes:     General: No scleral icterus.       Right  eye: No discharge.        Left eye: No discharge.     Conjunctiva/sclera: Conjunctivae normal.  Cardiovascular:     Rate and Rhythm: Normal rate and regular rhythm.     Pulses: Normal pulses.     Heart sounds: Normal heart sounds. No murmur heard.  No friction rub. No gallop.   Pulmonary:     Effort: Pulmonary effort is normal. No respiratory distress.     Breath sounds: Normal breath sounds. No wheezing or rales.  Abdominal:     General: Bowel sounds are normal. There is no distension.     Palpations: Abdomen is soft.     Tenderness: There is no abdominal tenderness.  Musculoskeletal:     Right lower leg: No edema.     Left lower leg: No edema.  Skin:    Capillary Refill: Capillary refill takes less than 2 seconds.  Neurological:     Mental Status: He is alert.  Psychiatric:        Mood and Affect: Mood normal.        Behavior: Behavior normal.      Results for orders placed or performed in visit on 08/22/19  TSH  Result Value Ref Range   TSH 4.60 (H) 0.40 - 4.50 mIU/L  Lipid panel  Result Value Ref Range   Cholesterol 146 <200 mg/dL   HDL 31 (L) > OR = 40 mg/dL   Triglycerides 119 <150 mg/dL   LDL Cholesterol (Calc) 93 mg/dL (calc)   Total CHOL/HDL Ratio 4.7 <5.0 (calc)   Non-HDL Cholesterol (Calc) 115 <130 mg/dL (calc)  COMPLETE METABOLIC PANEL WITH GFR  Result Value Ref Range   Glucose, Bld 91 65 - 99 mg/dL   BUN 18 7 - 25 mg/dL   Creat 1.02 0.70 - 1.25 mg/dL   GFR, Est Non African American 79 > OR = 60 mL/min/1.1m2   GFR, Est African American 92 > OR = 60 mL/min/1.20m2   BUN/Creatinine Ratio NOT APPLICABLE 6 - 22 (calc)   Sodium 136 135 - 146 mmol/L   Potassium 4.1 3.5 - 5.3 mmol/L   Chloride 98 98 - 110 mmol/L   CO2 29 20 - 32 mmol/L   Calcium 9.8 8.6 - 10.3 mg/dL   Total Protein 7.3 6.1 - 8.1 g/dL   Albumin 4.3 3.6 - 5.1 g/dL   Globulin 3.0 1.9 - 3.7 g/dL (calc)   AG Ratio 1.4 1.0 - 2.5 (calc)   Total Bilirubin 0.8 0.2 - 1.2 mg/dL   Alkaline  phosphatase (APISO) 92 35 - 144 U/L   AST 24 10 - 35 U/L   ALT 19 9 - 46 U/L  CBC  with Differential/Platelet  Result Value Ref Range   WBC 10.1 3.8 - 10.8 Thousand/uL   RBC 6.10 (H) 4.20 - 5.80 Million/uL   Hemoglobin 17.5 (H) 13.2 - 17.1 g/dL   HCT 51.9 (H) 38 - 50 %   MCV 85.1 80.0 - 100.0 fL   MCH 28.7 27.0 - 33.0 pg   MCHC 33.7 32.0 - 36.0 g/dL   RDW 14.4 11.0 - 15.0 %   Platelets 358 140 - 400 Thousand/uL   MPV 10.2 7.5 - 12.5 fL   Neutro Abs 6,525 1,500 - 7,800 cells/uL   Lymphs Abs 2,424 850 - 3,900 cells/uL   Absolute Monocytes 717 200 - 950 cells/uL   Eosinophils Absolute 303 15.0 - 500.0 cells/uL   Basophils Absolute 131 0.0 - 200.0 cells/uL   Neutrophils Relative % 64.6 %   Total Lymphocyte 24.0 %   Monocytes Relative 7.1 %   Eosinophils Relative 3.0 %   Basophils Relative 1.3 %   ECG interpretation   Date: 10/20/19  Rate: 69  Rhythm: sinus arrhythmia  QRS Axis: right axis (mild)  Intervals: normal  ST/T Wave abnormalities: normal- no ST elevation or depression  Narrative Interpretation:  Sinus arrhythmia, right axis deviation, possible anterior infarct, right ventricular hypertrophy   Old EKG Reviewed: no past EKGs available       Assessment & Plan:     ICD-10-CM   1. Other specified hypothyroidism  E03.8 TSH  2. Palpitations  R00.2 EKG 12-Lead   Reduce thyroid med dose to 112, can further decrease back to 100 if still having sx, recheck TSH today, close f/up, pt sx resolved 2 days ago, HR WNL, cardiac exam today reassuring RRR no MGR. EKG shows no concerning findings related to HR, conduction, arrhythmia or ACS If pt has any reoccurance of palpitations or any CP will refer to cardiology, but pt sx have resolved.        Delsa Grana, PA-C 10/20/19 12:04 PM

## 2019-10-21 LAB — TSH: TSH: 0.3 mIU/L — ABNORMAL LOW (ref 0.40–4.50)

## 2019-10-24 ENCOUNTER — Encounter: Payer: Self-pay | Admitting: Emergency Medicine

## 2019-11-27 ENCOUNTER — Ambulatory Visit (INDEPENDENT_AMBULATORY_CARE_PROVIDER_SITE_OTHER): Payer: Medicaid Other | Admitting: Family Medicine

## 2019-11-27 ENCOUNTER — Other Ambulatory Visit: Payer: Self-pay

## 2019-11-27 ENCOUNTER — Encounter: Payer: Self-pay | Admitting: Family Medicine

## 2019-11-27 VITALS — BP 126/74 | HR 92 | Temp 98.0°F | Resp 18 | Ht 70.0 in | Wt 206.9 lb

## 2019-11-27 DIAGNOSIS — E038 Other specified hypothyroidism: Secondary | ICD-10-CM | POA: Diagnosis not present

## 2019-11-27 LAB — TSH: TSH: 0.82 mIU/L (ref 0.40–4.50)

## 2019-11-27 NOTE — Progress Notes (Signed)
Patient ID: Tyler Cobb., male    DOB: February 18, 1960, 59 y.o.   MRN: [id number]  PCP: Delsa Grana, PA-C  Chief Complaint  Patient presents with   Hypothyroidism    Subjective:   Tyler STEELMAN Sr. is a 59 y.o. male, presents to clinic with CC of the following:  HPI  Here for recheck of thyroid labs and VS  Dose increase had caused rapid heart rate, some sweats and palpitations His dose was adjusted and he came in shortly afterwards, sx had been improving He is here to recheck labs   Sx and SE are mostly improved - vague complaints today - he feels weird and is irritable - but anxiety and moods are not new  BP Readings from Last 3 Encounters:  11/27/19 126/74  10/20/19 124/72  08/22/19 118/74    Pulse Readings from Last 3 Encounters:  11/27/19 92  10/20/19 72  08/22/19 68   Currently on 112 mcg dose 100 mg dose pt had high TSH Increased previously to 125 mcg and he was symptomatic Now on 112 mcg dose for 6+ weeks Lab Results  Component Value Date   TSH 0.30 (L) 10/20/2019      Patient Active Problem List   Diagnosis Date Noted   Ectatic abdominal aorta ([place]) 08/15/2018   B12 deficiency 09/12/2017   Overweight (BMI 25.0-29.9) 07/02/2017   Herpes 34/19/3790   Umbilical hernia without obstruction and without gangrene 24/09/7351   Umbilical hernia 29/92/4268   Scoliosis 01/08/2015   Spinal stenosis, lumbar 01/08/2015   DDD (degenerative disc disease), lumbar 01/08/2015   Agoraphobia with panic attacks 08/24/2014   AAT (alpha-1-antitrypsin) deficiency ([place])    Tobacco use    Hyperlipidemia    Hypertension    Hypothyroidism    Thyroid cyst    Anxiety    Bipolar disorder ([place])    Vitamin D deficiency disease    Insomnia    Secondary erythrocytosis 05/08/2014      Current Outpatient Medications:    levothyroxine (SYNTHROID) 100 MCG tablet, Take 1 tablet (100 mcg total) by mouth daily., Disp: 30 tablet, Rfl: 0    lisinopril-hydrochlorothiazide (ZESTORETIC) 20-12.5 MG tablet, Take 1 tablet by mouth daily., Disp: 90 tablet, Rfl: 3   PARoxetine (PAXIL) 20 MG tablet, Take 1 tablet (20 mg total) by mouth 2 (two) times daily., Disp: 180 tablet, Rfl: 3   simvastatin (ZOCOR) 40 MG tablet, Take 1 tablet (40 mg total) by mouth at bedtime., Disp: 90 tablet, Rfl: 3   levothyroxine (SYNTHROID) 112 MCG tablet, Take 1 tablet (112 mcg total) by mouth daily. (Patient not taking: Reported on 11/27/2019), Disp: 90 tablet, Rfl: 3   Allergies  Allergen Reactions   No Known Allergies      Social History   Tobacco Use   Smoking status: Current Every Day Smoker    Packs/day: 1.00    Years: 40.00    Pack years: 40.00    Types: Cigarettes   Smokeless tobacco: Never Used  Vaping Use   Vaping Use: Never used  Substance Use Topics   Alcohol use: No   Drug use: No      Chart Review Today: I personally reviewed active problem list, medication list, allergies, family history, social history, health maintenance, notes from last encounter, lab results, imaging with the patient/caregiver today.   Review of Systems 10 Systems reviewed and are negative for acute change except as noted in the HPI.     Objective:   Vitals:  11/27/19 1157  BP: 126/74  Pulse: 92  Resp: 18  Temp: 98 F (36.7 C)  SpO2: 95%  Weight: 206 lb 14.4 oz (93.8 kg)  Height: 5\' 10"  (1.778 m)    Body mass index is 29.69 kg/m.  Physical Exam Vitals and nursing note reviewed.  Constitutional:      General: He is not in acute distress.    Appearance: Normal appearance. He is well-developed. He is not ill-appearing, toxic-appearing or diaphoretic.     Interventions: Face mask in place.  HENT:     Head: Normocephalic and atraumatic.     Jaw: No trismus.     Right Ear: External ear normal.     Left Ear: External ear normal.  Eyes:     General: Lids are normal. No scleral icterus.       Right eye: No discharge.        Left  eye: No discharge.     Conjunctiva/sclera: Conjunctivae normal.  Neck:     Trachea: Trachea and phonation normal. No tracheal deviation.  Cardiovascular:     Rate and Rhythm: Normal rate and regular rhythm.     Pulses: Normal pulses.          Radial pulses are 2+ on the right side and 2+ on the left side.       Posterior tibial pulses are 2+ on the right side and 2+ on the left side.     Heart sounds: Normal heart sounds. No murmur heard.  No friction rub. No gallop.   Pulmonary:     Effort: Pulmonary effort is normal. No respiratory distress.     Breath sounds: Normal breath sounds. No stridor. No wheezing, rhonchi or rales.  Abdominal:     General: Bowel sounds are normal. There is no distension.     Palpations: Abdomen is soft.  Musculoskeletal:     Right lower leg: No edema.     Left lower leg: No edema.  Skin:    General: Skin is warm and dry.     Coloration: Skin is not jaundiced.     Findings: No rash.     Nails: There is no clubbing.  Neurological:     Mental Status: He is alert. Mental status is at baseline.     Cranial Nerves: No dysarthria or facial asymmetry.     Motor: No tremor or abnormal muscle tone.     Gait: Gait normal.  Psychiatric:        Attention and Perception: Attention normal.        Mood and Affect: Mood is anxious and depressed. Affect is flat.        Speech: Speech normal.      Results for orders placed or performed in visit on 10/20/19  TSH  Result Value Ref Range   TSH 0.30 (L) 0.40 - 4.50 mIU/L       Assessment & Plan:     ICD-10-CM   1. Other specified hypothyroidism  E03.8 TSH   112 mcg dose - pending labs, pt feels overall still better than with 125 mcg dose VSS, weight stable Continue same meds  Pt mood flat, he mentioned that he was irritable and arguing with the neighbors and his son said his anxiety was worse but pt does not want to treat it with meds and doesn't want referrals  HR at the time of exam was 72 RRR, no Benedetto Goad, [place] 11/27/19 12:41  PM

## 2019-11-29 ENCOUNTER — Encounter: Payer: Self-pay | Admitting: Emergency Medicine

## 2019-12-06 ENCOUNTER — Other Ambulatory Visit: Payer: Self-pay

## 2019-12-06 NOTE — Patient Instructions (Signed)
Patient was referred to the Managed Medicaid Care Team by his [facility] insurance plan, [facility]. A review of patient's chart and most recent visit with PCP indicates that patient prefers to handle his symptom management on his own. No services are needed at this time.  Netta Neat, BSW, MSW, LCSW Social Work Case Freight forwarder - [place]  [employer] [place]: [phone]

## 2019-12-06 NOTE — Patient Outreach (Addendum)
Care Coordination  12/06/2019  [name] [birth year] [id number]  Patient was referred to the Managed Medicaid Care Team by his [facility] insurance plan, [facility]. A review of patient's chart and most recent visit with PCP indicates that patient prefers to handle his symptom management on his own. No services are needed at this time.   Netta Neat, BSW, MSW, LCSW Social Work Case Freight forwarder - [place]  Direct [place]: [phone]

## 2020-01-06 DIAGNOSIS — Z419 Encounter for procedure for purposes other than remedying health state, unspecified: Secondary | ICD-10-CM | POA: Diagnosis not present

## 2020-01-19 ENCOUNTER — Other Ambulatory Visit: Payer: Self-pay | Admitting: Family Medicine

## 2020-01-19 DIAGNOSIS — F3131 Bipolar disorder, current episode depressed, mild: Secondary | ICD-10-CM

## 2020-02-06 DIAGNOSIS — Z419 Encounter for procedure for purposes other than remedying health state, unspecified: Secondary | ICD-10-CM | POA: Diagnosis not present

## 2020-02-22 ENCOUNTER — Ambulatory Visit: Payer: Medicaid Other | Admitting: Family Medicine

## 2020-03-05 DIAGNOSIS — Z419 Encounter for procedure for purposes other than remedying health state, unspecified: Secondary | ICD-10-CM | POA: Diagnosis not present

## 2020-03-10 ENCOUNTER — Other Ambulatory Visit: Payer: Self-pay | Admitting: Family Medicine

## 2020-03-10 DIAGNOSIS — I10 Essential (primary) hypertension: Secondary | ICD-10-CM

## 2020-03-10 DIAGNOSIS — E782 Mixed hyperlipidemia: Secondary | ICD-10-CM

## 2020-03-26 ENCOUNTER — Encounter: Payer: Self-pay | Admitting: Family Medicine

## 2020-03-26 ENCOUNTER — Other Ambulatory Visit: Payer: Self-pay

## 2020-03-26 ENCOUNTER — Ambulatory Visit (INDEPENDENT_AMBULATORY_CARE_PROVIDER_SITE_OTHER): Payer: Medicaid Other | Admitting: Family Medicine

## 2020-03-26 VITALS — BP 132/78 | HR 94 | Temp 98.2°F | Resp 16 | Ht 70.0 in | Wt 207.8 lb

## 2020-03-26 DIAGNOSIS — Z5181 Encounter for therapeutic drug level monitoring: Secondary | ICD-10-CM | POA: Diagnosis not present

## 2020-03-26 DIAGNOSIS — E038 Other specified hypothyroidism: Secondary | ICD-10-CM | POA: Diagnosis not present

## 2020-03-26 DIAGNOSIS — E782 Mixed hyperlipidemia: Secondary | ICD-10-CM | POA: Diagnosis not present

## 2020-03-26 DIAGNOSIS — F3131 Bipolar disorder, current episode depressed, mild: Secondary | ICD-10-CM | POA: Diagnosis not present

## 2020-03-26 DIAGNOSIS — R7303 Prediabetes: Secondary | ICD-10-CM

## 2020-03-26 DIAGNOSIS — F17200 Nicotine dependence, unspecified, uncomplicated: Secondary | ICD-10-CM

## 2020-03-26 DIAGNOSIS — I10 Essential (primary) hypertension: Secondary | ICD-10-CM | POA: Diagnosis not present

## 2020-03-26 DIAGNOSIS — F320 Major depressive disorder, single episode, mild: Secondary | ICD-10-CM

## 2020-03-26 NOTE — Progress Notes (Signed)
Name: Tyler ALPHIN Sr.   MRN: [id number]    DOB: 1960-03-29   Date:03/26/2020       Progress Note  Chief Complaint  Patient presents with  . Follow-up  . Hypothyroidism  . Hypertension  . Hyperlipidemia     Subjective:   Tyler Cobb. is a 60 y.o. male, presents to clinic for   Hypertension:  Currently managed on lisinopril HCTZ 20-12 0.5 Pt reports good med compliance and denies any SE.   Blood pressure today is fairly well controlled. BP Readings from Last 3 Encounters:  03/26/20 132/78  11/27/19 126/74  10/20/19 124/72   Pt denies CP, SOB, exertional sx, LE edema, palpitation, Ha's, visual disturbances, lightheadedness, hypotension, syncope. Dietary efforts for BP?  None  Hypothyroidism: On levothyroxine 112 mcg daily, was hypothyroid a few months ago, dose increase was too high at 125 mcg, tsh went low, then dose was decreased to 112, last TSH was normal, will recheck today due to several sx and overlaping severe anxiety and depression, weight stable Wt Readings from Last 5 Encounters:  03/26/20 207 lb 12.8 oz (94.3 kg)  11/27/19 206 lb 14.4 oz (93.8 kg)  10/20/19 206 lb 8 oz (93.7 kg)  08/22/19 207 lb 14.4 oz (94.3 kg)  02/24/19 205 lb 4.8 oz (93.1 kg)   BMI Readings from Last 5 Encounters:  03/26/20 29.82 kg/m  11/27/19 29.69 kg/m  10/20/19 29.63 kg/m  08/22/19 29.83 kg/m  02/24/19 29.46 kg/m    Current Symptoms: denies fatigue, weight changes, heat/cold intolerance, bowel/skin changes or CVS symptoms Most recent results are below; we will be repeating labs today. Lab Results  Component Value Date   TSH 0.82 11/27/2019   Anxiety/depression Depression screen [facility] 2/9 03/26/2020 11/27/2019 11/27/2019  Decreased Interest 3 3 3   Down, Depressed, Hopeless 3 3 3   PHQ - 2 Score 6 6 6   Altered sleeping 2 - -  Tired, decreased energy 3 - -  Change in appetite 2 - -  Feeling bad or failure about yourself  0 - -  Trouble concentrating 1 - -   Moving slowly or fidgety/restless 0 - -  Suicidal thoughts 0 - -  PHQ-9 Score 14 - -  Difficult doing work/chores Somewhat difficult - -  Some recent data might be hidden   GAD 7 : Generalized Anxiety Score 03/26/2020 10/20/2019 08/22/2019 01/08/2015  Nervous, Anxious, on Edge 3 3 1 3   Control/stop worrying 3 3 0 3  Worry too much - different things 3 3 1 3   Trouble relaxing 2 1 0 1  Restless 0 0 0 0  Easily annoyed or irritable 1 2 0 1  Afraid - awful might happen 0 0 0 0  Total GAD 7 Score 12 12 2 11   Anxiety Difficulty Somewhat difficult Somewhat difficult Not difficult at all Very difficult   On paxil 20 mg BID  HLD on simvastatin 40 mg last lipids well controlled Lab Results  Component Value Date   CHOL 146 08/22/2019   HDL 31 (L) 08/22/2019   LDLCALC 93 08/22/2019   TRIG 119 08/22/2019   CHOLHDL 4.7 08/22/2019    Current smoker - declines smoking cessation and low dose CT screening for lung CA Smoking more than 1 ppd currently, chronic cough, denies CP, SOB, unintentional weight loss, hematemesis     Current Outpatient Medications:  .  levothyroxine (SYNTHROID) 112 MCG tablet, Take 1 tablet (112 mcg total) by mouth daily., Disp: 90 tablet, Rfl: 3 .  lisinopril-hydrochlorothiazide (ZESTORETIC) 20-12.5 MG tablet, TAKE 1 TABLET BY MOUTH EVERY DAY, Disp: 90 tablet, Rfl: 0 .  PARoxetine (PAXIL) 20 MG tablet, TAKE 1 TABLET BY MOUTH TWICE A DAY, Disp: 180 tablet, Rfl: 1 .  simvastatin (ZOCOR) 40 MG tablet, TAKE 1 TABLET BY MOUTH EVERYDAY AT BEDTIME, Disp: 90 tablet, Rfl: 0  Patient Active Problem List   Diagnosis Date Noted  . Ectatic abdominal aorta ([place]) 08/15/2018  . B12 deficiency 09/12/2017  . Overweight (BMI 25.0-29.9) 07/02/2017  . Herpes 01/03/2016  . Umbilical hernia without obstruction and without gangrene 04/22/2015  . Umbilical hernia 82/99/3716  . Scoliosis 01/08/2015  . Spinal stenosis, lumbar 01/08/2015  . DDD (degenerative disc disease), lumbar  01/08/2015  . Agoraphobia with panic attacks 08/24/2014  . AAT (alpha-1-antitrypsin) deficiency ([place])   . Tobacco use   . Hyperlipidemia   . Hypertension   . Hypothyroidism   . Thyroid cyst   . Anxiety   . Bipolar disorder ([place])   . Vitamin D deficiency disease   . Insomnia   . Secondary erythrocytosis 05/08/2014    Past Surgical History:  Procedure Laterality Date  . COLONOSCOPY  2013   Dr. Orest Dikes 2    Family History  Problem Relation Age of Onset  . Heart disease Father   . Heart failure Father   . Heart attack Father        x 5  . Alcohol abuse Father   . COPD Father   . Dementia Mother   . Hypertension Mother   . Heart attack Brother   . CAD Brother   . Hypertension Brother   . Alcohol abuse Paternal Grandfather   . COPD Maternal Grandfather   . Cancer Neg Hx   . Diabetes Neg Hx   . Stroke Neg Hx     Social History   Tobacco Use  . Smoking status: Current Every Day Smoker    Packs/day: 1.00    Years: 40.00    Pack years: 40.00    Types: Cigarettes  . Smokeless tobacco: Never Used  Vaping Use  . Vaping Use: Never used  Substance Use Topics  . Alcohol use: No  . Drug use: No     Allergies  Allergen Reactions  . No Known Allergies     Health Maintenance  Topic Date Due  . COVID-19 Vaccine (1) 04/11/2020 (Originally 04/03/1965)  . INFLUENZA VACCINE  05/12/2020 (Originally 08/06/2019)  . COLONOSCOPY (Pts 45-73yrs Insurance coverage will need to be confirmed)  05/05/2021  . TETANUS/TDAP  01/02/2026  . Hepatitis C Screening  Completed  . HIV Screening  Completed  . HPV VACCINES  Aged Out    Chart Review Today: I personally reviewed active problem list, medication list, allergies, family history, social history, health maintenance, notes from last encounter, lab results, imaging with the patient/caregiver today.   Review of Systems  10 Systems reviewed and are negative for acute change except as noted in the HPI.  Objective:   Vitals:    03/26/20 0913  BP: 132/78  Pulse: 94  Resp: 16  Temp: 98.2 F (36.8 C)  SpO2: 94%  Weight: 207 lb 12.8 oz (94.3 kg)  Height: 5\' 10"  (1.778 m)    Body mass index is 29.82 kg/m.  Physical Exam Vitals and nursing note reviewed.  Constitutional:      General: He is not in acute distress.    Appearance: Normal appearance. He is well-developed. He is not ill-appearing, toxic-appearing or diaphoretic.  Interventions: Face mask in place.  HENT:     Head: Normocephalic and atraumatic.     Jaw: No trismus.     Right Ear: External ear normal.     Left Ear: External ear normal.  Eyes:     General: Lids are normal. No scleral icterus.       Right eye: No discharge.        Left eye: No discharge.     Conjunctiva/sclera: Conjunctivae normal.  Neck:     Trachea: Trachea and phonation normal. No tracheal deviation.  Cardiovascular:     Rate and Rhythm: Normal rate and regular rhythm.     Pulses: Normal pulses.          Radial pulses are 2+ on the right side and 2+ on the left side.       Posterior tibial pulses are 2+ on the right side and 2+ on the left side.     Heart sounds: Normal heart sounds. No murmur heard. No friction rub. No gallop.   Pulmonary:     Effort: Pulmonary effort is normal. No respiratory distress.     Breath sounds: Normal breath sounds. No stridor. No wheezing, rhonchi or rales.  Abdominal:     General: Bowel sounds are normal. There is no distension.     Palpations: Abdomen is soft.  Musculoskeletal:     Right lower leg: No edema.     Left lower leg: No edema.  Skin:    General: Skin is warm and dry.     Coloration: Skin is not jaundiced.     Findings: No rash.     Nails: There is no clubbing.  Neurological:     Mental Status: He is alert. Mental status is at baseline.     Cranial Nerves: No dysarthria or facial asymmetry.     Motor: No tremor or abnormal muscle tone.     Gait: Gait normal.  Psychiatric:        Attention and Perception: Attention  normal.        Mood and Affect: Mood is depressed (baseline).        Speech: Speech normal.        Behavior: Behavior normal. Behavior is cooperative.        Thought Content: Thought content does not include homicidal or suicidal ideation. Thought content does not include homicidal or suicidal plan.         Assessment & Plan:     ICD-10-CM   1. Other specified hypothyroidism  E03.8 TSH   125 mcg dose was too high and 100 mcg dose was too low recheck TSH with 112  2. Mixed hyperlipidemia  B14.7 COMPLETE METABOLIC PANEL WITH GFR   Compliant with statin, monitoring CMP/LFTs, last lipid panel reviewed  3. Essential hypertension  W29 COMPLETE METABOLIC PANEL WITH GFR   Blood pressure stable, well-controlled, at goal today  4. Encounter for medication monitoring  F62.13 COMPLETE METABOLIC PANEL WITH GFR    TSH    Hemoglobin A1c  5. Prediabetes  Y86.57 COMPLETE METABOLIC PANEL WITH GFR    Hemoglobin A1c   Recheck  6. Bipolar affective disorder, currently depressed, mild ([place])  F31.31 Ambulatory referral to Psychiatry   Baseline mood tends to be slightly depressed and muted affect, PHQ-9 score is positive but he is seeing a specialist  7. Current mild episode of major depressive disorder, unspecified whether recurrent ([place])  F32.0 Ambulatory referral to Psychiatry   Per specialist   8.  Current smoker  F17.200      Return in about 6 months (around 09/26/2020) for Routine follow-up.   Delsa Grana, PA-C 03/26/20 9:35 AM

## 2020-03-27 LAB — COMPLETE METABOLIC PANEL WITH GFR
AG Ratio: 1.3 (calc) (ref 1.0–2.5)
ALT: 19 U/L (ref 9–46)
AST: 21 U/L (ref 10–35)
Albumin: 4.4 g/dL (ref 3.6–5.1)
Alkaline phosphatase (APISO): 102 U/L (ref 35–144)
BUN: 13 mg/dL (ref 7–25)
CO2: 33 mmol/L — ABNORMAL HIGH (ref 20–32)
Calcium: 9.8 mg/dL (ref 8.6–10.3)
Chloride: 101 mmol/L (ref 98–110)
Creat: 0.96 mg/dL (ref 0.70–1.25)
GFR, Est African American: 98 mL/min/{1.73_m2} (ref >=60)
GFR, Est Non African American: 85 mL/min/{1.73_m2} (ref >=60)
Globulin: 3.3 g/dL (calc) (ref 1.9–3.7)
Glucose, Bld: 100 mg/dL — ABNORMAL HIGH (ref 65–99)
Potassium: 4.7 mmol/L (ref 3.5–5.3)
Sodium: 140 mmol/L (ref 135–146)
Total Bilirubin: 0.6 mg/dL (ref 0.2–1.2)
Total Protein: 7.7 g/dL (ref 6.1–8.1)

## 2020-03-27 LAB — HEMOGLOBIN A1C
Hgb A1c MFr Bld: 5.8 % of total Hgb — ABNORMAL HIGH (ref <5.7)
Mean Plasma Glucose: 120 mg/dL
eAG (mmol/L): 6.6 mmol/L

## 2020-03-27 LAB — TSH: TSH: 0.75 mIU/L (ref 0.40–4.50)

## 2020-04-05 DIAGNOSIS — Z419 Encounter for procedure for purposes other than remedying health state, unspecified: Secondary | ICD-10-CM | POA: Diagnosis not present

## 2020-05-02 ENCOUNTER — Other Ambulatory Visit: Payer: Self-pay | Admitting: Family Medicine

## 2020-05-02 DIAGNOSIS — I10 Essential (primary) hypertension: Secondary | ICD-10-CM

## 2020-05-05 DIAGNOSIS — Z419 Encounter for procedure for purposes other than remedying health state, unspecified: Secondary | ICD-10-CM | POA: Diagnosis not present

## 2020-06-05 DIAGNOSIS — Z419 Encounter for procedure for purposes other than remedying health state, unspecified: Secondary | ICD-10-CM | POA: Diagnosis not present

## 2020-06-24 ENCOUNTER — Telehealth: Payer: Self-pay | Admitting: Family Medicine

## 2020-06-24 DIAGNOSIS — E782 Mixed hyperlipidemia: Secondary | ICD-10-CM

## 2020-06-24 NOTE — Telephone Encounter (Signed)
Appt sch'd with Leisa for 8.1.2022

## 2020-07-05 DIAGNOSIS — Z419 Encounter for procedure for purposes other than remedying health state, unspecified: Secondary | ICD-10-CM | POA: Diagnosis not present

## 2020-08-05 ENCOUNTER — Encounter: Payer: Self-pay | Admitting: Family Medicine

## 2020-08-05 ENCOUNTER — Other Ambulatory Visit: Payer: Self-pay

## 2020-08-05 ENCOUNTER — Ambulatory Visit: Payer: Medicaid Other | Admitting: Family Medicine

## 2020-08-05 ENCOUNTER — Encounter: Payer: Self-pay | Admitting: Internal Medicine

## 2020-08-05 VITALS — BP 116/64 | HR 73 | Temp 98.0°F | Resp 16 | Ht 70.0 in | Wt 203.5 lb

## 2020-08-05 DIAGNOSIS — D751 Secondary polycythemia: Secondary | ICD-10-CM

## 2020-08-05 DIAGNOSIS — Z720 Tobacco use: Secondary | ICD-10-CM

## 2020-08-05 DIAGNOSIS — E559 Vitamin D deficiency, unspecified: Secondary | ICD-10-CM | POA: Diagnosis not present

## 2020-08-05 DIAGNOSIS — E039 Hypothyroidism, unspecified: Secondary | ICD-10-CM | POA: Diagnosis not present

## 2020-08-05 DIAGNOSIS — Z419 Encounter for procedure for purposes other than remedying health state, unspecified: Secondary | ICD-10-CM | POA: Diagnosis not present

## 2020-08-05 DIAGNOSIS — E538 Deficiency of other specified B group vitamins: Secondary | ICD-10-CM | POA: Diagnosis not present

## 2020-08-05 DIAGNOSIS — I10 Essential (primary) hypertension: Secondary | ICD-10-CM | POA: Diagnosis not present

## 2020-08-05 DIAGNOSIS — E782 Mixed hyperlipidemia: Secondary | ICD-10-CM | POA: Diagnosis not present

## 2020-08-05 DIAGNOSIS — F4001 Agoraphobia with panic disorder: Secondary | ICD-10-CM | POA: Diagnosis not present

## 2020-08-05 DIAGNOSIS — E8801 Alpha-1-antitrypsin deficiency: Secondary | ICD-10-CM

## 2020-08-05 DIAGNOSIS — F3131 Bipolar disorder, current episode depressed, mild: Secondary | ICD-10-CM | POA: Diagnosis not present

## 2020-08-05 DIAGNOSIS — G4700 Insomnia, unspecified: Secondary | ICD-10-CM | POA: Diagnosis not present

## 2020-08-05 DIAGNOSIS — I77811 Abdominal aortic ectasia: Secondary | ICD-10-CM | POA: Diagnosis not present

## 2020-08-05 DIAGNOSIS — J449 Chronic obstructive pulmonary disease, unspecified: Secondary | ICD-10-CM | POA: Insufficient documentation

## 2020-08-05 MED ORDER — PAROXETINE HCL 20 MG PO TABS: 20.0000 mg | ORAL_TABLET | Freq: Two times a day (BID) | ORAL | 3 refills | Status: DC

## 2020-08-05 MED ORDER — SIMVASTATIN 40 MG PO TABS: ORAL_TABLET | ORAL | 3 refills | Status: DC

## 2020-08-05 MED ORDER — IPRATROPIUM-ALBUTEROL 0.5-2.5 (3) MG/3ML IN SOLN: 3.0000 mL | Freq: Three times a day (TID) | RESPIRATORY_TRACT | 1 refills | Status: DC | PRN

## 2020-08-05 NOTE — Progress Notes (Signed)
Name: Tyler ABILA Sr.   MRN: [id number]    DOB: [birth year]   Date:08/05/2020       Progress Note  Chief Complaint  Patient presents with   Hypertension   Hyperlipidemia   Depression     Subjective:   Tyler Cobb. is a 60 y.o. male, presents to clinic for routine f/up  Hypothyroidism:  last 12 months had dose adjustments with some SE with higher doses, most recently on 112 mcg daily levothyroxine Current Symptoms: denies fatigue, weight changes, heat/cold intolerance, bowel/skin changes or CVS symptoms, fatigue, anxiousness, and depression - most of which chronic sx/psych dx, no change previously with med changes other than some palpitations with higher dose - no change to moods with dose changes Most recent results are below; we will be repeating labs today - do to past abnormal labs and med dose sensitivities and recent changes Lab Results  Component Value Date   TSH 0.75 03/26/2020   Hyperlipidemia: Currently treated with simvastatin 40 , pt reports good med compliance Last Lipids: Lab Results  Component Value Date   CHOL 146 08/22/2019   HDL 31 (L) 08/22/2019   LDLCALC 93 08/22/2019   TRIG 119 08/22/2019   CHOLHDL 4.7 08/22/2019   - Denies: Chest pain, shortness of breath, myalgias, claudication  Prediabetes Recent pertinent labs: Lab Results  Component Value Date   HGBA1C 5.8 (H) 03/26/2020   HGBA1C 5.7 (H) 02/24/2019   HGBA1C 5.9 (H) 01/03/2018   Hypertension:  Currently managed on lisinopril-HCTZ Pt reports good med compliance and denies any SE.   Blood pressure today is well controlled. BP Readings from Last 3 Encounters:  08/05/20 116/64  03/26/20 132/78  11/27/19 126/74   Pt denies CP, SOB, exertional sx, LE edema, palpitation, Ha's, visual disturbances, lightheadedness, hypotension, syncope. Dietary efforts for BP?  Working on healthier diet/lifestyle  Bipolar, anxiety - on paxil only previously referred to psych - but referral  denied Depression screen [facility] 2/9 08/05/2020 03/26/2020 11/27/2019  Decreased Interest '1 3 3  '$ Down, Depressed, Hopeless - 3 3  PHQ - 2 Score '1 6 6  '$ Altered sleeping 1 2 -  Tired, decreased energy 2 3 -  Change in appetite 1 2 -  Feeling bad or failure about yourself  1 0 -  Trouble concentrating 1 1 -  Moving slowly or fidgety/restless 1 0 -  Suicidal thoughts 0 0 -  PHQ-9 Score 8 14 -  Difficult doing work/chores Somewhat difficult Somewhat difficult -  Some recent data might be hidden   GAD 7 : Generalized Anxiety Score 08/05/2020 03/26/2020 10/20/2019 08/22/2019  Nervous, Anxious, on Edge '1 3 3 1  '$ Control/stop worrying '1 3 3 '$ 0  Worry too much - different things '1 3 3 1  '$ Trouble relaxing 0 2 1 0  Restless 0 0 0 0  Easily annoyed or irritable '1 1 2 '$ 0  Afraid - awful might happen 1 0 0 0  Total GAD 7 Score '5 12 12 2  '$ Anxiety Difficulty Somewhat difficult Somewhat difficult Somewhat difficult Not difficult at all     Current daily smoker, no desire to quit Smoking cessation instruction/counseling given:  counseled patient on the dangers of tobacco use, advised patient to stop smoking, and reviewed strategies to maximize success  High H/H: Hemoglobin  Date Value Ref Range Status  08/22/2019 17.5 (H) 13.2 - 17.1 g/dL Final  02/24/2019 17.9 (H) 13.2 - 17.1 g/dL Final  02/16/2018 16.9 13.0 - 17.0 g/dL  Final    Comment:    Performed at [facility], [address] [place], [place] [address]  01/03/2018 17.4 (H) 13.2 - 17.1 g/dL Final   HGB  Date Value Ref Range Status  11/13/2013 18.3 (H) 13.0 - 18.0 g/dL Final       Current Outpatient Medications:    levothyroxine (SYNTHROID) 112 MCG tablet, Take 1 tablet (112 mcg total) by mouth daily., Disp: 90 tablet, Rfl: 3   lisinopril-hydrochlorothiazide (ZESTORETIC) 20-12.5 MG tablet, TAKE 1 TABLET BY MOUTH EVERY DAY, Disp: 90 tablet, Rfl: 3   PARoxetine (PAXIL) 20 MG tablet, TAKE 1 TABLET BY MOUTH TWICE A DAY, Disp: 180 tablet,  Rfl: 1   simvastatin (ZOCOR) 40 MG tablet, TAKE 1 TABLET BY MOUTH EVERYDAY AT BEDTIME, Disp: 90 tablet, Rfl: 0  Patient Active Problem List   Diagnosis Date Noted   Ectatic abdominal aorta ([place]) 08/15/2018   B12 deficiency 09/12/2017   Overweight (BMI 25.0-29.9) 07/02/2017   Herpes [id number]   Umbilical hernia without obstruction and without gangrene [id number]   Umbilical hernia [id number]   Scoliosis 01/08/2015   Spinal stenosis, lumbar 01/08/2015   DDD (degenerative disc disease), lumbar 01/08/2015   Agoraphobia with panic attacks 08/24/2014   AAT (alpha-1-antitrypsin) deficiency ([place])    Tobacco use    Hyperlipidemia    Hypertension    Hypothyroidism    Thyroid cyst    Anxiety    Bipolar disorder ([place])    Vitamin D deficiency disease    Insomnia    Secondary erythrocytosis 05/08/2014    Past Surgical History:  Procedure Laterality Date   COLONOSCOPY  2013   Dr. Orest Dikes 2    Family History  Problem Relation Age of Onset   Heart disease Father    Heart failure Father    Heart attack Father        x 67   Alcohol abuse Father    COPD Father    Dementia Mother    Hypertension Mother    Heart attack Brother    CAD Brother    Hypertension Brother    Alcohol abuse Paternal Grandfather    COPD Maternal Grandfather    Cancer Neg Hx    Diabetes Neg Hx    Stroke Neg Hx     Social History   Tobacco Use   Smoking status: Every Day    Packs/day: 1.00    Years: 40.00    Pack years: 40.00    Types: Cigarettes   Smokeless tobacco: Never  Vaping Use   Vaping Use: Never used  Substance Use Topics   Alcohol use: No   Drug use: No     Allergies  Allergen Reactions   No Known Allergies     Health Maintenance  Topic Date Due   COVID-19 Vaccine (1) Never done   Pneumococcal Vaccine 34-78 Years old (1 - PCV) Never done   Zoster Vaccines- Shingrix (1 of 2) Never done   INFLUENZA VACCINE  08/05/2020   COLONOSCOPY (Pts 45-12yr Insurance coverage will need to  be confirmed)  05/05/2021   TETANUS/TDAP  01/02/2026   Hepatitis C Screening  Completed   HIV Screening  Completed   HPV VACCINES  Aged Out    Chart Review Today: I personally reviewed active problem list, medication list, allergies, family history, social history, health maintenance, notes from last encounter, lab results, imaging with the patient/caregiver today.   Review of Systems  Constitutional:  Positive for fatigue. Negative for activity change,  appetite change, chills, diaphoresis, fever and unexpected weight change.  HENT: Negative.    Eyes: Negative.   Respiratory: Negative.    Cardiovascular: Negative.   Gastrointestinal: Negative.   Endocrine: Negative.   Genitourinary: Negative.   Musculoskeletal: Negative.   Skin: Negative.   Allergic/Immunologic: Negative.   Neurological: Negative.   Hematological: Negative.   Psychiatric/Behavioral:  Positive for dysphoric mood. Negative for agitation, behavioral problems, confusion, decreased concentration, hallucinations, self-injury, sleep disturbance and suicidal ideas. The patient is nervous/anxious. The patient is not hyperactive.   All other systems reviewed and are negative.   Objective:   Vitals:   08/05/20 0931  BP: 116/64  Pulse: 73  Resp: 16  Temp: 98 F (36.7 C)  SpO2: 96%  Weight: 203 lb 8 oz (92.3 kg)  Height: '5\' 10"'$  (1.778 m)    Body mass index is 29.2 kg/m.  Physical Exam Vitals and nursing note reviewed.  Constitutional:      General: He is not in acute distress.    Appearance: Normal appearance. He is obese. He is not ill-appearing, toxic-appearing or diaphoretic.  HENT:     Head: Normocephalic and atraumatic.     Right Ear: External ear normal.     Left Ear: External ear normal.  Eyes:     General: No scleral icterus.       Right eye: No discharge.        Left eye: No discharge.     Conjunctiva/sclera: Conjunctivae normal.  Cardiovascular:     Rate and Rhythm: Normal rate and regular  rhythm.     Pulses: Normal pulses.     Heart sounds: Normal heart sounds.  Pulmonary:     Effort: Pulmonary effort is normal.     Breath sounds: Normal breath sounds.  Abdominal:     General: Abdomen is protuberant. Bowel sounds are normal. There is no abdominal bruit.     Palpations: Abdomen is soft. There is no mass or pulsatile mass.     Tenderness: There is no abdominal tenderness. There is no guarding or rebound.  Musculoskeletal:     Right lower leg: No edema.     Left lower leg: No edema.  Skin:    General: Skin is warm and dry.     Coloration: Skin is not jaundiced or pale.     Findings: No lesion.  Neurological:     Mental Status: He is alert. Mental status is at baseline.  Psychiatric:        Attention and Perception: Attention normal.        Mood and Affect: Mood and affect normal.        Speech: Speech normal.        Behavior: Behavior normal. Behavior is cooperative.        Thought Content: Thought content normal. Thought content does not include homicidal or suicidal ideation. Thought content does not include homicidal or suicidal plan.        Assessment & Plan:   1. Essential hypertension Stable, well controlled, BP excellent today - at goal BP Readings from Last 3 Encounters:  08/05/20 116/64  03/26/20 132/78  11/27/19 126/74     Chemistry      Component Value Date/Time   NA 140 03/26/2020 0955   NA 141 01/08/2015 1115   K 4.7 03/26/2020 0955   CL 101 03/26/2020 0955   CO2 33 (H) 03/26/2020 0955   BUN 13 03/26/2020 0955   BUN 13 01/08/2015 1115   CREATININE 0.96  03/26/2020 0955      Component Value Date/Time   CALCIUM 9.8 03/26/2020 0955   ALKPHOS 84 06/11/2016 0934   AST 21 03/26/2020 0955   ALT 19 03/26/2020 0955   BILITOT 0.6 03/26/2020 0955   BILITOT 0.3 01/08/2015 1115    Repeat labs and continue same dose - COMPLETE METABOLIC PANEL WITH GFR  2. Mixed hyperlipidemia Compliant with meds, no SE, no myalgias, fatigue or jaundice Due  for FLP and recheck CMP Diet and exercise recommendations for HLD reviewed and handout given - simvastatin (ZOCOR) 40 MG tablet; TAKE 1 TABLET BY MOUTH EVERYDAY AT BEDTIME  Dispense: 90 tablet; Refill: 3 - Lipid panel - COMPLETE METABOLIC PANEL WITH GFR  3. Bipolar affective disorder, currently depressed, mild ([place]) Refer to psych  4. Hypothyroidism, unspecified type Rechecking labs due to recent med dose changes and abnormal labs - TSH  5. Secondary erythrocytosis Monitoring - pt has refused past referral and recommendations to stop smoking and f/up with specialists - CBC with Differential/Platelet  6. Insomnia, unspecified type Sx of other psychiatric dx, poorly controlled, refer to psych  7. Tobacco use See above - 1 ppd  8. Ectatic abdominal aorta ([place]) Monitoring - will need to review chart for when he next needs f/up scan  - reviewed CT found dx in 2015, due for f/up UA in 5 years - ordered No abd bruit or pulsatile mass on exam   9. B12 deficiency Recheck  - CBC with Differential/Platelet - Vitamin B12  10. AAT (alpha-1-antitrypsin) deficiency ([place])   11. Agoraphobia with panic attacks Refer again to psych - PARoxetine (PAXIL) 20 MG tablet; Take 1 tablet (20 mg total) by mouth 2 (two) times daily.  Dispense: 180 tablet; Refill: 3  12. Vitamin D deficiency disease  - COMPLETE METABOLIC PANEL WITH GFR - VITAMIN D 25 Hydroxy (Vit-D Deficiency, Fractures)  13. Chronic obstructive pulmonary disease, unspecified COPD type ([place]) Not using inhalers, refuses referral - high risk with smoking hx and AAT deficiency  Recommended pulm consult/f/up and vaccinations Recently had COVID, chronic smokers cough was worse and he has coughing fits, mild tightness/wheezing Has nebulizer at home but no meds for it   Return in about 6 months (around 02/05/2021) for Routine follow-up.   Delsa Grana, PA-C 08/05/20 9:40 AM

## 2020-08-06 LAB — CBC WITH DIFFERENTIAL/PLATELET
Absolute Monocytes: 749 cells/uL (ref 200–950)
Basophils Absolute: 118 cells/uL (ref 0–200)
Basophils Relative: 1.1 %
Eosinophils Absolute: 310 cells/uL (ref 15–500)
Eosinophils Relative: 2.9 %
HCT: 54.8 % — ABNORMAL HIGH (ref 38.5–50.0)
Hemoglobin: 18.1 g/dL — ABNORMAL HIGH (ref 13.2–17.1)
Lymphs Abs: 2461 cells/uL (ref 850–3900)
MCH: 28.4 pg (ref 27.0–33.0)
MCHC: 33 g/dL (ref 32.0–36.0)
MCV: 85.9 fL (ref 80.0–100.0)
MPV: 9.9 fL (ref 7.5–12.5)
Monocytes Relative: 7 %
Neutro Abs: 7062 cells/uL (ref 1500–7800)
Neutrophils Relative %: 66 %
Platelets: 449 10*3/uL — ABNORMAL HIGH (ref 140–400)
RBC: 6.38 10*6/uL — ABNORMAL HIGH (ref 4.20–5.80)
RDW: 14.5 % (ref 11.0–15.0)
Total Lymphocyte: 23 %
WBC: 10.7 10*3/uL (ref 3.8–10.8)

## 2020-08-06 LAB — VITAMIN B12: Vitamin B-12: 334 pg/mL (ref 200–1100)

## 2020-08-06 LAB — COMPLETE METABOLIC PANEL WITH GFR
AG Ratio: 1.3 (calc) (ref 1.0–2.5)
ALT: 28 U/L (ref 9–46)
AST: 27 U/L (ref 10–35)
Albumin: 4.3 g/dL (ref 3.6–5.1)
Alkaline phosphatase (APISO): 104 U/L (ref 35–144)
BUN: 14 mg/dL (ref 7–25)
CO2: 24 mmol/L (ref 20–32)
Calcium: 9.8 mg/dL (ref 8.6–10.3)
Chloride: 102 mmol/L (ref 98–110)
Creat: 0.91 mg/dL (ref 0.70–1.35)
Globulin: 3.3 g/dL (calc) (ref 1.9–3.7)
Glucose, Bld: 81 mg/dL (ref 65–99)
Potassium: 4.7 mmol/L (ref 3.5–5.3)
Sodium: 139 mmol/L (ref 135–146)
Total Bilirubin: 0.6 mg/dL (ref 0.2–1.2)
Total Protein: 7.6 g/dL (ref 6.1–8.1)
eGFR: 95 mL/min/{1.73_m2} (ref >=60)

## 2020-08-06 LAB — LIPID PANEL
Cholesterol: 134 mg/dL (ref <200)
HDL: 35 mg/dL — ABNORMAL LOW (ref >=40)
LDL Cholesterol (Calc): 79 mg/dL (calc)
Non-HDL Cholesterol (Calc): 99 mg/dL (calc) (ref <130)
Total CHOL/HDL Ratio: 3.8 (calc) (ref <5.0)
Triglycerides: 117 mg/dL (ref <150)

## 2020-08-06 LAB — TSH: TSH: 0.57 mIU/L (ref 0.40–4.50)

## 2020-08-06 LAB — VITAMIN D 25 HYDROXY (VIT D DEFICIENCY, FRACTURES): Vit D, 25-Hydroxy: 27 ng/mL — ABNORMAL LOW (ref 30–100)

## 2020-08-09 ENCOUNTER — Other Ambulatory Visit (INDEPENDENT_AMBULATORY_CARE_PROVIDER_SITE_OTHER): Payer: Medicaid Other

## 2020-09-05 DIAGNOSIS — Z419 Encounter for procedure for purposes other than remedying health state, unspecified: Secondary | ICD-10-CM | POA: Diagnosis not present

## 2020-09-19 ENCOUNTER — Other Ambulatory Visit: Payer: Self-pay | Admitting: Family Medicine

## 2020-10-05 DIAGNOSIS — Z419 Encounter for procedure for purposes other than remedying health state, unspecified: Secondary | ICD-10-CM | POA: Diagnosis not present

## 2020-11-05 DIAGNOSIS — Z419 Encounter for procedure for purposes other than remedying health state, unspecified: Secondary | ICD-10-CM | POA: Diagnosis not present

## 2020-12-05 DIAGNOSIS — Z419 Encounter for procedure for purposes other than remedying health state, unspecified: Secondary | ICD-10-CM | POA: Diagnosis not present

## 2021-01-05 DIAGNOSIS — Z419 Encounter for procedure for purposes other than remedying health state, unspecified: Secondary | ICD-10-CM | POA: Diagnosis not present

## 2021-02-05 DIAGNOSIS — Z419 Encounter for procedure for purposes other than remedying health state, unspecified: Secondary | ICD-10-CM | POA: Diagnosis not present

## 2021-02-10 ENCOUNTER — Telehealth: Payer: Medicaid Other | Admitting: Physician Assistant

## 2021-03-05 DIAGNOSIS — Z419 Encounter for procedure for purposes other than remedying health state, unspecified: Secondary | ICD-10-CM | POA: Diagnosis not present

## 2021-04-05 DIAGNOSIS — Z419 Encounter for procedure for purposes other than remedying health state, unspecified: Secondary | ICD-10-CM | POA: Diagnosis not present

## 2021-05-05 DIAGNOSIS — Z419 Encounter for procedure for purposes other than remedying health state, unspecified: Secondary | ICD-10-CM | POA: Diagnosis not present

## 2021-05-15 ENCOUNTER — Emergency Department
Admission: EM | Admit: 2021-05-15 | Discharge: 2021-05-15 | Disposition: A | Discharge status: Home / Self Care | Payer: Medicaid Other | Attending: Emergency Medicine | Admitting: Emergency Medicine

## 2021-05-15 ENCOUNTER — Encounter: Payer: Self-pay | Admitting: Internal Medicine

## 2021-05-15 ENCOUNTER — Other Ambulatory Visit: Payer: Self-pay

## 2021-05-15 ENCOUNTER — Emergency Department: Payer: Medicaid Other

## 2021-05-15 DIAGNOSIS — R0602 Shortness of breath: Secondary | ICD-10-CM | POA: Insufficient documentation

## 2021-05-15 DIAGNOSIS — R0789 Other chest pain: Secondary | ICD-10-CM | POA: Diagnosis not present

## 2021-05-15 DIAGNOSIS — R079 Chest pain, unspecified: Secondary | ICD-10-CM | POA: Diagnosis not present

## 2021-05-15 DIAGNOSIS — I10 Essential (primary) hypertension: Secondary | ICD-10-CM | POA: Diagnosis not present

## 2021-05-15 DIAGNOSIS — J449 Chronic obstructive pulmonary disease, unspecified: Secondary | ICD-10-CM | POA: Diagnosis not present

## 2021-05-15 DIAGNOSIS — E039 Hypothyroidism, unspecified: Secondary | ICD-10-CM | POA: Insufficient documentation

## 2021-05-15 DIAGNOSIS — R110 Nausea: Secondary | ICD-10-CM | POA: Diagnosis not present

## 2021-05-15 LAB — CBC
HCT: 53.5 % — ABNORMAL HIGH (ref 39.0–52.0)
Hemoglobin: 17.5 g/dL — ABNORMAL HIGH (ref 13.0–17.0)
MCH: 28 pg (ref 26.0–34.0)
MCHC: 32.7 g/dL (ref 30.0–36.0)
MCV: 85.5 fL (ref 80.0–100.0)
Platelets: 408 10*3/uL — ABNORMAL HIGH (ref 150–400)
RBC: 6.26 MIL/uL — ABNORMAL HIGH (ref 4.22–5.81)
RDW: 14 % (ref 11.5–15.5)
WBC: 10.1 10*3/uL (ref 4.0–10.5)
nRBC: 0 % (ref 0.0–0.2)

## 2021-05-15 LAB — BASIC METABOLIC PANEL
Anion gap: 8 (ref 5–15)
BUN: 12 mg/dL (ref 8–23)
CO2: 29 mmol/L (ref 22–32)
Calcium: 9.3 mg/dL (ref 8.9–10.3)
Chloride: 100 mmol/L (ref 98–111)
Creatinine, Ser: 0.96 mg/dL (ref 0.61–1.24)
GFR, Estimated: >60 mL/min (ref >60)
Glucose, Bld: 107 mg/dL — ABNORMAL HIGH (ref 70–99)
Potassium: 3.7 mmol/L (ref 3.5–5.1)
Sodium: 137 mmol/L (ref 135–145)

## 2021-05-15 LAB — TROPONIN I (HIGH SENSITIVITY): Troponin I (High Sensitivity): 4 ng/L (ref <18)

## 2021-05-15 NOTE — ED Provider Notes (Signed)
? ? ?[facility] ?Emergency Department Provider Note ? ? ? ? Event Date/Time  ? First MD Initiated Contact with Patient 05/15/21 1118   ?  (approximate) ? ? ?History  ? ?Chest Pain ? ? ?HPI ? ?Tyler REGALA Sr. is a 61 y.o. male with a history of hypertension, hypothyroidism, hyperlipidemia, and COPD, presenting to the ED for 3 weeks of intermittent chest pain.  Patient describes generalized pain all over the anterior chest, and notes the pain is intermittent.  He denies any aggravating or alleviating factors.  Patient denies any associated nausea, vomiting, diaphoresis, or cough.  Patient is a current everyday smoker and notes some associated shortness of breath. Patient denies any current chest pain at this time. ? ? ?Physical Exam  ? ?Triage Vital Signs: ?ED Triage Vitals [05/15/21 0931]  ?Enc Vitals Group  ?   BP (!) 148/77  ?   Pulse Rate 62  ?   Resp 18  ?   Temp 97.8 ?F (36.6 ?C)  ?   Temp Source Oral  ?   SpO2 95 %  ?   Weight 210 lb (95.3 kg)  ?   Height '5\' 10"'$  (1.778 m)  ?   Head Circumference   ?   Peak Flow   ?   Pain Score 5  ?   Pain Loc   ?   Pain Edu?   ?   Excl. in [place]?   ? ? ?Most recent vital signs: ?Vitals:  ? 05/15/21 0931  ?BP: (!) 148/77  ?Pulse: 62  ?Resp: 18  ?Temp: 97.8 ?F (36.6 ?C)  ?SpO2: 95%  ? ? ?General Awake, no distress. Resting comfortably in the room watching TV ?HEENT NCAT. PERRL. EOMI. No rhinorrhea. Mucous membranes are moist.  ?CV:  Good peripheral perfusion.  ?RESP:  Normal effort.  ?ABD:  No distention.  ? ? ?ED Results / Procedures / Treatments  ? ?Labs ?(all labs ordered are listed, but only abnormal results are displayed) ?Labs Reviewed  ?BASIC METABOLIC PANEL - Abnormal; Notable for the following components:  ?    Result Value  ? Glucose, Bld 107 (*)   ? All other components within normal limits  ?CBC - Abnormal; Notable for the following components:  ? RBC 6.26 (*)   ? Hemoglobin 17.5 (*)   ? HCT 53.5 (*)   ? Platelets 408 (*)   ? All other  components within normal limits  ?TROPONIN I (HIGH SENSITIVITY)  ?TROPONIN I (HIGH SENSITIVITY)  ? ? ? ?EKG ? ?Vent. rate 60 BPM ?PR interval 166 ms ?QRS duration 90 ms ?QT/QTcB 384/384 ms ?P-R-T axes 84 136 71 ?NSR ?Normal axis ?No STEMI ? ?RADIOLOGY ? ?I personally viewed and evaluated these images as part of my medical decision making, as well as reviewing the written report by the radiologist. ? ?ED Provider Interpretation: no acute findings} ? ?DG Chest 2 View ? ?Result Date: 05/15/2021 ?CLINICAL DATA:  Chest pain starting 3 weeks ago, intermittent, some shortness of breath and mild nausea EXAM: CHEST - 2 VIEW COMPARISON:  Chest radiograph 05/11/2013 FINDINGS: The cardiomediastinal silhouette is normal. The lungs hyperinflated with flattening of the diaphragms suggesting underlying COPD. There is no focal consolidation or pulmonary edema. There is no pleural effusion or pneumothorax. Pleural thickening along the right chest wall is unchanged since 2015. There is no acute osseous abnormality. IMPRESSION: Hyperinflated lungs suggesting underlying COPD. No radiographic evidence of acute cardiopulmonary process. Electronically Signed   By: Collier Salina  Noone M.D.   On: 05/15/2021 09:51   ? ? ?PROCEDURES: ? ?Critical Care performed: No ? ?Procedures ? ? ?MEDICATIONS ORDERED IN ED: ?Medications - No data to display ? ? ?IMPRESSION / MDM / ASSESSMENT AND PLAN / ED COURSE  ?I reviewed the triage vital signs and the nursing notes. ?             ?               ? ?Differential diagnosis includes, but is not limited to, ACS, aortic dissection, pulmonary embolism, cardiac tamponade, pneumothorax, pneumonia, pericarditis, myocarditis, GI-related causes including esophagitis/gastritis, and musculoskeletal chest wall pain.   ? ?The patient is on the cardiac monitor to evaluate for evidence of arrhythmia and/or significant heart rate changes. ? ?Patient to the ED with 3 weeks of intermittent anterior chest pain presenting to the ED  today without acute complaints.  Patient denies any current pain.  His exam is reassuring as it shows no signs of ACS.  Doubt serious cardiac etiology given the patient's normal work-up and normal troponin.  No intrathoracic process changes on his chest x-ray.  Patient left the ED prior to the second troponin but all labs and imaging at this time are reassuring.  Patient was witnessed walking out of the room.  I asked the patient to return and he threw up his hand and walked away.  I did advise him to return to the ED if he had any recurrent knee. ? ? ?  ? ? ?FINAL CLINICAL IMPRESSION(S) / ED DIAGNOSES  ? ?Final diagnoses:  ?Chest pain, unspecified type  ? ? ? ?Rx / DC Orders  ? ?ED Discharge Orders   ? ? None  ? ?  ? ? ? ?Note:  This document was prepared using Dragon voice recognition software and may include unintentional dictation errors. ? ?  ?Melvenia Needles, PA-C ?05/15/21 1153 ? ?  ?Delman Kitten, MD ?05/15/21 1214 ? ?

## 2021-05-15 NOTE — ED Triage Notes (Signed)
Pt here with cp that started 3 weeks ago. Pt states pain is generalized and all over. Pt states pain is intermittent. Pt is a smoker. Pt states some SOB and mild nausea. ?

## 2021-05-15 NOTE — ED Notes (Signed)
This RN to bedside for assessment and IV start, IV attempted. Pt. Very upset, states he want to leave. This RN apologized for his wait, encouraged pt. To wait to see provider. Provider, [place] notified, attempted to speak with pt. Pt. Continued to walk out.  ?

## 2021-05-16 ENCOUNTER — Telehealth: Payer: Self-pay

## 2021-05-16 NOTE — Telephone Encounter (Signed)
Transition Care Management Follow-up Telephone Call ?Date of discharge and from where: 05/15/2021 from [facility] ?How have you been since you were released from the hospital? Patient stated that he was fine and that he "did not wanting anything else to do with that place". Patient then hung up the phone.  ?Any questions or concerns? No ? ?

## 2021-06-05 DIAGNOSIS — Z419 Encounter for procedure for purposes other than remedying health state, unspecified: Secondary | ICD-10-CM | POA: Diagnosis not present

## 2021-06-08 ENCOUNTER — Other Ambulatory Visit: Payer: Self-pay | Admitting: Family Medicine

## 2021-06-08 DIAGNOSIS — I10 Essential (primary) hypertension: Secondary | ICD-10-CM

## 2021-06-10 ENCOUNTER — Ambulatory Visit: Payer: Medicaid Other | Admitting: Family Medicine

## 2021-06-10 ENCOUNTER — Encounter: Payer: Self-pay | Admitting: Family Medicine

## 2021-06-10 ENCOUNTER — Telehealth: Payer: Self-pay

## 2021-06-10 VITALS — BP 138/78 | HR 83 | Temp 98.1°F | Resp 16 | Ht 70.0 in | Wt 210.2 lb

## 2021-06-10 DIAGNOSIS — J449 Chronic obstructive pulmonary disease, unspecified: Secondary | ICD-10-CM

## 2021-06-10 DIAGNOSIS — Z1211 Encounter for screening for malignant neoplasm of colon: Secondary | ICD-10-CM | POA: Diagnosis not present

## 2021-06-10 DIAGNOSIS — E039 Hypothyroidism, unspecified: Secondary | ICD-10-CM | POA: Diagnosis not present

## 2021-06-10 DIAGNOSIS — F3131 Bipolar disorder, current episode depressed, mild: Secondary | ICD-10-CM

## 2021-06-10 DIAGNOSIS — E782 Mixed hyperlipidemia: Secondary | ICD-10-CM | POA: Diagnosis not present

## 2021-06-10 DIAGNOSIS — E559 Vitamin D deficiency, unspecified: Secondary | ICD-10-CM | POA: Diagnosis not present

## 2021-06-10 DIAGNOSIS — E538 Deficiency of other specified B group vitamins: Secondary | ICD-10-CM | POA: Diagnosis not present

## 2021-06-10 DIAGNOSIS — Z720 Tobacco use: Secondary | ICD-10-CM

## 2021-06-10 DIAGNOSIS — I77811 Abdominal aortic ectasia: Secondary | ICD-10-CM

## 2021-06-10 DIAGNOSIS — D751 Secondary polycythemia: Secondary | ICD-10-CM | POA: Diagnosis not present

## 2021-06-10 DIAGNOSIS — I10 Essential (primary) hypertension: Secondary | ICD-10-CM

## 2021-06-10 DIAGNOSIS — R079 Chest pain, unspecified: Secondary | ICD-10-CM

## 2021-06-10 DIAGNOSIS — Z5181 Encounter for therapeutic drug level monitoring: Secondary | ICD-10-CM | POA: Diagnosis not present

## 2021-06-10 DIAGNOSIS — F4001 Agoraphobia with panic disorder: Secondary | ICD-10-CM

## 2021-06-10 DIAGNOSIS — R0600 Dyspnea, unspecified: Secondary | ICD-10-CM

## 2021-06-10 MED ORDER — SYMBICORT 160-4.5 MCG/ACT IN AERO: 2.0000 | INHALATION_SPRAY | Freq: Two times a day (BID) | RESPIRATORY_TRACT | 3 refills | Status: DC

## 2021-06-10 MED ORDER — SIMVASTATIN 40 MG PO TABS: ORAL_TABLET | ORAL | 3 refills | Status: DC

## 2021-06-10 MED ORDER — LISINOPRIL-HYDROCHLOROTHIAZIDE 20-12.5 MG PO TABS: 1.0000 | ORAL_TABLET | Freq: Every day | ORAL | 3 refills | Status: DC

## 2021-06-10 MED ORDER — PAROXETINE HCL 20 MG PO TABS: 20.0000 mg | ORAL_TABLET | Freq: Two times a day (BID) | ORAL | 3 refills | Status: DC

## 2021-06-10 NOTE — Assessment & Plan Note (Signed)
Anxiety sx worse, recent death of his mother, he cared for her, he is dealing with family and estate and more bills not on his own, PHQ 9 negative, GAD 7 worse - he wants to continue paxil 20 mg BID and continues to refuse psych

## 2021-06-10 NOTE — Assessment & Plan Note (Signed)
Has been better controlled in the past, didn't take meds today, will continue same med/dose - refills sent in yesterday BP Readings from Last 3 Encounters:  06/10/21 138/78  05/15/21 (!) 148/77  08/05/20 116/64     Chemistry      Component Value Date/Time   NA 137 05/15/2021 0936   NA 141 01/08/2015 1115   K 3.7 05/15/2021 0936   CL 100 05/15/2021 0936   CO2 29 05/15/2021 0936   BUN 12 05/15/2021 0936   BUN 13 01/08/2015 1115   CREATININE 0.96 05/15/2021 0936   CREATININE 0.91 08/05/2020 1015      Component Value Date/Time   CALCIUM 9.3 05/15/2021 0936   ALKPHOS 84 06/11/2016 0934   AST 27 08/05/2020 1015   ALT 28 08/05/2020 1015   BILITOT 0.6 08/05/2020 1015   BILITOT 0.3 01/08/2015 1115    Repeat labs and continue same dose - COMPLETE METABOLIC PANEL WITH GFR

## 2021-06-10 NOTE — Assessment & Plan Note (Signed)
Previously referred to psych several times, pt refuses He misses appt due to anxiety/agoraphobia Encouraged him to go back to psych to allow him to function better in general We can do virtual visits when agoraphobia is too severe

## 2021-06-10 NOTE — Assessment & Plan Note (Signed)
Last month he's taken half dose - so labs may be worse Otherwise he is usually Compliant with meds, no SE, no myalgias, fatigue or jaundice Will recheck lipids and recheck CMP Diet and exercise recommendations for HLD reviewed

## 2021-06-10 NOTE — Assessment & Plan Note (Signed)
Not using inhalers, refuses referral - high risk with smoking hx and AAT deficiency  Recommended pulm consult/f/up and vaccinations Has nebulizer at home - using duonebs prn He agreed to restart daily inhaler, PFT's ordered - will see if we can get done outpt w/o pulm referral

## 2021-06-10 NOTE — Assessment & Plan Note (Signed)
Low end of normal last year - encourage him to take supplement - may help with fatigue sx Lab Results  Component Value Date   VITAMINB12 334 08/05/2020

## 2021-06-10 NOTE — Progress Notes (Signed)
Name: OSMANI KERSTEN Sr.   MRN: [id number]    DOB: Sep 02, 1960   Date:06/10/2021       Progress Note  Chief Complaint  Patient presents with   Follow-up   Hypertension    Pt has not taken pill today due to running out.   Hyperlipidemia   Hypothyroidism   Anxiety     Subjective:   Gurshaan Matsuoka. is a 61 y.o. male, presents to clinic for routine f/up  Hypothyroidism:  Still feeling fatigued - taking meds correctly in am on empty stomach Prior hx: last 12 months had dose adjustments with some SE with higher doses, most recently on 112 mcg daily levothyroxine Current Symptoms: denies fatigue, weight changes, heat/cold intolerance, bowel/skin changes or CVS symptoms, fatigue, anxiousness, and depression - most of which chronic sx/psych dx, no change previously with med changes other than some palpitations with higher dose - no change to moods with dose changes Most recent results are below; we will be repeating labs today - do to past abnormal labs and med dose sensitivities and recent changes Last labs: Lab Results  Component Value Date   TSH 0.57 08/05/2020   Hyperlipidemia:  he has beent aking half dose, thought he was running out Currently treated with simvastatin 40 , pt reports otherwise good med compliance and no SE Last Lipids: Lab Results  Component Value Date   CHOL 134 08/05/2020   HDL 35 (L) 08/05/2020   LDLCALC 79 08/05/2020   TRIG 117 08/05/2020   CHOLHDL 3.8 08/05/2020   - Denies:, myalgias, claudication  Prediabetes- not on meds  Recent pertinent labs: Lab Results  Component Value Date   HGBA1C 5.8 (H) 03/26/2020   HGBA1C 5.7 (H) 02/24/2019   HGBA1C 5.9 (H) 01/03/2018   Hypertension:  Currently managed on lisinopril-HCTZ- ran out yesterday didn't take today Pt reports good med compliance and denies any SE.   Blood pressure today is well controlled. BP Readings from Last 3 Encounters:  06/10/21 138/78  05/15/21 (!) 148/77  08/05/20 116/64    Pt denies exertional sx, LE edema, palpitation, Ha's, visual disturbances, lightheadedness, hypotension, syncope. Dietary efforts for BP?  Working on healthier diet/lifestyle   Bipolar, anxiety - on paxil 20 mg BID, only previously referred to psych - but referral denied - states stress and mood are still bad, doesn't want to do anything, continues to refuse psych    06/10/2021    9:40 AM 08/05/2020    9:32 AM 03/26/2020    9:18 AM  Depression screen PHQ 2/9  Decreased Interest 0 1 3  Down, Depressed, Hopeless 0  3  PHQ - 2 Score 0 1 6  Altered sleeping 0 1 2  Tired, decreased energy 0 2 3  Change in appetite 0 1 2  Feeling bad or failure about yourself  0 1 0  Trouble concentrating 0 1 1  Moving slowly or fidgety/restless 0 1 0  Suicidal thoughts 0 0 0  PHQ-9 Score 0 8 14  Difficult doing work/chores Not difficult at all Somewhat difficult Somewhat difficult  Phq9 reviewed and neg - though he is sx    06/10/2021    9:41 AM 08/05/2020    9:35 AM 03/26/2020    9:21 AM 10/20/2019   11:59 AM  GAD 7 : Generalized Anxiety Score  Nervous, Anxious, on Edge '3 1 3 3  '$ Control/stop worrying '3 1 3 3  '$ Worry too much - different things 0 '1 3 3  '$ Trouble  relaxing 3 0 2 1  Restless 0 0 0 0  Easily annoyed or irritable '1 1 1 2  '$ Afraid - awful might happen 3 1 0 0  Total GAD 7 Score '13 5 12 12  '$ Anxiety Difficulty Very difficult Somewhat difficult Somewhat difficult Somewhat difficult  GAD 7 worse   Current daily smoker, no desire to quit - cigarettes 1.5 packs per day - doesn't like it but can't quit -  Uses nebs prn Not on daily inhaler, recent ER visit for CP CXR showed COPD - no prior pulm consult of PFTs Qualifies for lung ca screening low dose CT Smoking cessation instruction/counseling given:  counseled patient on the dangers of tobacco use, advised patient to stop smoking, and reviewed strategies to maximize success   High H/H: Hemoglobin  Date Value Ref Range Status  05/15/2021  17.5 (H) 13.0 - 17.0 g/dL Final  08/05/2020 18.1 (H) 13.2 - 17.1 g/dL Final  08/22/2019 17.5 (H) 13.2 - 17.1 g/dL Final  02/24/2019 17.9 (H) 13.2 - 17.1 g/dL Final   HGB  Date Value Ref Range Status  11/13/2013 18.3 (H) 13.0 - 18.0 g/dL Final   Recent ER visit May - negative cardiac enzymes  Thinks it may be from smoking or lung disease - refuses pulm referral     Current Outpatient Medications:    ipratropium-albuterol (DUONEB) 0.5-2.5 (3) MG/3ML SOLN, Take 3 mLs by nebulization 3 (three) times daily as needed., Disp: 180 mL, Rfl: 1   levothyroxine (SYNTHROID) 112 MCG tablet, TAKE 1 TABLET BY MOUTH EVERY DAY, Disp: 90 tablet, Rfl: 3   PARoxetine (PAXIL) 20 MG tablet, Take 1 tablet (20 mg total) by mouth 2 (two) times daily., Disp: 180 tablet, Rfl: 3   simvastatin (ZOCOR) 40 MG tablet, TAKE 1 TABLET BY MOUTH EVERYDAY AT BEDTIME, Disp: 90 tablet, Rfl: 3   lisinopril-hydrochlorothiazide (ZESTORETIC) 20-12.5 MG tablet, TAKE 1 TABLET BY MOUTH EVERY DAY (Patient not taking: Reported on 06/10/2021), Disp: 90 tablet, Rfl: 0  Patient Active Problem List   Diagnosis Date Noted   Chronic obstructive pulmonary disease, unspecified COPD type ([place]) 08/05/2020   Ectatic abdominal aorta ([place]) 08/15/2018   B12 deficiency 09/12/2017   Overweight (BMI 25.0-29.9) 07/02/2017   Herpes 67/89/3810   Umbilical hernia without obstruction and without gangrene 17/51/0258   Umbilical hernia 52/77/8242   Scoliosis 01/08/2015   Spinal stenosis, lumbar 01/08/2015   DDD (degenerative disc disease), lumbar 01/08/2015   Agoraphobia with panic attacks 08/24/2014   AAT (alpha-1-antitrypsin) deficiency ([place])    Tobacco use    Hyperlipidemia    Hypertension    Hypothyroidism    Thyroid cyst    Anxiety    Bipolar disorder ([place])    Vitamin D deficiency disease    Insomnia    Secondary erythrocytosis 05/08/2014    Past Surgical History:  Procedure Laterality Date   COLONOSCOPY  2013   Dr. Orest Dikes 2     Family History  Problem Relation Age of Onset   Heart disease Father    Heart failure Father    Heart attack Father        x 52   Alcohol abuse Father    COPD Father    Dementia Mother    Hypertension Mother    Heart attack Brother    CAD Brother    Hypertension Brother    Alcohol abuse Paternal Grandfather    COPD Maternal Grandfather    Cancer Neg Hx    Diabetes Neg Hx  Stroke Neg Hx     Social History   Tobacco Use   Smoking status: Every Day    Packs/day: 1.00    Years: 40.00    Pack years: 40.00    Types: Cigarettes   Smokeless tobacco: Never  Vaping Use   Vaping Use: Never used  Substance Use Topics   Alcohol use: No   Drug use: No     Allergies  Allergen Reactions   No Known Allergies     Health Maintenance  Topic Date Due   COLONOSCOPY (Pts 45-45yr Insurance coverage will need to be confirmed)  05/05/2021   Zoster Vaccines- Shingrix (1 of 2) 09/10/2021 (Originally 04/02/2008)   INFLUENZA VACCINE  08/05/2021   TETANUS/TDAP  01/02/2026   Hepatitis C Screening  Completed   HIV Screening  Completed   HPV VACCINES  Aged Out   COVID-19 Vaccine  Discontinued    Chart Review Today: I personally reviewed active problem list, medication list, allergies, family history, social history, health maintenance, notes from last encounter, lab results, imaging with the patient/caregiver today.    Review of Systems  Constitutional:  Positive for fatigue. Negative for activity change, appetite change, chills, diaphoresis (chronic), fever and unexpected weight change.  HENT: Negative.    Eyes: Negative.   Respiratory: Negative.    Cardiovascular: Negative.   Gastrointestinal: Negative.   Endocrine: Negative.   Genitourinary: Negative.   Musculoskeletal: Negative.   Skin: Negative.   Allergic/Immunologic: Negative.   Neurological: Negative.   Hematological: Negative.   Psychiatric/Behavioral: Negative.    All other systems reviewed and are negative.    Objective:   Vitals:   06/10/21 0942 06/10/21 0955  BP: (!) 144/78 138/78  Pulse: 83   Resp: 16   Temp: 98.1 F (36.7 C)   TempSrc: Oral   SpO2: 95%   Weight: 210 lb 3.2 oz (95.3 kg)   Height: '5\' 10"'$  (1.778 m)     Body mass index is 30.16 kg/m.  Physical Exam Vitals and nursing note reviewed.  Constitutional:      General: He is not in acute distress.    Appearance: Normal appearance. He is well-developed, well-groomed and overweight. He is not ill-appearing, toxic-appearing or diaphoretic.  HENT:     Head: Normocephalic and atraumatic.     Jaw: No trismus.     Right Ear: Tympanic membrane, ear canal and external ear normal.     Left Ear: Tympanic membrane, ear canal and external ear normal.     Nose: No mucosal edema or rhinorrhea.     Right Sinus: No maxillary sinus tenderness or frontal sinus tenderness.     Left Sinus: No maxillary sinus tenderness or frontal sinus tenderness.     Mouth/Throat:     Pharynx: Uvula midline. No oropharyngeal exudate, posterior oropharyngeal erythema or uvula swelling.  Eyes:     General: Lids are normal.     Conjunctiva/sclera: Conjunctivae normal.     Pupils: Pupils are equal, round, and reactive to light.  Neck:     Trachea: Trachea and phonation normal. No tracheal deviation.  Cardiovascular:     Rate and Rhythm: Regular rhythm.     Pulses: Normal pulses.          Radial pulses are 2+ on the right side and 2+ on the left side.       Posterior tibial pulses are 2+ on the right side and 2+ on the left side.     Heart sounds: Normal heart sounds.  No murmur heard.   No friction rub. No gallop.  Pulmonary:     Effort: Pulmonary effort is normal.     Breath sounds: Normal breath sounds. No wheezing, rhonchi or rales.  Abdominal:     General: Bowel sounds are normal. There is no distension.     Palpations: Abdomen is soft.     Tenderness: There is no abdominal tenderness. There is no guarding or rebound.  Musculoskeletal:         General: Normal range of motion.     Cervical back: Normal range of motion and neck supple.  Skin:    General: Skin is warm and dry.     Capillary Refill: Capillary refill takes less than 2 seconds.     Findings: No rash.  Neurological:     Mental Status: He is alert. Mental status is at baseline.     Gait: Gait normal.  Psychiatric:        Attention and Perception: Attention normal.        Mood and Affect: Mood and affect normal. Mood is not anxious or depressed.        Speech: Speech normal.        Behavior: Behavior normal. Behavior is cooperative.        Thought Content: Thought content normal. Thought content does not include homicidal or suicidal ideation. Thought content does not include homicidal or suicidal plan.        Assessment & Plan:   Problem List Items Addressed This Visit       Cardiovascular and Mediastinum   Hypertension (Chronic)    Has been better controlled in the past, didn't take meds today, will continue same med/dose - refills sent in yesterday BP Readings from Last 3 Encounters:  06/10/21 138/78  05/15/21 (!) 148/77  08/05/20 116/64     Chemistry      Component Value Date/Time   NA 137 05/15/2021 0936   NA 141 01/08/2015 1115   K 3.7 05/15/2021 0936   CL 100 05/15/2021 0936   CO2 29 05/15/2021 0936   BUN 12 05/15/2021 0936   BUN 13 01/08/2015 1115   CREATININE 0.96 05/15/2021 0936   CREATININE 0.91 08/05/2020 1015      Component Value Date/Time   CALCIUM 9.3 05/15/2021 0936   ALKPHOS 84 06/11/2016 0934   AST 27 08/05/2020 1015   ALT 28 08/05/2020 1015   BILITOT 0.6 08/05/2020 1015   BILITOT 0.3 01/08/2015 1115    Repeat labs and continue same dose - COMPLETE METABOLIC PANEL WITH GFR      Relevant Medications   lisinopril-hydrochlorothiazide (ZESTORETIC) 20-12.5 MG tablet   simvastatin (ZOCOR) 40 MG tablet   Other Relevant Orders   COMPLETE METABOLIC PANEL WITH GFR   Ectatic abdominal aorta (HCC)    On statin, monitoring,  advised to stop smoking Chart and prior imaging reviewed CT in 2015 with Dr. Sanda Klein - Ectatic abdominal aorta at risk for aneurysm development.  Recommend follow up by [place] in 5 years. This recommendation follows  ACR consensus guidelines: White Paper of the ACR Incidental Findings  Committee II on Vascular Findings. J Am Coll Radiol 2013;  10:789-794.  Due for f/up       Relevant Medications   lisinopril-hydrochlorothiazide (ZESTORETIC) 20-12.5 MG tablet   simvastatin (ZOCOR) 40 MG tablet   Other Relevant Orders   COMPLETE METABOLIC PANEL WITH GFR   Lipid panel     Respiratory   Chronic obstructive pulmonary disease ([place])  Not using inhalers, refuses referral - high risk with smoking hx and AAT deficiency  Recommended pulm consult/f/up and vaccinations Has nebulizer at home - using duonebs prn He agreed to restart daily inhaler, PFT's ordered - will see if we can get done outpt w/o pulm referral      Relevant Medications   SYMBICORT 160-4.5 MCG/ACT inhaler   Other Relevant Orders   Pulmonary Function Test [facility] Only     Endocrine   Hypothyroidism (Chronic)    Good med compliance, chronic fatigue and depressed mood, will recheck TSH and refill/adjust dose as needed       Relevant Orders   TSH     Other   Secondary erythrocytosis (Chronic)    Monitoring - pt has refused past referral and recommendations to stop smoking and f/up with specialists Hemoglobin  Date Value Ref Range Status  05/15/2021 17.5 (H) 13.0 - 17.0 g/dL Final  08/05/2020 18.1 (H) 13.2 - 17.1 g/dL Final  08/22/2019 17.5 (H) 13.2 - 17.1 g/dL Final  02/24/2019 17.9 (H) 13.2 - 17.1 g/dL Final   HGB  Date Value Ref Range Status  11/13/2013 18.3 (H) 13.0 - 18.0 g/dL Final  Advised pt he can donate blood to improve H/H      Relevant Orders   CBC with Differential/Platelet   Tobacco use (Chronic)    Worse - increased to 1.5 ppd, COPD on CXR, some sx including CP Urged pulm consult and smoking  cessation 40 year smoking hx, minimum 1ppd - recently increased so at least 40+ pack year hx      Relevant Orders   CBC with Differential/Platelet   Pulmonary Function Test [facility] Only   Ambulatory Referral Lung Cancer Screening [facility]   Hyperlipidemia (Chronic)    Last month he's taken half dose - so labs may be worse Otherwise he is usually Compliant with meds, no SE, no myalgias, fatigue or jaundice Will recheck lipids and recheck CMP Diet and exercise recommendations for HLD reviewed       Relevant Medications   lisinopril-hydrochlorothiazide (ZESTORETIC) 20-12.5 MG tablet   simvastatin (ZOCOR) 40 MG tablet   Other Relevant Orders   COMPLETE METABOLIC PANEL WITH GFR   Lipid panel   Bipolar disorder (HCC) (Chronic)    Anxiety sx worse, recent death of his mother, he cared for her, he is dealing with family and estate and more bills not on his own, PHQ 9 negative, GAD 7 worse - he wants to continue paxil 20 mg BID and continues to refuse psych      Vitamin D deficiency disease   Relevant Orders   COMPLETE METABOLIC PANEL WITH GFR   Agoraphobia with panic attacks    Previously referred to psych several times, pt refuses He misses appt due to anxiety/agoraphobia Encouraged him to go back to psych to allow him to function better in general We can do virtual visits when agoraphobia is too severe       Relevant Medications   PARoxetine (PAXIL) 20 MG tablet   B12 deficiency    Low end of normal last year - encourage him to take supplement - may help with fatigue sx Lab Results  Component Value Date   VITAMINB12 334 08/05/2020        Relevant Orders   CBC with Differential/Platelet   Other Visit Diagnoses     Essential hypertension    -  Primary   Relevant Medications   lisinopril-hydrochlorothiazide (ZESTORETIC) 20-12.5 MG tablet   simvastatin (ZOCOR)  40 MG tablet   Other Relevant Orders   COMPLETE METABOLIC PANEL WITH GFR   Encounter for medication  monitoring       Relevant Orders   COMPLETE METABOLIC PANEL WITH GFR   Lipid panel   CBC with Differential/Platelet   TSH   Hemoglobin A1c   Screening for colon cancer       Relevant Orders   Ambulatory referral to Gastroenterology   Chest pain, unspecified type       Relevant Orders   Pulmonary Function Test [facility] Only   Dyspnea, unspecified type       Relevant Orders   Pulmonary Function Test [facility] Only      ER visit reviewed, CXR, labs and ECG reviewed with pt today as well    Return in about 6 months (around 12/10/2021) for Routine follow-up.   Delsa Grana, PA-C 06/10/21 10:05 AM

## 2021-06-10 NOTE — Assessment & Plan Note (Signed)
Monitoring - pt has refused past referral and recommendations to stop smoking and f/up with specialists Hemoglobin  Date Value Ref Range Status  05/15/2021 17.5 (H) 13.0 - 17.0 g/dL Final  08/05/2020 18.1 (H) 13.2 - 17.1 g/dL Final  08/22/2019 17.5 (H) 13.2 - 17.1 g/dL Final  02/24/2019 17.9 (H) 13.2 - 17.1 g/dL Final   HGB  Date Value Ref Range Status  11/13/2013 18.3 (H) 13.0 - 18.0 g/dL Final   Advised pt he can donate blood to improve H/H

## 2021-06-10 NOTE — Telephone Encounter (Signed)
CALLED PATIENT NO ANSWER LEFT VOICEMAIL FOR A CALL BACK ? ?

## 2021-06-10 NOTE — Assessment & Plan Note (Signed)
Good med compliance, chronic fatigue and depressed mood, will recheck TSH and refill/adjust dose as needed

## 2021-06-10 NOTE — Assessment & Plan Note (Addendum)
On statin, monitoring, advised to stop smoking Chart and prior imaging reviewed CT in 2015 with Dr. Sanda Klein - Ectatic abdominal aorta at risk for aneurysm development.  Recommend follow up by [place] in 5 years. This recommendation follows  ACR consensus guidelines: White Paper of the ACR Incidental Findings  Committee II on Vascular Findings. J Am Coll Radiol 2013;  10:789-794.  Due for f/up

## 2021-06-10 NOTE — Assessment & Plan Note (Addendum)
Worse - increased to 1.5 ppd, COPD on CXR, some sx including CP Urged pulm consult and smoking cessation 40 year smoking hx, minimum 1ppd - recently increased so at least 40+ pack year hx

## 2021-06-11 ENCOUNTER — Telehealth: Payer: Self-pay

## 2021-06-11 LAB — CBC WITH DIFFERENTIAL/PLATELET
Absolute Monocytes: 698 cells/uL (ref 200–950)
Basophils Absolute: 107 cells/uL (ref 0–200)
Basophils Relative: 1.1 %
Eosinophils Absolute: 223 cells/uL (ref 15–500)
Eosinophils Relative: 2.3 %
HCT: 53.2 % — ABNORMAL HIGH (ref 38.5–50.0)
Hemoglobin: 17.9 g/dL — ABNORMAL HIGH (ref 13.2–17.1)
Lymphs Abs: 1959 cells/uL (ref 850–3900)
MCH: 28.6 pg (ref 27.0–33.0)
MCHC: 33.6 g/dL (ref 32.0–36.0)
MCV: 85 fL (ref 80.0–100.0)
MPV: 10.3 fL (ref 7.5–12.5)
Monocytes Relative: 7.2 %
Neutro Abs: 6712 cells/uL (ref 1500–7800)
Neutrophils Relative %: 69.2 %
Platelets: 398 10*3/uL (ref 140–400)
RBC: 6.26 10*6/uL — ABNORMAL HIGH (ref 4.20–5.80)
RDW: 13.7 % (ref 11.0–15.0)
Total Lymphocyte: 20.2 %
WBC: 9.7 10*3/uL (ref 3.8–10.8)

## 2021-06-11 LAB — COMPLETE METABOLIC PANEL WITH GFR
AG Ratio: 1.2 (calc) (ref 1.0–2.5)
ALT: 18 U/L (ref 9–46)
AST: 20 U/L (ref 10–35)
Albumin: 4.2 g/dL (ref 3.6–5.1)
Alkaline phosphatase (APISO): 111 U/L (ref 35–144)
BUN: 11 mg/dL (ref 7–25)
CO2: 29 mmol/L (ref 20–32)
Calcium: 9.9 mg/dL (ref 8.6–10.3)
Chloride: 100 mmol/L (ref 98–110)
Creat: 0.88 mg/dL (ref 0.70–1.35)
Globulin: 3.4 g/dL (calc) (ref 1.9–3.7)
Glucose, Bld: 109 mg/dL — ABNORMAL HIGH (ref 65–99)
Potassium: 4.3 mmol/L (ref 3.5–5.3)
Sodium: 138 mmol/L (ref 135–146)
Total Bilirubin: 0.5 mg/dL (ref 0.2–1.2)
Total Protein: 7.6 g/dL (ref 6.1–8.1)
eGFR: 97 mL/min/{1.73_m2} (ref >=60)

## 2021-06-11 LAB — TSH: TSH: 0.54 mIU/L (ref 0.40–4.50)

## 2021-06-11 LAB — HEMOGLOBIN A1C
Hgb A1c MFr Bld: 5.9 % of total Hgb — ABNORMAL HIGH (ref <5.7)
Mean Plasma Glucose: 123 mg/dL
eAG (mmol/L): 6.8 mmol/L

## 2021-06-11 LAB — LIPID PANEL
Cholesterol: 157 mg/dL (ref <200)
HDL: 33 mg/dL — ABNORMAL LOW (ref >=40)
LDL Cholesterol (Calc): 101 mg/dL (calc) — ABNORMAL HIGH
Non-HDL Cholesterol (Calc): 124 mg/dL (calc) (ref <130)
Total CHOL/HDL Ratio: 4.8 (calc) (ref <5.0)
Triglycerides: 132 mg/dL (ref <150)

## 2021-06-11 NOTE — Telephone Encounter (Signed)
CALLED PATIENT NO ANSWER LEFT VOICEMAIL FOR A CALL BACK ? ?

## 2021-06-12 ENCOUNTER — Telehealth: Payer: Self-pay

## 2021-06-12 NOTE — Telephone Encounter (Signed)
CALLED PATIENT NO ANSWER LEFT VOICEMAIL FOR A CALL BACK °Letter sent °

## 2021-07-05 DIAGNOSIS — Z419 Encounter for procedure for purposes other than remedying health state, unspecified: Secondary | ICD-10-CM | POA: Diagnosis not present

## 2021-08-05 DIAGNOSIS — Z419 Encounter for procedure for purposes other than remedying health state, unspecified: Secondary | ICD-10-CM | POA: Diagnosis not present

## 2021-08-26 DIAGNOSIS — H5213 Myopia, bilateral: Secondary | ICD-10-CM | POA: Diagnosis not present

## 2021-09-05 DIAGNOSIS — Z419 Encounter for procedure for purposes other than remedying health state, unspecified: Secondary | ICD-10-CM | POA: Diagnosis not present

## 2021-09-06 ENCOUNTER — Other Ambulatory Visit: Payer: Self-pay | Admitting: Family Medicine

## 2021-09-06 DIAGNOSIS — F4001 Agoraphobia with panic disorder: Secondary | ICD-10-CM

## 2021-09-09 ENCOUNTER — Other Ambulatory Visit: Payer: Self-pay | Admitting: Family Medicine

## 2021-09-09 DIAGNOSIS — F4001 Agoraphobia with panic disorder: Secondary | ICD-10-CM

## 2021-09-09 MED ORDER — PAROXETINE HCL 20 MG PO TABS: 20.0000 mg | ORAL_TABLET | Freq: Every day | ORAL | 3 refills | Status: DC

## 2021-09-09 NOTE — Telephone Encounter (Signed)
Pt states he has only been taking 1 pill daily, he misunderstood the instructions. Pt states he has been feeling fine taking 1 pill of '20mg'$  daily before bedtime. Pt states he agrees if needed to change the dose.

## 2021-09-09 NOTE — Telephone Encounter (Signed)
Called pt no answer left vm to return my call. 

## 2021-10-05 DIAGNOSIS — Z419 Encounter for procedure for purposes other than remedying health state, unspecified: Secondary | ICD-10-CM | POA: Diagnosis not present

## 2021-11-03 ENCOUNTER — Encounter (INDEPENDENT_AMBULATORY_CARE_PROVIDER_SITE_OTHER): Payer: Self-pay

## 2021-11-05 DIAGNOSIS — Z419 Encounter for procedure for purposes other than remedying health state, unspecified: Secondary | ICD-10-CM | POA: Diagnosis not present

## 2021-12-05 DIAGNOSIS — Z419 Encounter for procedure for purposes other than remedying health state, unspecified: Secondary | ICD-10-CM | POA: Diagnosis not present

## 2021-12-11 ENCOUNTER — Encounter: Payer: Self-pay | Admitting: Family Medicine

## 2021-12-11 ENCOUNTER — Ambulatory Visit: Payer: Medicaid Other | Admitting: Family Medicine

## 2021-12-11 VITALS — BP 138/82 | HR 84 | Temp 97.6°F | Resp 16 | Ht 70.0 in | Wt 211.6 lb

## 2021-12-11 DIAGNOSIS — J449 Chronic obstructive pulmonary disease, unspecified: Secondary | ICD-10-CM | POA: Diagnosis not present

## 2021-12-11 DIAGNOSIS — J029 Acute pharyngitis, unspecified: Secondary | ICD-10-CM

## 2021-12-11 DIAGNOSIS — Z23 Encounter for immunization: Secondary | ICD-10-CM | POA: Diagnosis not present

## 2021-12-11 DIAGNOSIS — R002 Palpitations: Secondary | ICD-10-CM

## 2021-12-11 DIAGNOSIS — R7303 Prediabetes: Secondary | ICD-10-CM

## 2021-12-11 DIAGNOSIS — E782 Mixed hyperlipidemia: Secondary | ICD-10-CM

## 2021-12-11 DIAGNOSIS — E039 Hypothyroidism, unspecified: Secondary | ICD-10-CM

## 2021-12-11 DIAGNOSIS — Z5181 Encounter for therapeutic drug level monitoring: Secondary | ICD-10-CM

## 2021-12-11 DIAGNOSIS — F320 Major depressive disorder, single episode, mild: Secondary | ICD-10-CM | POA: Diagnosis not present

## 2021-12-11 DIAGNOSIS — I10 Essential (primary) hypertension: Secondary | ICD-10-CM

## 2021-12-11 NOTE — Progress Notes (Signed)
Name: Tyler WEIGHTMAN Sr.   MRN: [id number]    DOB: 06-09-1960   Date:12/11/2021       Progress Note  Chief Complaint  Patient presents with   Hypertension   Hypothyroidism     Subjective:   Tyler TROIANI Sr. is a 61 y.o. male, presents to clinic for f/up on chronic conditions and he notes palpitations off and on for a few weeks and sore throat  Hypothyroid on levothyroxine -  He has been noting some palpitations which has happened previously with a higher dose of levothyroxine and which subsequently resolved with reducing the dose slightly he is currently taking 112 mcg daily last labs were within normal range  Hypertension:  Currently managed on lisinopril hydrochlorothiazide 20-12.5 Pt reports good med compliance and denies any SE.   Blood pressure today is fairly well controlled. BP Readings from Last 3 Encounters:  12/11/21 138/82  06/10/21 138/78  05/15/21 (!) 148/77   Pt denies CP, SOB, exertional sx, LE edema, palpitation, Ha's, visual disturbances, lightheadedness, hypotension, syncope. Dietary efforts for BP?  None  Severe mood disorder/depression and anxiety managed on only Paxil, he is repeatedly refused to consult with psychiatry for better management His mood appears about baseline today    12/11/2021    9:34 AM 06/10/2021    9:40 AM 08/05/2020    9:32 AM  Depression screen PHQ 2/9  Decreased Interest 0 0 1  Down, Depressed, Hopeless 0 0   PHQ - 2 Score 0 0 1  Altered sleeping 0 0 1  Tired, decreased energy 0 0 2  Change in appetite 0 0 1  Feeling bad or failure about yourself  0 0 1  Trouble concentrating 0 0 1  Moving slowly or fidgety/restless 0 0 1  Suicidal thoughts 0 0 0  PHQ-9 Score 0 0 8  Difficult doing work/chores Not difficult at all Not difficult at all Somewhat difficult      06/10/2021    9:41 AM 08/05/2020    9:35 AM 03/26/2020    9:21 AM 10/20/2019   11:59 AM  GAD 7 : Generalized Anxiety Score  Nervous, Anxious, on Edge '3 1 3 3   '$ Control/stop worrying '3 1 3 3  '$ Worry too much - different things 0 '1 3 3  '$ Trouble relaxing 3 0 2 1  Restless 0 0 0 0  Easily annoyed or irritable '1 1 1 2  '$ Afraid - awful might happen 3 1 0 0  Total GAD 7 Score '13 5 12 12  '$ Anxiety Difficulty Very difficult Somewhat difficult Somewhat difficult Somewhat difficult   COPD managed on Symbicort and DuoNebs but he rarely uses them  Hyperlipidemia managed on simvastatin 40 mg daily he endorses good compliance without any medication side effects or concerns    Current Outpatient Medications:    ipratropium-albuterol (DUONEB) 0.5-2.5 (3) MG/3ML SOLN, Take 3 mLs by nebulization 3 (three) times daily as needed., Disp: 180 mL, Rfl: 1   levothyroxine (SYNTHROID) 112 MCG tablet, TAKE 1 TABLET BY MOUTH EVERY DAY, Disp: 90 tablet, Rfl: 0   lisinopril-hydrochlorothiazide (ZESTORETIC) 20-12.5 MG tablet, Take 1 tablet by mouth daily., Disp: 90 tablet, Rfl: 3   PARoxetine (PAXIL) 20 MG tablet, Take 1 tablet (20 mg total) by mouth daily., Disp: 90 tablet, Rfl: 3   simvastatin (ZOCOR) 40 MG tablet, TAKE 1 TABLET BY MOUTH EVERYDAY AT BEDTIME, Disp: 90 tablet, Rfl: 3   SYMBICORT 160-4.5 MCG/ACT inhaler, Inhale 2 puffs into the lungs  2 (two) times daily., Disp: 3 each, Rfl: 3  Patient Active Problem List   Diagnosis Date Noted   Chronic obstructive pulmonary disease ([place]) 08/05/2020   Ectatic abdominal aorta ([place]) 08/15/2018   B12 deficiency 09/12/2017   Overweight (BMI 25.0-29.9) 07/02/2017   Herpes 37/04/8887   Umbilical hernia without obstruction and without gangrene 16/94/5038   Umbilical hernia 88/28/0034   Scoliosis 01/08/2015   Spinal stenosis, lumbar 01/08/2015   DDD (degenerative disc disease), lumbar 01/08/2015   Agoraphobia with panic attacks 08/24/2014   AAT (alpha-1-antitrypsin) deficiency ([place])    Tobacco use    Hyperlipidemia    Hypertension    Hypothyroidism    Thyroid cyst    Anxiety    Bipolar disorder ([place])    Vitamin D  deficiency disease    Insomnia    Secondary erythrocytosis 05/08/2014    Past Surgical History:  Procedure Laterality Date   COLONOSCOPY  2013   Dr. Orest Dikes 2    Family History  Problem Relation Age of Onset   Heart disease Father    Heart failure Father    Heart attack Father        x 12   Alcohol abuse Father    COPD Father    Dementia Mother    Hypertension Mother    Heart attack Brother    CAD Brother    Hypertension Brother    Alcohol abuse Paternal Grandfather    COPD Maternal Grandfather    Cancer Neg Hx    Diabetes Neg Hx    Stroke Neg Hx     Social History   Tobacco Use   Smoking status: Every Day    Packs/day: 1.50    Years: 40.00    Total pack years: 60.00    Types: Cigarettes   Smokeless tobacco: Never  Vaping Use   Vaping Use: Never used  Substance Use Topics   Alcohol use: No   Drug use: No     Allergies  Allergen Reactions   No Known Allergies     Health Maintenance  Topic Date Due   Zoster Vaccines- Shingrix (1 of 2) 03/12/2022 (Originally 04/02/2008)   Lung Cancer Screening  12/12/2022 (Originally 04/02/2008)   COLONOSCOPY (Pts 45-10yr Insurance coverage will need to be confirmed)  12/12/2022 (Originally 05/05/2021)   DTaP/Tdap/Td (2 - Td or Tdap) 01/02/2026   INFLUENZA VACCINE  Completed   Hepatitis C Screening  Completed   HIV Screening  Completed   HPV VACCINES  Aged Out   COVID-19 Vaccine  Discontinued    Chart Review Today: I personally reviewed active problem list, medication list, allergies, family history, social history, health maintenance, notes from last encounter, lab results, imaging with the patient/caregiver today.   Review of Systems  Constitutional: Negative.   HENT: Negative.    Eyes: Negative.   Respiratory: Negative.    Cardiovascular: Negative.   Gastrointestinal: Negative.   Endocrine: Negative.   Genitourinary: Negative.   Musculoskeletal: Negative.   Skin: Negative.   Allergic/Immunologic: Negative.    Neurological: Negative.   Hematological: Negative.   Psychiatric/Behavioral: Negative.    All other systems reviewed and are negative.    Objective:   Vitals:   12/11/21 0940  BP: 138/82  Pulse: 84  Resp: 16  Temp: 97.6 F (36.4 C)  TempSrc: Oral  SpO2: 94%  Weight: 211 lb 9.6 oz (96 kg)  Height: '5\' 10"'$  (1.778 m)    Body mass index is 30.36 kg/m.  Physical Exam Vitals  and nursing note reviewed.  Constitutional:      General: He is not in acute distress.    Appearance: Normal appearance. He is obese. He is not ill-appearing, toxic-appearing or diaphoretic.  HENT:     Head: Normocephalic and atraumatic.     Right Ear: External ear normal.     Left Ear: External ear normal.  Eyes:     General: No scleral icterus.       Right eye: No discharge.        Left eye: No discharge.     Conjunctiva/sclera: Conjunctivae normal.  Cardiovascular:     Rate and Rhythm: Normal rate and regular rhythm.     Pulses: Normal pulses.     Heart sounds: Normal heart sounds.  Pulmonary:     Effort: Pulmonary effort is normal.     Breath sounds: Normal breath sounds.  Abdominal:     General: Abdomen is protuberant. Bowel sounds are normal. There is no abdominal bruit.     Palpations: Abdomen is soft. There is no mass or pulsatile mass.     Tenderness: There is no abdominal tenderness. There is no guarding or rebound.  Musculoskeletal:     Right lower leg: No edema.     Left lower leg: No edema.  Skin:    General: Skin is warm and dry.     Coloration: Skin is not jaundiced or pale.     Findings: No lesion.  Neurological:     Mental Status: He is alert. Mental status is at baseline.  Psychiatric:        Attention and Perception: Attention normal.        Mood and Affect: Mood is depressed. Mood is not anxious. Affect is flat.        Speech: Speech normal.        Behavior: Behavior normal. Behavior is cooperative.        Thought Content: Thought content normal. Thought content does  not include homicidal or suicidal ideation. Thought content does not include homicidal or suicidal plan.         Assessment & Plan:   Problem List Items Addressed This Visit       Cardiovascular and Mediastinum   Hypertension - Primary (Chronic)    Blood pressure is borderline controlled he is compliant with lisinopril-hydrochlorothiazide No significant diet or lifestyle efforts BP Readings from Last 3 Encounters:  12/11/21 138/82  06/10/21 138/78  05/15/21 (!) 148/77          Respiratory   Chronic obstructive pulmonary disease (HCC)    Poor inhaler compliance, he does not want to do lung cancer screening CTs or treatment for his respiratory symptoms or diagnoses his meds were refilled and he was educated on the mechanism of action and how these can benefit him and reduce his symptoms        Endocrine   Hypothyroidism (Chronic)    Patient experiencing some episodes of palpitations which he is experienced in the past and symptoms resolved when levothyroxine dose was decreased EKG today unremarkable no significant changes from prior, no concerning arrhythmias Recheck TSH and adjust medications If TSH is in normal range I have recommended to the patient to do Holter monitor and follow-up with cardiology which he does not want to do      Relevant Orders   TSH (Completed)     Other   Hyperlipidemia (Chronic)    He endorses good medication compliance with his statin without side effects or  concerns, recent labs reviewed, continue simvastatin unless dose adjustments are needed with worsening lab work      Relevant Orders   COMPLETE METABOLIC PANEL WITH GFR (Completed)   Lipid panel (Completed)   Other Visit Diagnoses     Essential hypertension       Relevant Orders   COMPLETE METABOLIC PANEL WITH GFR (Completed)   Current mild episode of major depressive disorder, unspecified whether recurrent ([place])       Encounter for medication monitoring       Relevant Orders    COMPLETE METABOLIC PANEL WITH GFR (Completed)   Lipid panel (Completed)   Hemoglobin A1c (Completed)   Need for influenza vaccination       Relevant Orders   Flu Vaccine QUAD 63moIM (Fluarix, Fluzone & Alfiuria Quad PF) (Completed)   Prediabetes       Recheck labs, diet efforts continue to obesity   Relevant Orders   COMPLETE METABOLIC PANEL WITH GFR (Completed)   Hemoglobin A1c (Completed)   Palpitations       Episodes of palpitations, heart RRR today on exam, ECG done   Relevant Orders   COMPLETE METABOLIC PANEL WITH GFR (Completed)   TSH (Completed)   CBC w/Diff/Platelet (Completed)   EKG 12-Lead        Return in about 2 months (around 02/11/2022) for thyroid, BP, palpitations.   LDelsa Grana PA-C 12/11/21 10:00 AM

## 2021-12-12 ENCOUNTER — Other Ambulatory Visit: Payer: Self-pay | Admitting: Family Medicine

## 2021-12-12 DIAGNOSIS — E039 Hypothyroidism, unspecified: Secondary | ICD-10-CM

## 2021-12-12 LAB — COMPLETE METABOLIC PANEL WITH GFR
AG Ratio: 1.3 (calc) (ref 1.0–2.5)
ALT: 16 U/L (ref 9–46)
AST: 21 U/L (ref 10–35)
Albumin: 4.4 g/dL (ref 3.6–5.1)
Alkaline phosphatase (APISO): 97 U/L (ref 35–144)
BUN: 14 mg/dL (ref 7–25)
CO2: 27 mmol/L (ref 20–32)
Calcium: 9.8 mg/dL (ref 8.6–10.3)
Chloride: 101 mmol/L (ref 98–110)
Creat: 0.87 mg/dL (ref 0.70–1.35)
Globulin: 3.4 g/dL (calc) (ref 1.9–3.7)
Glucose, Bld: 93 mg/dL (ref 65–99)
Potassium: 4.3 mmol/L (ref 3.5–5.3)
Sodium: 139 mmol/L (ref 135–146)
Total Bilirubin: 0.4 mg/dL (ref 0.2–1.2)
Total Protein: 7.8 g/dL (ref 6.1–8.1)
eGFR: 97 mL/min/{1.73_m2} (ref >=60)

## 2021-12-12 LAB — CBC WITH DIFFERENTIAL/PLATELET
Absolute Monocytes: 831 cells/uL (ref 200–950)
Basophils Absolute: 105 cells/uL (ref 0–200)
Basophils Relative: 0.9 %
Eosinophils Absolute: 304 cells/uL (ref 15–500)
Eosinophils Relative: 2.6 %
HCT: 52.3 % — ABNORMAL HIGH (ref 38.5–50.0)
Hemoglobin: 17.5 g/dL — ABNORMAL HIGH (ref 13.2–17.1)
Lymphs Abs: 2153 cells/uL (ref 850–3900)
MCH: 28.8 pg (ref 27.0–33.0)
MCHC: 33.5 g/dL (ref 32.0–36.0)
MCV: 86.2 fL (ref 80.0–100.0)
MPV: 10.3 fL (ref 7.5–12.5)
Monocytes Relative: 7.1 %
Neutro Abs: 8307 cells/uL — ABNORMAL HIGH (ref 1500–7800)
Neutrophils Relative %: 71 %
Platelets: 409 10*3/uL — ABNORMAL HIGH (ref 140–400)
RBC: 6.07 10*6/uL — ABNORMAL HIGH (ref 4.20–5.80)
RDW: 13.8 % (ref 11.0–15.0)
Total Lymphocyte: 18.4 %
WBC: 11.7 10*3/uL — ABNORMAL HIGH (ref 3.8–10.8)

## 2021-12-12 LAB — HEMOGLOBIN A1C
Hgb A1c MFr Bld: 6.2 % of total Hgb — ABNORMAL HIGH (ref <5.7)
Mean Plasma Glucose: 131 mg/dL
eAG (mmol/L): 7.3 mmol/L

## 2021-12-12 LAB — LIPID PANEL
Cholesterol: 138 mg/dL (ref <200)
HDL: 35 mg/dL — ABNORMAL LOW (ref >=40)
LDL Cholesterol (Calc): 86 mg/dL (calc)
Non-HDL Cholesterol (Calc): 103 mg/dL (calc) (ref <130)
Total CHOL/HDL Ratio: 3.9 (calc) (ref <5.0)
Triglycerides: 80 mg/dL (ref <150)

## 2021-12-12 LAB — TSH: TSH: 0.2 mIU/L — ABNORMAL LOW (ref 0.40–4.50)

## 2021-12-12 MED ORDER — LEVOTHYROXINE SODIUM 100 MCG PO TABS: 100.0000 ug | ORAL_TABLET | Freq: Every day | ORAL | 0 refills | Status: DC

## 2021-12-15 NOTE — Assessment & Plan Note (Signed)
Patient experiencing some episodes of palpitations which he is experienced in the past and symptoms resolved when levothyroxine dose was decreased EKG today unremarkable no significant changes from prior, no concerning arrhythmias Recheck TSH and adjust medications If TSH is in normal range I have recommended to the patient to do Holter monitor and follow-up with cardiology which he does not want to do

## 2021-12-15 NOTE — Assessment & Plan Note (Signed)
He endorses good medication compliance with his statin without side effects or concerns, recent labs reviewed, continue simvastatin unless dose adjustments are needed with worsening lab work

## 2021-12-15 NOTE — Assessment & Plan Note (Signed)
Blood pressure is borderline controlled he is compliant with lisinopril-hydrochlorothiazide No significant diet or lifestyle efforts BP Readings from Last 3 Encounters:  12/11/21 138/82  06/10/21 138/78  05/15/21 (!) 148/77

## 2021-12-15 NOTE — Assessment & Plan Note (Signed)
Poor inhaler compliance, he does not want to do lung cancer screening CTs or treatment for his respiratory symptoms or diagnoses his meds were refilled and he was educated on the mechanism of action and how these can benefit him and reduce his symptoms

## 2022-01-05 DIAGNOSIS — Z419 Encounter for procedure for purposes other than remedying health state, unspecified: Secondary | ICD-10-CM | POA: Diagnosis not present

## 2022-01-27 NOTE — Progress Notes (Unsigned)
   Established Patient Office Visit  Subjective   Patient ID: Tyler Mcgrail., male    DOB: [birth year]  Age: 62 y.o. MRN: [id number]  No chief complaint on file.   HPI  Patient is here for concerns about thyroid. He is new to me.   Hypothyroidism: -Medications: Levothyroxine 100 mcg -Patient is compliant with the above medication (s) at the above dose and reports no medication side effects. *** -Denies weight changes, cold./heat intolerance, skin changes, anxiety/palpitations *** -Last TSH: 0.20 12/23  Hypertension: -Medications: Lisinopril-HCTZ 20-12.5 mg  -Patient is compliant with above medications and reports no side effects. -Checking BP at home (average): *** -Highest BP at home: *** -Lowest BP at home: *** -Denies any SOB, CP, vision changes, LE edema or symptoms of hypotension -Diet: *** -Exercise: ***    {History (Optional):23778}  ROS    Objective:     There were no vitals taken for this visit. {Vitals History (Optional):23777}  Physical Exam   No results found for any visits on 01/28/22.  {Labs (Optional):23779}  The 10-year ASCVD risk score (Arnett DK, et al., 2019) is: 19.5%    Assessment & Plan:   Problem List Items Addressed This Visit   None   No follow-ups on file.    Teodora Medici, DO

## 2022-01-28 ENCOUNTER — Encounter: Payer: Self-pay | Admitting: Internal Medicine

## 2022-01-28 ENCOUNTER — Ambulatory Visit (INDEPENDENT_AMBULATORY_CARE_PROVIDER_SITE_OTHER): Payer: Medicaid Other | Admitting: Internal Medicine

## 2022-01-28 VITALS — BP 135/70 | HR 89 | Temp 97.9°F | Resp 18 | Ht 70.0 in | Wt 211.7 lb

## 2022-01-28 DIAGNOSIS — E039 Hypothyroidism, unspecified: Secondary | ICD-10-CM

## 2022-01-28 DIAGNOSIS — I10 Essential (primary) hypertension: Secondary | ICD-10-CM | POA: Diagnosis not present

## 2022-01-28 NOTE — Patient Instructions (Signed)
It was great seeing you today!  Plan discussed at today's visit: -Blood work ordered today, results will be uploaded to [facility]. I will send you a message about recommendations regarding thyroid medication when the labs come back  Follow up in: keep June appointment but may require an appointment prior to that depending on labs   Take care and let us know if you have any questions or concerns prior to your next visit.  Dr. Rosana Berger

## 2022-01-29 LAB — THYROID PANEL WITH TSH
Free Thyroxine Index: 2.8 (ref 1.4–3.8)
T3 Uptake: 25 % (ref 22–35)
T4, Total: 11.2 ug/dL — ABNORMAL HIGH (ref 4.9–10.5)
TSH: 2.43 mIU/L (ref 0.40–4.50)

## 2022-02-03 ENCOUNTER — Other Ambulatory Visit: Payer: Self-pay | Admitting: Family Medicine

## 2022-02-03 DIAGNOSIS — E039 Hypothyroidism, unspecified: Secondary | ICD-10-CM

## 2022-02-05 DIAGNOSIS — Z419 Encounter for procedure for purposes other than remedying health state, unspecified: Secondary | ICD-10-CM | POA: Diagnosis not present

## 2022-03-06 DIAGNOSIS — Z419 Encounter for procedure for purposes other than remedying health state, unspecified: Secondary | ICD-10-CM | POA: Diagnosis not present

## 2022-04-06 DIAGNOSIS — Z419 Encounter for procedure for purposes other than remedying health state, unspecified: Secondary | ICD-10-CM | POA: Diagnosis not present

## 2022-05-06 DIAGNOSIS — Z419 Encounter for procedure for purposes other than remedying health state, unspecified: Secondary | ICD-10-CM | POA: Diagnosis not present

## 2022-05-31 ENCOUNTER — Other Ambulatory Visit: Payer: Self-pay | Admitting: Internal Medicine

## 2022-05-31 DIAGNOSIS — E039 Hypothyroidism, unspecified: Secondary | ICD-10-CM

## 2022-06-02 NOTE — Telephone Encounter (Signed)
Requested Prescriptions  Pending Prescriptions Disp Refills   levothyroxine (SYNTHROID) 100 MCG tablet [Pharmacy Med Name: LEVOTHYROXINE 100 MCG TABLET] 90 tablet 0    Sig: TAKE 1 TABLET BY MOUTH EVERY DAY BEFORE BREAKFAST     Endocrinology:  Hypothyroid Agents Passed - 05/31/2022 11:13 AM      Passed - TSH in normal range and within 360 days    TSH  Date Value Ref Range Status  01/28/2022 2.43 0.40 - 4.50 mIU/L Final         Passed - Valid encounter within last 12 months    Recent Outpatient Visits           4 months ago Hypothyroidism, unspecified type   [facility] Margarita Mail, DO   5 months ago Primary hypertension   [facility] Danelle Berry, PA-C   11 months ago Essential hypertension   [facility] Danelle Berry, PA-C   1 year ago Essential hypertension   [facility] Danelle Berry, PA-C   2 years ago Other specified hypothyroidism   [facility] Danelle Berry, PA-C       Future Appointments             In 1 week Danelle Berry, PA-C [facility]

## 2022-06-06 DIAGNOSIS — Z419 Encounter for procedure for purposes other than remedying health state, unspecified: Secondary | ICD-10-CM | POA: Diagnosis not present

## 2022-06-10 DIAGNOSIS — D582 Other hemoglobinopathies: Secondary | ICD-10-CM | POA: Insufficient documentation

## 2022-06-10 DIAGNOSIS — R7303 Prediabetes: Secondary | ICD-10-CM | POA: Insufficient documentation

## 2022-06-10 NOTE — Progress Notes (Unsigned)
Name: Tyler LUDEMANN Sr.   MRN: [id number]    DOB: 03/29/1960   Date:06/11/2022       Progress Note  Chief Complaint  Patient presents with   Follow-up   Hypertension   Hyperlipidemia   COPD   Hypothyroidism     Subjective:   Tyler Eves Sr. is a 62 y.o. male, presents to clinic for routine follow up  Hypothyroidism: History:  very sensitive to med dose changes  On 100 mcg daily -TSH was suppressed and his dose was decreased, as of last appointment he was still having some palpitation even though his labs had normalized since that time he feels even better with a lot less palpitations he denies sweats diarrhea increased anxiety or insomnia, weight loss Current Medication Regimen: 100 mcg synthroid  Most recent results are below; we will be repeating labs today. Lab Results  Component Value Date   TSH 2.43 01/28/2022   T4TOTAL 11.2 (H) 01/28/2022     Hypertension:  Currently managed on lisinopril HCTZ 20-12.5 Pt reports good med compliance and denies any SE.   Blood pressure today is well controlled. BP Readings from Last 3 Encounters:  06/11/22 126/70  01/28/22 135/70  12/11/21 138/82   Pt denies CP, SOB, exertional sx, LE edema, palpitation, Ha's, visual disturbances, lightheadedness, hypotension, syncope.   Hyperlipidemia: Currently treated with simvastatin, pt reports good med compliance Last Lipids: Labs done about 6 months ago and were fairly well-controlled, no changes since then Lab Results  Component Value Date   CHOL 138 12/11/2021   HDL 35 (L) 12/11/2021   LDLCALC 86 12/11/2021   TRIG 80 12/11/2021   CHOLHDL 3.9 12/11/2021   - Denies: Chest pain, shortness of breath, myalgias, claudication  Agoraphobia, mood disorder anxiety and depression he was previously on higher dose paroxetine but he states that for some reason he could not get this medication filled or covered at the pharmacy for some reason ever since last year (?)  He feels his symptoms  were well-controlled on the 40 mg dose in the past, he did not run out completely he has been able to get paroxetine and a lower dose from a family member    06/11/2022    8:53 AM 01/28/2022   10:14 AM 12/11/2021    9:34 AM  Depression screen PHQ 2/9  Decreased Interest 0 0 0  Down, Depressed, Hopeless 0 0 0  PHQ - 2 Score 0 0 0  Altered sleeping 0 0 0  Tired, decreased energy 0 0 0  Change in appetite 0 0 0  Feeling bad or failure about yourself  0 0 0  Trouble concentrating 0 0 0  Moving slowly or fidgety/restless 0 0 0  Suicidal thoughts 0 0 0  PHQ-9 Score 0 0 0  Difficult doing work/chores Not difficult at all Not difficult at all Not difficult at all      06/11/2022    8:57 AM 06/10/2021    9:41 AM 08/05/2020    9:35 AM 03/26/2020    9:21 AM  GAD 7 : Generalized Anxiety Score  Nervous, Anxious, on Edge 0 3 1 3   Control/stop worrying 0 3 1 3   Worry too much - different things 0 0 1 3  Trouble relaxing 0 3 0 2  Restless 0 0 0 0  Easily annoyed or irritable 0 1 1 1   Afraid - awful might happen 0 3 1 0  Total GAD 7 Score 0 13 5 12  Anxiety Difficulty Not difficult at all Very difficult Somewhat difficult Somewhat difficult  PHQ-9 and GAD-7 were reviewed and negative today he does state that his anxiety and agoraphobia is so bad that he did not sleep at all last night because of the appointment today causing worse symptoms He would like to go back on the higher dose paroxetine  Smokes currently 1 pack/day he also reports not getting his inhalers since last year he states he cannot notice a difference with his breathing with or without them and he has not had any acute respiratory illnesses or needed steroids or antibiotics in the past 6 to 12 months    Current Outpatient Medications:    levothyroxine (SYNTHROID) 100 MCG tablet, TAKE 1 TABLET BY MOUTH EVERY DAY BEFORE BREAKFAST, Disp: 90 tablet, Rfl: 0   lisinopril-hydrochlorothiazide (ZESTORETIC) 20-12.5 MG tablet, Take 1 tablet by  mouth daily., Disp: 90 tablet, Rfl: 3   simvastatin (ZOCOR) 40 MG tablet, TAKE 1 TABLET BY MOUTH EVERYDAY AT BEDTIME, Disp: 90 tablet, Rfl: 3   ipratropium-albuterol (DUONEB) 0.5-2.5 (3) MG/3ML SOLN, Take 3 mLs by nebulization 3 (three) times daily as needed. (Patient not taking: Reported on 06/11/2022), Disp: 180 mL, Rfl: 1   PARoxetine (PAXIL) 20 MG tablet, Take 1 tablet (20 mg total) by mouth daily. (Patient not taking: Reported on 06/11/2022), Disp: 90 tablet, Rfl: 3   SYMBICORT 160-4.5 MCG/ACT inhaler, Inhale 2 puffs into the lungs 2 (two) times daily. (Patient not taking: Reported on 06/11/2022), Disp: 3 each, Rfl: 3  Patient Active Problem List   Diagnosis Date Noted   Elevated hemoglobin (HCC) 06/10/2022   Prediabetes 06/10/2022   Chronic obstructive pulmonary disease (HCC) 08/05/2020   Ectatic abdominal aorta (HCC) 08/15/2018   B12 deficiency 09/12/2017   Overweight (BMI 25.0-29.9) 07/02/2017   Herpes 01/03/2016   Umbilical hernia without obstruction and without gangrene 04/22/2015   Umbilical hernia 04/09/2015   Scoliosis 01/08/2015   Spinal stenosis, lumbar 01/08/2015   DDD (degenerative disc disease), lumbar 01/08/2015   Agoraphobia with panic attacks 08/24/2014   AAT (alpha-1-antitrypsin) deficiency (HCC)    Tobacco use    Hyperlipidemia    Hypertension    Hypothyroidism    Thyroid cyst    Anxiety    Bipolar disorder (HCC)    Vitamin D deficiency disease    Insomnia    Secondary erythrocytosis 05/08/2014    Past Surgical History:  Procedure Laterality Date   COLONOSCOPY  2013   Dr. Geoffery Lyons 2    Family History  Problem Relation Age of Onset   Heart disease Father    Heart failure Father    Heart attack Father        x 5   Alcohol abuse Father    COPD Father    Dementia Mother    Hypertension Mother    Heart attack Brother    CAD Brother    Hypertension Brother    Alcohol abuse Paternal Grandfather    COPD Maternal Grandfather    Cancer Neg Hx     Diabetes Neg Hx    Stroke Neg Hx     Social History   Tobacco Use   Smoking status: Every Day    Packs/day: 1.50    Years: 40.00    Additional pack years: 0.00    Total pack years: 60.00    Types: Cigarettes   Smokeless tobacco: Never  Vaping Use   Vaping Use: Never used  Substance Use Topics   Alcohol use: No  Drug use: No     Allergies  Allergen Reactions   No Known Allergies     Health Maintenance  Topic Date Due   Zoster Vaccines- Shingrix (1 of 2) 09/11/2022 (Originally 04/02/2008)   Lung Cancer Screening  12/12/2022 (Originally 04/02/2008)   Colonoscopy  12/12/2022 (Originally 05/05/2021)   INFLUENZA VACCINE  08/06/2022   DTaP/Tdap/Td (2 - Td or Tdap) 01/02/2026   Hepatitis C Screening  Completed   HIV Screening  Completed   HPV VACCINES  Aged Out   COVID-19 Vaccine  Discontinued    Chart Review Today: I personally reviewed active problem list, medication list, allergies, family history, social history, health maintenance, notes from last encounter, lab results, imaging with the patient/caregiver today.   Review of Systems  Constitutional: Negative.   HENT: Negative.    Eyes: Negative.   Respiratory: Negative.    Cardiovascular: Negative.   Gastrointestinal: Negative.   Endocrine: Negative.   Genitourinary: Negative.   Musculoskeletal: Negative.   Skin: Negative.   Allergic/Immunologic: Negative.   Neurological: Negative.   Hematological: Negative.   Psychiatric/Behavioral: Negative.    All other systems reviewed and are negative.    Objective:   Vitals:   06/11/22 0856  BP: 126/70  Pulse: 86  Resp: 16  Temp: 97.9 F (36.6 C)  TempSrc: Oral  SpO2: 95%  Weight: 207 lb 1.6 oz (93.9 kg)  Height: 5\' 10"  (1.778 m)    Body mass index is 29.72 kg/m.  Physical Exam Vitals and nursing note reviewed.  Constitutional:      General: He is not in acute distress.    Appearance: Normal appearance. He is well-developed. He is not ill-appearing,  toxic-appearing or diaphoretic.     Comments: Obese abdomen with thinner extremities  HENT:     Head: Normocephalic and atraumatic.     Right Ear: External ear normal.     Left Ear: External ear normal.     Nose: Nose normal.     Mouth/Throat:     Pharynx: No oropharyngeal exudate.  Eyes:     General: No scleral icterus.       Right eye: No discharge.        Left eye: No discharge.     Conjunctiva/sclera: Conjunctivae normal.     Pupils: Pupils are equal, round, and reactive to light.  Neck:     Trachea: No tracheal deviation.  Cardiovascular:     Rate and Rhythm: Normal rate and regular rhythm.     Pulses: Normal pulses.     Heart sounds: Normal heart sounds. No murmur heard.    No friction rub. No gallop.  Pulmonary:     Effort: Pulmonary effort is normal. No respiratory distress.     Breath sounds: Normal breath sounds. No stridor. No wheezing, rhonchi or rales.  Chest:     Chest wall: No tenderness.  Abdominal:     General: Bowel sounds are normal. There is no distension.     Palpations: Abdomen is soft.     Tenderness: There is no abdominal tenderness. There is no guarding or rebound.  Musculoskeletal:     Cervical back: Normal range of motion.     Right lower leg: No edema.     Left lower leg: No edema.  Skin:    General: Skin is warm and dry.     Capillary Refill: Capillary refill takes less than 2 seconds.     Coloration: Skin is not pale.     Findings: No  rash.  Neurological:     Mental Status: He is alert. Mental status is at baseline.     Motor: No abnormal muscle tone.     Coordination: Coordination normal.  Psychiatric:        Mood and Affect: Mood normal.        Behavior: Behavior normal.         Assessment & Plan:   Problem List Items Addressed This Visit       Cardiovascular and Mediastinum   Hypertension - Primary (Chronic)    Stable, well-controlled on current medications, on lisinopril-HCTZ 20-12.5 milligrams daily He reports good  compliance without any side effects or concerns BP at goal today BP Readings from Last 3 Encounters:  06/11/22 126/70  01/28/22 135/70  12/11/21 138/82  Continue same med and diet/lifestyle efforts      Relevant Orders   Comprehensive metabolic panel   Ectatic abdominal aorta (HCC)    On statin        Respiratory   Chronic obstructive pulmonary disease (HCC)    He denies any day to day sx or recent exacerbations He has been out of all inhalers and nebulizer meds for over 6 months and he states he cannot tell a difference, only possibly some slight dyspnea on exertion when off of medicines        Endocrine   Hypothyroidism (Chronic)    Over the last several office visits his medication dose has been decreased because of suppressed TSH and he was slightly symptomatic Last labs normalized and patient was still having some palpitations however since December he has had less and less palpitations on his current 100 mcg of levothyroxine Recheck labs today      Relevant Orders   TSH     Other   Tobacco use (Chronic)    Current smoker at least 1 pack/day we discussed smoking cessation and medication and treatment/resources, at this time he does not desire to quit smoking Smoking cessation counseling was given Smoking cessation instruction/counseling given:  counseled patient on the dangers of tobacco use, advised patient to stop smoking, and reviewed strategies to maximize success More than 5 min today spent discussing meds options, support and resources should he wish to try to cut back or stop smoking       Hyperlipidemia (Chronic)    Last lipids were fairly well-controlled on simvastatin without any recent dose changes He endorses good med compliance without side effects or concerns Last lipids reviewed We will do lipids annually Patient to continue simvastatin 40 mg daily       Relevant Orders   Comprehensive metabolic panel   Bipolar disorder (HCC) (Chronic)    He  reports moods are managed and well-controlled with paroxetine he prefers the higher dose 40 mg      Relevant Medications   PARoxetine (PAXIL) 40 MG tablet   Agoraphobia with panic attacks   Relevant Medications   PARoxetine (PAXIL) 40 MG tablet   Elevated hemoglobin (HCC)    Monitoring Likely from smoking or obstructive sleep apnea      Relevant Orders   CBC with Differential/Platelet   Prediabetes    Lab Results  Component Value Date   HGBA1C 6.2 (H) 12/11/2021  Recheck A1c today, no current medications or diet efforts       Relevant Orders   Hemoglobin A1c   Current mild episode of major depressive disorder (HCC)       06/11/2022    8:53 AM 01/28/2022  10:14 AM 12/11/2021    9:34 AM  Depression screen PHQ 2/9  Decreased Interest 0 0 0  Down, Depressed, Hopeless 0 0 0  PHQ - 2 Score 0 0 0  Altered sleeping 0 0 0  Tired, decreased energy 0 0 0  Change in appetite 0 0 0  Feeling bad or failure about yourself  0 0 0  Trouble concentrating 0 0 0  Moving slowly or fidgety/restless 0 0 0  Suicidal thoughts 0 0 0  PHQ-9 Score 0 0 0  Difficult doing work/chores Not difficult at all Not difficult at all Not difficult at all  Pt prefers 40 mg paxil dose, he has not been getting for the past year - although its unclear why? I reviewed the [place] preferred drug list for Medicaid and they do cover generic paroxetine Medication was refilled at a 40 mg dose we will monitor with staff to see if there is a prior authorization needed and we will contact and work with the pharmacy if there is any issues I did encourage patient to let us know if he does not get his medications so we are aware      Relevant Medications   PARoxetine (PAXIL) 40 MG tablet   Other Visit Diagnoses     Encounter for medication monitoring       Relevant Orders   CBC with Differential/Platelet   Comprehensive metabolic panel   Hemoglobin A1c   TSH        Return in about 6 months (around  12/11/2022) for Routine Follow-up, Need CPE.   Danelle Berry, PA-C 06/11/22 9:20 AM

## 2022-06-11 ENCOUNTER — Encounter: Payer: Self-pay | Admitting: Family Medicine

## 2022-06-11 ENCOUNTER — Ambulatory Visit: Payer: Medicaid Other | Admitting: Family Medicine

## 2022-06-11 VITALS — BP 126/70 | HR 86 | Temp 97.9°F | Resp 16 | Ht 70.0 in | Wt 207.1 lb

## 2022-06-11 DIAGNOSIS — R7303 Prediabetes: Secondary | ICD-10-CM

## 2022-06-11 DIAGNOSIS — E782 Mixed hyperlipidemia: Secondary | ICD-10-CM | POA: Diagnosis not present

## 2022-06-11 DIAGNOSIS — D582 Other hemoglobinopathies: Secondary | ICD-10-CM

## 2022-06-11 DIAGNOSIS — E039 Hypothyroidism, unspecified: Secondary | ICD-10-CM

## 2022-06-11 DIAGNOSIS — F4001 Agoraphobia with panic disorder: Secondary | ICD-10-CM | POA: Diagnosis not present

## 2022-06-11 DIAGNOSIS — J449 Chronic obstructive pulmonary disease, unspecified: Secondary | ICD-10-CM

## 2022-06-11 DIAGNOSIS — F3131 Bipolar disorder, current episode depressed, mild: Secondary | ICD-10-CM | POA: Diagnosis not present

## 2022-06-11 DIAGNOSIS — Z5181 Encounter for therapeutic drug level monitoring: Secondary | ICD-10-CM | POA: Diagnosis not present

## 2022-06-11 DIAGNOSIS — I10 Essential (primary) hypertension: Secondary | ICD-10-CM

## 2022-06-11 DIAGNOSIS — E559 Vitamin D deficiency, unspecified: Secondary | ICD-10-CM

## 2022-06-11 DIAGNOSIS — F320 Major depressive disorder, single episode, mild: Secondary | ICD-10-CM | POA: Insufficient documentation

## 2022-06-11 DIAGNOSIS — I77811 Abdominal aortic ectasia: Secondary | ICD-10-CM | POA: Diagnosis not present

## 2022-06-11 DIAGNOSIS — E538 Deficiency of other specified B group vitamins: Secondary | ICD-10-CM

## 2022-06-11 DIAGNOSIS — Z720 Tobacco use: Secondary | ICD-10-CM

## 2022-06-11 MED ORDER — PAROXETINE HCL 40 MG PO TABS: 40.0000 mg | ORAL_TABLET | Freq: Every day | ORAL | 1 refills | Status: DC

## 2022-06-11 NOTE — Assessment & Plan Note (Signed)
Monitoring Likely from smoking or obstructive sleep apnea

## 2022-06-11 NOTE — Assessment & Plan Note (Signed)
On statin.

## 2022-06-11 NOTE — Assessment & Plan Note (Signed)
Over the last several office visits his medication dose has been decreased because of suppressed TSH and he was slightly symptomatic Last labs normalized and patient was still having some palpitations however since December he has had less and less palpitations on his current 100 mcg of levothyroxine Recheck labs today

## 2022-06-11 NOTE — Assessment & Plan Note (Signed)
He reports moods are managed and well-controlled with paroxetine he prefers the higher dose 40 mg

## 2022-06-11 NOTE — Assessment & Plan Note (Signed)
    06/11/2022    8:53 AM 01/28/2022   10:14 AM 12/11/2021    9:34 AM  Depression screen PHQ 2/9  Decreased Interest 0 0 0  Down, Depressed, Hopeless 0 0 0  PHQ - 2 Score 0 0 0  Altered sleeping 0 0 0  Tired, decreased energy 0 0 0  Change in appetite 0 0 0  Feeling bad or failure about yourself  0 0 0  Trouble concentrating 0 0 0  Moving slowly or fidgety/restless 0 0 0  Suicidal thoughts 0 0 0  PHQ-9 Score 0 0 0  Difficult doing work/chores Not difficult at all Not difficult at all Not difficult at all   Pt prefers 40 mg paxil dose, he has not been getting for the past year - although its unclear why? I reviewed the [place] preferred drug list for Medicaid and they do cover generic paroxetine Medication was refilled at a 40 mg dose we will monitor with staff to see if there is a prior authorization needed and we will contact and work with the pharmacy if there is any issues I did encourage patient to let us know if he does not get his medications so we are aware

## 2022-06-11 NOTE — Assessment & Plan Note (Signed)
Lab Results  Component Value Date   HGBA1C 6.2 (H) 12/11/2021  Recheck A1c today, no current medications or diet efforts

## 2022-06-11 NOTE — Assessment & Plan Note (Addendum)
Last lipids were fairly well-controlled on simvastatin without any recent dose changes He endorses good med compliance without side effects or concerns Last lipids reviewed We will do lipids annually Patient to continue simvastatin 40 mg daily

## 2022-06-11 NOTE — Assessment & Plan Note (Signed)
Stable, well-controlled on current medications, on lisinopril-HCTZ 20-12.5 milligrams daily He reports good compliance without any side effects or concerns BP at goal today BP Readings from Last 3 Encounters:  06/11/22 126/70  01/28/22 135/70  12/11/21 138/82   Continue same med and diet/lifestyle efforts

## 2022-06-11 NOTE — Assessment & Plan Note (Signed)
Current smoker at least 1 pack/day we discussed smoking cessation and medication and treatment/resources, at this time he does not desire to quit smoking Smoking cessation counseling was given Smoking cessation instruction/counseling given:  counseled patient on the dangers of tobacco use, advised patient to stop smoking, and reviewed strategies to maximize success More than 5 min today spent discussing meds options, support and resources should he wish to try to cut back or stop smoking

## 2022-06-11 NOTE — Assessment & Plan Note (Signed)
He denies any day to day sx or recent exacerbations He has been out of all inhalers and nebulizer meds for over 6 months and he states he cannot tell a difference, only possibly some slight dyspnea on exertion when off of medicines

## 2022-06-12 ENCOUNTER — Ambulatory Visit: Payer: Medicaid Other | Admitting: Family Medicine

## 2022-06-12 LAB — HEMOGLOBIN A1C
Hgb A1c MFr Bld: 6.1 % of total Hgb — ABNORMAL HIGH (ref <5.7)
Mean Plasma Glucose: 128 mg/dL
eAG (mmol/L): 7.1 mmol/L

## 2022-06-12 LAB — COMPREHENSIVE METABOLIC PANEL
AG Ratio: 1.3 (calc) (ref 1.0–2.5)
ALT: 17 U/L (ref 9–46)
AST: 20 U/L (ref 10–35)
Albumin: 4.3 g/dL (ref 3.6–5.1)
Alkaline phosphatase (APISO): 100 U/L (ref 35–144)
BUN: 9 mg/dL (ref 7–25)
CO2: 28 mmol/L (ref 20–32)
Calcium: 9.8 mg/dL (ref 8.6–10.3)
Chloride: 100 mmol/L (ref 98–110)
Creat: 1 mg/dL (ref 0.70–1.35)
Globulin: 3.3 g/dL (calc) (ref 1.9–3.7)
Glucose, Bld: 99 mg/dL (ref 65–99)
Potassium: 4.3 mmol/L (ref 3.5–5.3)
Sodium: 138 mmol/L (ref 135–146)
Total Bilirubin: 0.5 mg/dL (ref 0.2–1.2)
Total Protein: 7.6 g/dL (ref 6.1–8.1)

## 2022-06-12 LAB — CBC WITH DIFFERENTIAL/PLATELET
Absolute Monocytes: 778 cells/uL (ref 200–950)
Basophils Absolute: 101 cells/uL (ref 0–200)
Basophils Relative: 1 %
Eosinophils Absolute: 263 cells/uL (ref 15–500)
Eosinophils Relative: 2.6 %
HCT: 53 % — ABNORMAL HIGH (ref 38.5–50.0)
Hemoglobin: 17.4 g/dL — ABNORMAL HIGH (ref 13.2–17.1)
Lymphs Abs: 2293 cells/uL (ref 850–3900)
MCH: 27.9 pg (ref 27.0–33.0)
MCHC: 32.8 g/dL (ref 32.0–36.0)
MCV: 85.1 fL (ref 80.0–100.0)
MPV: 10.3 fL (ref 7.5–12.5)
Monocytes Relative: 7.7 %
Neutro Abs: 6666 cells/uL (ref 1500–7800)
Neutrophils Relative %: 66 %
Platelets: 407 10*3/uL — ABNORMAL HIGH (ref 140–400)
RBC: 6.23 10*6/uL — ABNORMAL HIGH (ref 4.20–5.80)
RDW: 13.7 % (ref 11.0–15.0)
Total Lymphocyte: 22.7 %
WBC: 10.1 10*3/uL (ref 3.8–10.8)

## 2022-06-12 LAB — TSH: TSH: 3.17 mIU/L (ref 0.40–4.50)

## 2022-06-13 ENCOUNTER — Other Ambulatory Visit: Payer: Self-pay | Admitting: Family Medicine

## 2022-06-13 DIAGNOSIS — E782 Mixed hyperlipidemia: Secondary | ICD-10-CM

## 2022-06-13 DIAGNOSIS — I10 Essential (primary) hypertension: Secondary | ICD-10-CM

## 2022-06-13 DIAGNOSIS — E039 Hypothyroidism, unspecified: Secondary | ICD-10-CM

## 2022-06-18 ENCOUNTER — Other Ambulatory Visit: Payer: Self-pay | Admitting: Family Medicine

## 2022-06-18 DIAGNOSIS — E039 Hypothyroidism, unspecified: Secondary | ICD-10-CM

## 2022-06-18 MED ORDER — LEVOTHYROXINE SODIUM 100 MCG PO TABS
ORAL_TABLET | ORAL | 1 refills | Status: DC
Start: 2022-06-18 — End: 2022-12-16

## 2022-07-06 DIAGNOSIS — Z419 Encounter for procedure for purposes other than remedying health state, unspecified: Secondary | ICD-10-CM | POA: Diagnosis not present

## 2022-07-17 ENCOUNTER — Encounter: Payer: Medicaid Other | Admitting: Family Medicine

## 2022-07-17 DIAGNOSIS — Z0000 Encounter for general adult medical examination without abnormal findings: Secondary | ICD-10-CM

## 2022-07-17 DIAGNOSIS — Z125 Encounter for screening for malignant neoplasm of prostate: Secondary | ICD-10-CM

## 2022-08-06 DIAGNOSIS — Z419 Encounter for procedure for purposes other than remedying health state, unspecified: Secondary | ICD-10-CM | POA: Diagnosis not present

## 2022-09-06 DIAGNOSIS — Z419 Encounter for procedure for purposes other than remedying health state, unspecified: Secondary | ICD-10-CM | POA: Diagnosis not present

## 2022-10-06 DIAGNOSIS — Z419 Encounter for procedure for purposes other than remedying health state, unspecified: Secondary | ICD-10-CM | POA: Diagnosis not present

## 2022-11-06 DIAGNOSIS — Z419 Encounter for procedure for purposes other than remedying health state, unspecified: Secondary | ICD-10-CM | POA: Diagnosis not present

## 2022-11-11 IMAGING — CR DG CHEST 2V
1 series · 2 of 2 positions shown · non-contrast
Comparison: Chest radiograph 05/11/2013

CLINICAL DATA: Chest pain starting 3 weeks ago, intermittent, some
shortness of breath and mild nausea

EXAM:
CHEST - 2 VIEW

[Series 1: dg chest 2 view · 0.14mm/px · 2 of 2 slices shown]
[im 1/2]
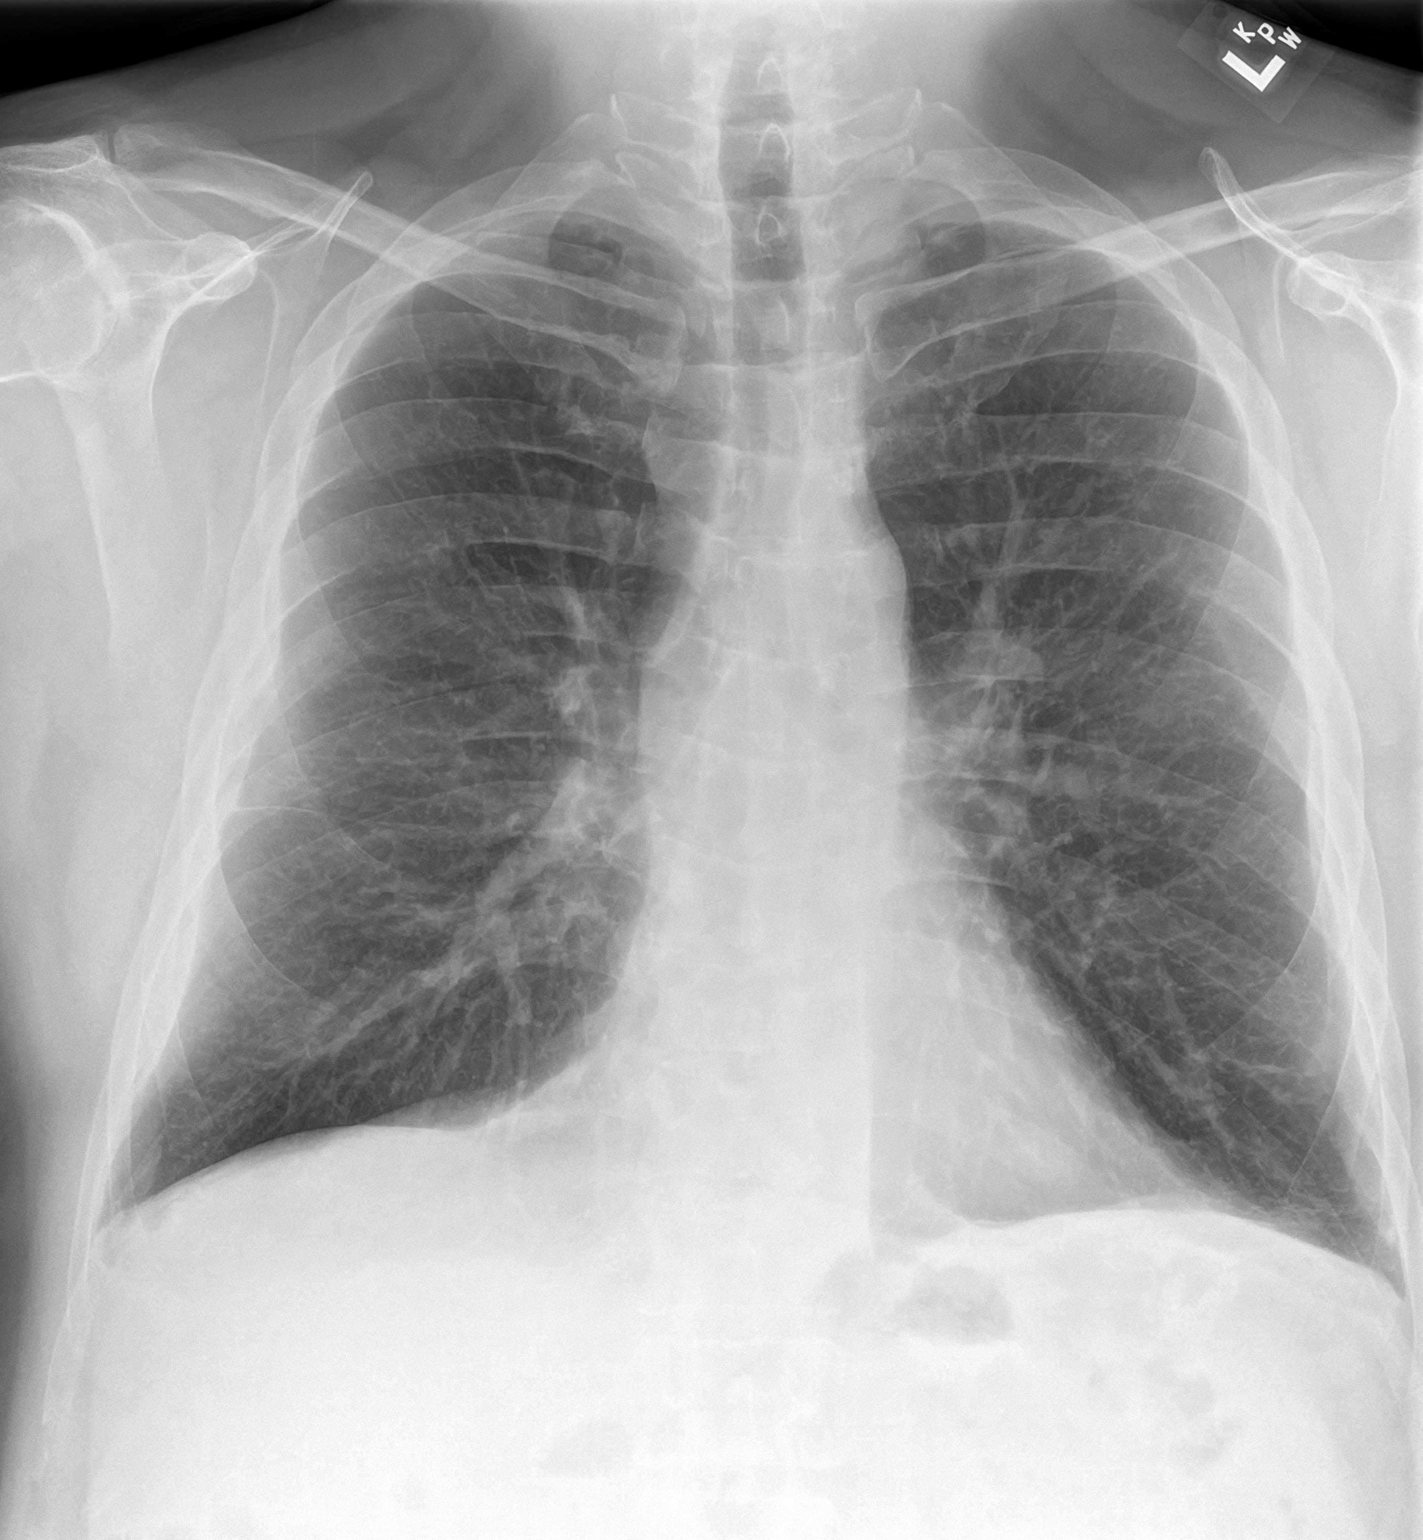
[im 2/2]
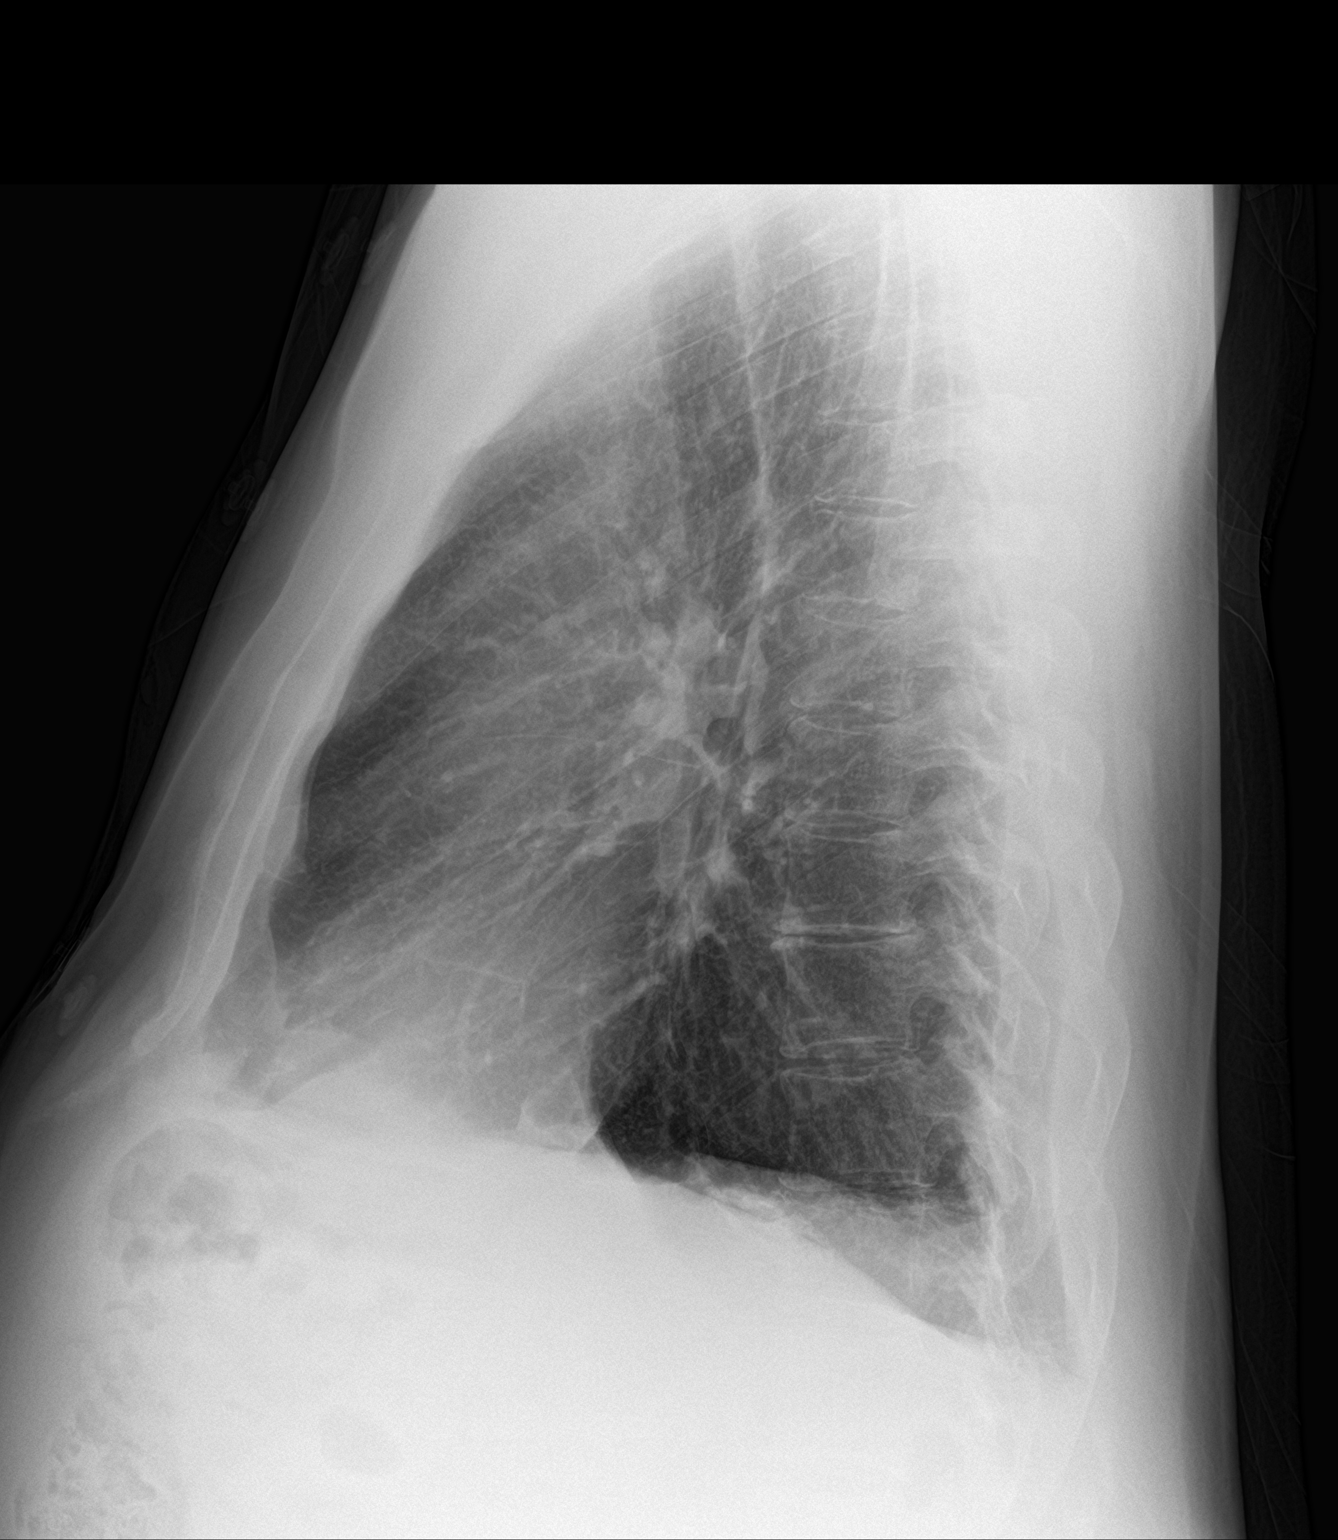

[2 of 2 positions shown; findings below may reference images not displayed]

FINDINGS: The cardiomediastinal silhouette is normal.

The lungs hyperinflated with flattening of the diaphragms suggesting
underlying COPD. There is no focal consolidation or pulmonary edema.
There is no pleural effusion or pneumothorax. Pleural thickening
along the right chest wall is unchanged since 6922.

There is no acute osseous abnormality.
IMPRESSION: Hyperinflated lungs suggesting underlying COPD. No radiographic
evidence of acute cardiopulmonary process.

## 2022-11-20 ENCOUNTER — Other Ambulatory Visit: Payer: Self-pay | Admitting: Family Medicine

## 2022-11-20 DIAGNOSIS — F3131 Bipolar disorder, current episode depressed, mild: Secondary | ICD-10-CM

## 2022-11-20 DIAGNOSIS — F4001 Agoraphobia with panic disorder: Secondary | ICD-10-CM

## 2022-11-20 DIAGNOSIS — F320 Major depressive disorder, single episode, mild: Secondary | ICD-10-CM

## 2022-12-06 DIAGNOSIS — Z419 Encounter for procedure for purposes other than remedying health state, unspecified: Secondary | ICD-10-CM | POA: Diagnosis not present

## 2022-12-18 ENCOUNTER — Ambulatory Visit: Payer: Medicaid Other | Admitting: Physician Assistant

## 2022-12-18 VITALS — BP 130/74 | HR 82 | Resp 16 | Ht 70.0 in | Wt 205.0 lb

## 2022-12-18 DIAGNOSIS — F3131 Bipolar disorder, current episode depressed, mild: Secondary | ICD-10-CM | POA: Diagnosis not present

## 2022-12-18 DIAGNOSIS — F17210 Nicotine dependence, cigarettes, uncomplicated: Secondary | ICD-10-CM

## 2022-12-18 DIAGNOSIS — Z1211 Encounter for screening for malignant neoplasm of colon: Secondary | ICD-10-CM | POA: Diagnosis not present

## 2022-12-18 DIAGNOSIS — F4001 Agoraphobia with panic disorder: Secondary | ICD-10-CM | POA: Diagnosis not present

## 2022-12-18 DIAGNOSIS — E782 Mixed hyperlipidemia: Secondary | ICD-10-CM

## 2022-12-18 DIAGNOSIS — E559 Vitamin D deficiency, unspecified: Secondary | ICD-10-CM

## 2022-12-18 DIAGNOSIS — Z23 Encounter for immunization: Secondary | ICD-10-CM | POA: Diagnosis not present

## 2022-12-18 DIAGNOSIS — Z720 Tobacco use: Secondary | ICD-10-CM

## 2022-12-18 DIAGNOSIS — F320 Major depressive disorder, single episode, mild: Secondary | ICD-10-CM

## 2022-12-18 DIAGNOSIS — I10 Essential (primary) hypertension: Secondary | ICD-10-CM

## 2022-12-18 DIAGNOSIS — R7303 Prediabetes: Secondary | ICD-10-CM

## 2022-12-18 DIAGNOSIS — J449 Chronic obstructive pulmonary disease, unspecified: Secondary | ICD-10-CM

## 2022-12-18 DIAGNOSIS — E039 Hypothyroidism, unspecified: Secondary | ICD-10-CM

## 2022-12-18 MED ORDER — PAROXETINE HCL 20 MG PO TABS: 20.0000 mg | ORAL_TABLET | Freq: Every day | ORAL | 1 refills | Status: DC

## 2022-12-18 MED ORDER — SIMVASTATIN 40 MG PO TABS
ORAL_TABLET | ORAL | 1 refills | Status: AC
Start: 2022-12-18 — End: ?

## 2022-12-18 MED ORDER — LISINOPRIL-HYDROCHLOROTHIAZIDE 20-12.5 MG PO TABS
1.0000 | ORAL_TABLET | Freq: Every day | ORAL | 1 refills | Status: DC
Start: 2022-12-18 — End: 2023-05-24

## 2022-12-18 MED ORDER — LEVOTHYROXINE SODIUM 100 MCG PO TABS: ORAL_TABLET | ORAL | 1 refills | Status: DC

## 2022-12-18 MED ORDER — ALBUTEROL SULFATE HFA 108 (90 BASE) MCG/ACT IN AERS: 2.0000 | INHALATION_SPRAY | Freq: Four times a day (QID) | RESPIRATORY_TRACT | 0 refills | Status: DC | PRN

## 2022-12-18 MED ORDER — SYMBICORT 160-4.5 MCG/ACT IN AERO: 2.0000 | INHALATION_SPRAY | Freq: Two times a day (BID) | RESPIRATORY_TRACT | 1 refills | Status: DC

## 2022-12-18 NOTE — Progress Notes (Unsigned)
Established Patient Office Visit  Name: Tyler REDA Sr.   MRN: [id number]    DOB: 22-Aug-1960   Date:12/21/2022  Today's Provider: Jacquelin Hawking, MHS, PA-C Introduced myself to the patient as a PA-C and provided education on APPs in clinical practice.         Subjective  Chief Complaint  Chief Complaint  Patient presents with   Medical Management of Chronic Issues    6 month f/u    HPI  HYPERTENSION / HYPERLIPIDEMIA Satisfied with current treatment? yes Duration of hypertension: years BP monitoring frequency: not checking BP range:  BP medication side effects: no  BP meds: lisinopril-hydrochlorothiazide 20-12.5 mg PO QD Duration of hyperlipidemia: years Cholesterol medication side effects: no Cholesterol supplements: none  cholesterol medications: simvastatin (zocor) Medication compliance: good compliance Aspirin: no Recent stressors: yes Recurrent headaches: yes- reports these got worse when he ran out of Paxil  Visual changes: no Palpitations: no Dyspnea: no Chest pain: no Lower extremity edema: no Dizzy/lightheaded: no   COPD COPD status: stable Satisfied with current treatment?: he never go inhalers  Oxygen use: no Dyspnea frequency: denies dyspnea but states he has congestion a lot  Cough frequency: off and on all day  Rescue inhaler frequency:  does not have rescue inhaler  Limitation of activity: no Productive cough: sometimes has clear sputum  Last Spirometry:  Pneumovax: unknown Influenza: Up to Date  Tobacco use He is interested in quitting Currently smoking about a pack per day    Mood  Reports he ran out of Paxil  States he has had a headache from withdrawal  He would like to restart paxil at 20 mg PO every day States his depression symptoms have increased and he has been more angry   HYPOTHYROIDISM Thyroid control status:stable Satisfied with current treatment? yes Medication side effects: no Medication compliance: good  compliance Etiology of hypothyroidism:  Recent dose adjustment:no Fatigue: yes- has tired spells  Cold intolerance: no Heat intolerance: no Weight gain: no Weight loss: no Constipation: no Diarrhea/loose stools: no Palpitations: no Lower extremity edema: no Anxiety/depressed mood: yes      12/18/2022    8:44 AM 06/11/2022    8:53 AM 01/28/2022   10:14 AM 12/11/2021    9:34 AM 06/10/2021    9:40 AM  Depression screen PHQ 2/9  Decreased Interest 0 0 0 0 0  Down, Depressed, Hopeless 0 0 0 0 0  PHQ - 2 Score 0 0 0 0 0  Altered sleeping 0 0 0 0 0  Tired, decreased energy 0 0 0 0 0  Change in appetite 0 0 0 0 0  Feeling bad or failure about yourself  0 0 0 0 0  Trouble concentrating 0 0 0 0 0  Moving slowly or fidgety/restless 0 0 0 0 0  Suicidal thoughts 0 0 0 0 0  PHQ-9 Score 0 0 0 0 0  Difficult doing work/chores  Not difficult at all Not difficult at all Not difficult at all Not difficult at all       06/11/2022    8:57 AM 06/10/2021    9:41 AM 08/05/2020    9:35 AM 03/26/2020    9:21 AM  GAD 7 : Generalized Anxiety Score  Nervous, Anxious, on Edge 0 3 1 3   Control/stop worrying 0 3 1 3   Worry too much - different things 0 0 1 3  Trouble relaxing 0 3 0 2  Restless 0  0 0 0  Easily annoyed or irritable 0 1 1 1   Afraid - awful might happen 0 3 1 0  Total GAD 7 Score 0 13 5 12   Anxiety Difficulty Not difficult at all Very difficult Somewhat difficult Somewhat difficult      Patient Active Problem List   Diagnosis Date Noted   Current mild episode of major depressive disorder (HCC) 06/11/2022   Elevated hemoglobin (HCC) 06/10/2022   Prediabetes 06/10/2022   Chronic obstructive pulmonary disease (HCC) 08/05/2020   Ectatic abdominal aorta (HCC) 08/15/2018   B12 deficiency 09/12/2017   Overweight (BMI 25.0-29.9) 07/02/2017   Herpes 01/03/2016   Umbilical hernia without obstruction and without gangrene 04/22/2015   Umbilical hernia 04/09/2015   Scoliosis 01/08/2015    Spinal stenosis, lumbar 01/08/2015   DDD (degenerative disc disease), lumbar 01/08/2015   Agoraphobia with panic attacks 08/24/2014   AAT (alpha-1-antitrypsin) deficiency (HCC)    Tobacco use    Hyperlipidemia    Hypertension    Hypothyroidism    Thyroid cyst    Anxiety    Bipolar disorder (HCC)    Vitamin D deficiency disease    Insomnia    Secondary erythrocytosis 05/08/2014    Past Surgical History:  Procedure Laterality Date   COLONOSCOPY  2013   Dr. Geoffery Lyons 2    Family History  Problem Relation Age of Onset   Heart disease Father    Heart failure Father    Heart attack Father        x 5   Alcohol abuse Father    COPD Father    Dementia Mother    Hypertension Mother    Heart attack Brother    CAD Brother    Hypertension Brother    Alcohol abuse Paternal Grandfather    COPD Maternal Grandfather    Cancer Neg Hx    Diabetes Neg Hx    Stroke Neg Hx     Social History   Tobacco Use   Smoking status: Every Day    Current packs/day: 1.50    Average packs/day: 1.5 packs/day for 40.0 years (60.0 ttl pk-yrs)    Types: Cigarettes   Smokeless tobacco: Never  Substance Use Topics   Alcohol use: No     Current Outpatient Medications:    albuterol (VENTOLIN HFA) 108 (90 Base) MCG/ACT inhaler, Inhale 2 puffs into the lungs every 6 (six) hours as needed for wheezing or shortness of breath., Disp: 8 g, Rfl: 0   ipratropium-albuterol (DUONEB) 0.5-2.5 (3) MG/3ML SOLN, Take 3 mLs by nebulization 3 (three) times daily as needed. (Patient not taking: Reported on 12/18/2022), Disp: 180 mL, Rfl: 1   levothyroxine (SYNTHROID) 100 MCG tablet, TAKE 1 TABLET BY MOUTH EVERY DAY BEFORE BREAKFAST, Disp: 90 tablet, Rfl: 1   lisinopril-hydrochlorothiazide (ZESTORETIC) 20-12.5 MG tablet, Take 1 tablet by mouth daily., Disp: 90 tablet, Rfl: 1   PARoxetine (PAXIL) 20 MG tablet, Take 1 tablet (20 mg total) by mouth daily., Disp: 90 tablet, Rfl: 1   simvastatin (ZOCOR) 40 MG tablet, TAKE  1 TABLET BY MOUTH EVERYDAY AT BEDTIME, Disp: 90 tablet, Rfl: 1   SYMBICORT 160-4.5 MCG/ACT inhaler, Inhale 2 puffs into the lungs 2 (two) times daily., Disp: 3 each, Rfl: 1  Allergies  Allergen Reactions   No Known Allergies     I personally reviewed active problem list, medication list, allergies, health maintenance, lab results with the patient/caregiver today.   ROS  See HPI for relevant ROS   Objective  Vitals:   12/18/22 0844  BP: 130/74  Pulse: 82  Resp: 16  SpO2: 98%  Weight: 205 lb (93 kg)  Height: 5\' 10"  (1.778 m)    Body mass index is 29.41 kg/m.  Physical Exam Vitals reviewed.  Constitutional:      General: He is awake.     Appearance: Normal appearance. He is well-developed and well-groomed.  HENT:     Head: Normocephalic and atraumatic.  Cardiovascular:     Rate and Rhythm: Normal rate and regular rhythm.     Pulses: Normal pulses.          Radial pulses are 2+ on the right side and 2+ on the left side.     Heart sounds: Normal heart sounds. No murmur heard.    No friction rub. No gallop.  Pulmonary:     Effort: Pulmonary effort is normal.     Breath sounds: Decreased air movement present. Decreased breath sounds present. No wheezing, rhonchi or rales.  Musculoskeletal:     Cervical back: Normal range of motion.     Right lower leg: No edema.     Left lower leg: No edema.  Neurological:     General: No focal deficit present.     Mental Status: He is alert and oriented to person, place, and time. Mental status is at baseline.     GCS: GCS eye subscore is 4. GCS verbal subscore is 5. GCS motor subscore is 6.  Psychiatric:        Attention and Perception: Attention and perception normal.        Mood and Affect: Mood normal. Affect is flat.        Speech: Speech normal.        Behavior: Behavior is slowed. Behavior is cooperative.        Thought Content: Thought content normal.        Cognition and Memory: Cognition normal.      Recent Results  (from the past 2160 hours)  Lipid Profile     Status: Abnormal   Collection Time: 12/18/22  9:16 AM  Result Value Ref Range   Cholesterol 132 <200 mg/dL   HDL 34 (L) > OR = 40 mg/dL   Triglycerides 80 <161 mg/dL   LDL Cholesterol (Calc) 82 mg/dL (calc)    Comment: Reference range: <100 . Desirable range <100 mg/dL for primary prevention;   <70 mg/dL for patients with CHD or diabetic patients  with > or = 2 CHD risk factors. Marland Kitchen LDL-C is now calculated using the Martin-Hopkins  calculation, which is a validated novel method providing  better accuracy than the Friedewald equation in the  estimation of LDL-C.  Horald Pollen et al. Lenox Ahr. 0960;454(09): 2061-2068  (http://education.[facility].com/faq/FAQ164)    Total CHOL/HDL Ratio 3.9 <5.0 (calc)   Non-HDL Cholesterol (Calc) 98 <811 mg/dL (calc)    Comment: For patients with diabetes plus 1 major ASCVD risk  factor, treating to a non-HDL-C goal of <100 mg/dL  (LDL-C of <91 mg/dL) is considered a therapeutic  option.   HgB A1c     Status: Abnormal   Collection Time: 12/18/22  9:16 AM  Result Value Ref Range   Hgb A1c MFr Bld 6.1 (H) <5.7 % of total Hgb    Comment: For someone without known diabetes, a hemoglobin  A1c value between 5.7% and 6.4% is consistent with prediabetes and should be confirmed with a  follow-up test. . For someone with known diabetes, a value <7% indicates  that their diabetes is well controlled. A1c targets should be individualized based on duration of diabetes, age, comorbid conditions, and other considerations. . This assay result is consistent with an increased risk of diabetes. . Currently, no consensus exists regarding use of hemoglobin A1c for diagnosis of diabetes for children. .    Mean Plasma Glucose 128 mg/dL   eAG (mmol/L) 7.1 mmol/L  CBC with Differential/Platelet     Status: Abnormal   Collection Time: 12/18/22  9:16 AM  Result Value Ref Range   WBC 10.7 3.8 - 10.8 Thousand/uL   RBC  6.29 (H) 4.20 - 5.80 Million/uL   Hemoglobin 17.7 (H) 13.2 - 17.1 g/dL   HCT 62.1 (H) 30.8 - 65.7 %   MCV 85.1 80.0 - 100.0 fL   MCH 28.1 27.0 - 33.0 pg   MCHC 33.1 32.0 - 36.0 g/dL    Comment: For adults, a slight decrease in the calculated MCHC value (in the range of 30 to 32 g/dL) is most likely not clinically significant; however, it should be interpreted with caution in correlation with other red cell parameters and the patient's clinical condition.    RDW 13.4 11.0 - 15.0 %   Platelets 414 (H) 140 - 400 Thousand/uL   MPV 10.5 7.5 - 12.5 fL   Neutro Abs 6,902 1,500 - 7,800 cells/uL   Absolute Lymphocytes 2,654 850 - 3,900 cells/uL   Absolute Monocytes 674 200 - 950 cells/uL   Eosinophils Absolute 364 15 - 500 cells/uL   Basophils Absolute 107 0 - 200 cells/uL   Neutrophils Relative % 64.5 %   Total Lymphocyte 24.8 %   Monocytes Relative 6.3 %   Eosinophils Relative 3.4 %   Basophils Relative 1.0 %  COMPLETE METABOLIC PANEL WITH GFR     Status: None   Collection Time: 12/18/22  9:16 AM  Result Value Ref Range   Glucose, Bld 99 65 - 99 mg/dL    Comment: .            Fasting reference interval .    BUN 11 7 - 25 mg/dL   Creat 8.46 9.62 - 9.52 mg/dL   eGFR 84 > OR = 60 WU/XLK/4.40N0   BUN/Creatinine Ratio SEE NOTE: 6 - 22 (calc)    Comment:    Not Reported: BUN and Creatinine are within    reference range. .    Sodium 137 135 - 146 mmol/L   Potassium 4.2 3.5 - 5.3 mmol/L   Chloride 100 98 - 110 mmol/L   CO2 25 20 - 32 mmol/L   Calcium 9.7 8.6 - 10.3 mg/dL   Total Protein 7.5 6.1 - 8.1 g/dL   Albumin 4.3 3.6 - 5.1 g/dL   Globulin 3.2 1.9 - 3.7 g/dL (calc)   AG Ratio 1.3 1.0 - 2.5 (calc)   Total Bilirubin 0.4 0.2 - 1.2 mg/dL   Alkaline phosphatase (APISO) 117 35 - 144 U/L   AST 22 10 - 35 U/L   ALT 19 9 - 46 U/L  Vitamin D (25 hydroxy)     Status: Abnormal   Collection Time: 12/18/22  9:16 AM  Result Value Ref Range   Vit D, 25-Hydroxy 28 (L) 30 - 100 ng/mL     Comment: Vitamin D Status         25-OH Vitamin D: . Deficiency:                    <20 ng/mL Insufficiency:  20 - 29 ng/mL Optimal:                 > or = 30 ng/mL . For 25-OH Vitamin D testing on patients on  D2-supplementation and patients for whom quantitation  of D2 and D3 fractions is required, the [facility](TM) 25-OH VIT D, (D2,D3), LC/MS/MS is recommended: order  code 82956 (patients >54yrs). . See Note 1 . Note 1 . For additional information, please refer to  http://education.[facility].com/faq/FAQ199  (This link is being provided for informational/ educational purposes only.)   TSH     Status: None   Collection Time: 12/18/22  9:16 AM  Result Value Ref Range   TSH 3.08 0.40 - 4.50 mIU/L     PHQ2/9:    12/18/2022    8:44 AM 06/11/2022    8:53 AM 01/28/2022   10:14 AM 12/11/2021    9:34 AM 06/10/2021    9:40 AM  Depression screen PHQ 2/9  Decreased Interest 0 0 0 0 0  Down, Depressed, Hopeless 0 0 0 0 0  PHQ - 2 Score 0 0 0 0 0  Altered sleeping 0 0 0 0 0  Tired, decreased energy 0 0 0 0 0  Change in appetite 0 0 0 0 0  Feeling bad or failure about yourself  0 0 0 0 0  Trouble concentrating 0 0 0 0 0  Moving slowly or fidgety/restless 0 0 0 0 0  Suicidal thoughts 0 0 0 0 0  PHQ-9 Score 0 0 0 0 0  Difficult doing work/chores  Not difficult at all Not difficult at all Not difficult at all Not difficult at all      Fall Risk:    12/18/2022    8:44 AM 06/11/2022    8:52 AM 01/28/2022   10:12 AM 12/11/2021    9:34 AM 06/10/2021    9:39 AM  Fall Risk   Falls in the past year? 0 0 0 0 0  Number falls in past yr: 0 0 0 0 0  Injury with Fall? 0 0 0 0 0  Risk for fall due to : No Fall Risks No Fall Risks  No Fall Risks No Fall Risks  Follow up Falls prevention discussed Falls prevention discussed;Education provided;Falls evaluation completed  Falls prevention discussed;Education provided;Falls evaluation completed Falls prevention  discussed;Education provided      Functional Status Survey:      Assessment & Plan  Problem List Items Addressed This Visit       Cardiovascular and Mediastinum   Hypertension - Primary (Chronic)   Chronic, historic condition Appears well managed on current regimen comprised of  lisinopril-hydrochlorothiazide 20-12.5 mg PO every day Continue current regimen, Refills provided today Recommend checking BP at home regularly for monitoring Follow up in 6 months or sooner if concerns arise        Relevant Medications   lisinopril-hydrochlorothiazide (ZESTORETIC) 20-12.5 MG tablet   simvastatin (ZOCOR) 40 MG tablet     Respiratory   Chronic obstructive pulmonary disease (HCC)   Chronic, historic condition  Appears well managed on current regimen - reports persistent congestion but denies activity limitations  Will send in Symbicort inhaler and rescue for management.  He is interested in quitting smoking -encouraged him to taper down as tolerated  Follow up in 6 months or sooner if concerns arise        Relevant Medications   SYMBICORT 160-4.5 MCG/ACT inhaler   albuterol (VENTOLIN HFA) 108 (90 Base) MCG/ACT inhaler  Endocrine   Hypothyroidism (Chronic)   Chronic, historic condition Currently taking Levothyroxine 100 mcg PO every day and appears to be tolerating well Recheck TSH today  Results to dictate further management  Continue current regimen for now Follow up in 6 months or sooner if concerns arise        Relevant Medications   levothyroxine (SYNTHROID) 100 MCG tablet   Other Relevant Orders   TSH (Completed)     Other   Tobacco use (Chronic)   Chronic use of cigarettes  He is currently smoking about 1 pack per day and is interested in quitting.  Discussed cessation options, gradual taper to cessation, helpline, nicotine replacement, impact of smoking on the body. Total conversation was approx 4 minutes.  Follow up as needed       Hyperlipidemia  (Chronic)   Chronic, historic condition Appears well managed on current regimen comprised of Simvastatin  Continue current regimen Recheck lipid panel today. Results to dictate further management . Follow up in 6 months or sooner if concerns arise        Relevant Medications   lisinopril-hydrochlorothiazide (ZESTORETIC) 20-12.5 MG tablet   simvastatin (ZOCOR) 40 MG tablet   Other Relevant Orders   Lipid Profile (Completed)   CBC with Differential/Platelet (Completed)   Bipolar disorder (HCC) (Chronic)   Chronic, historic condition Reports some mood symptoms since he ran out of paxil  He would like to restart it at 20 mg PO every day rather than 40 mg as he reports increased side effects at higher dose Refills provided today PHQ9 results reviewed today - score of 0 despite patient reports of ongoing mood concerns due to lack of medication.  Follow up in 6 months or sooner if concerns arise        Relevant Medications   PARoxetine (PAXIL) 20 MG tablet   Vitamin D deficiency disease   Check Vitamin D levels Results to dictate further management        Relevant Orders   Vitamin D (25 hydroxy) (Completed)   Agoraphobia with panic attacks   Relevant Medications   PARoxetine (PAXIL) 20 MG tablet   Prediabetes   Reports some increased thirst Recheck A1c today Results to dictate further management        Relevant Orders   HgB A1c (Completed)   COMPLETE METABOLIC PANEL WITH GFR (Completed)   Other Visit Diagnoses       Colon cancer screening       Relevant Orders   Ambulatory referral to Gastroenterology     Need for influenza vaccination            Return in about 6 months (around 06/18/2023) for HLD, HTN, prediabetes, COPD, mood .   I, Joda Braatz E Abdon Petrosky, PA-C, have reviewed all documentation for this visit. The documentation on 12/21/22 for the exam, diagnosis, procedures, and orders are all accurate and complete.   Jacquelin Hawking, MHS, PA-C [facility]

## 2022-12-18 NOTE — Assessment & Plan Note (Signed)
Reports some increased thirst Recheck A1c today Results to dictate further management

## 2022-12-19 LAB — COMPLETE METABOLIC PANEL WITH GFR
AG Ratio: 1.3 (calc) (ref 1.0–2.5)
ALT: 19 U/L (ref 9–46)
AST: 22 U/L (ref 10–35)
Albumin: 4.3 g/dL (ref 3.6–5.1)
Alkaline phosphatase (APISO): 117 U/L (ref 35–144)
BUN: 11 mg/dL (ref 7–25)
CO2: 25 mmol/L (ref 20–32)
Calcium: 9.7 mg/dL (ref 8.6–10.3)
Chloride: 100 mmol/L (ref 98–110)
Creat: 1 mg/dL (ref 0.70–1.35)
Globulin: 3.2 g/dL (ref 1.9–3.7)
Glucose, Bld: 99 mg/dL (ref 65–99)
Potassium: 4.2 mmol/L (ref 3.5–5.3)
Sodium: 137 mmol/L (ref 135–146)
Total Bilirubin: 0.4 mg/dL (ref 0.2–1.2)
Total Protein: 7.5 g/dL (ref 6.1–8.1)
eGFR: 84 mL/min/{1.73_m2} (ref >=60)

## 2022-12-19 LAB — LIPID PANEL
Cholesterol: 132 mg/dL (ref <200)
HDL: 34 mg/dL — ABNORMAL LOW (ref >=40)
LDL Cholesterol (Calc): 82 mg/dL
Non-HDL Cholesterol (Calc): 98 mg/dL (ref <130)
Total CHOL/HDL Ratio: 3.9 (calc) (ref <5.0)
Triglycerides: 80 mg/dL (ref <150)

## 2022-12-19 LAB — HEMOGLOBIN A1C
Hgb A1c MFr Bld: 6.1 %{Hb} — ABNORMAL HIGH (ref <5.7)
Mean Plasma Glucose: 128 mg/dL
eAG (mmol/L): 7.1 mmol/L

## 2022-12-19 LAB — CBC WITH DIFFERENTIAL/PLATELET
Absolute Lymphocytes: 2654 {cells}/uL (ref 850–3900)
Absolute Monocytes: 674 {cells}/uL (ref 200–950)
Basophils Absolute: 107 {cells}/uL (ref 0–200)
Basophils Relative: 1 %
Eosinophils Absolute: 364 {cells}/uL (ref 15–500)
Eosinophils Relative: 3.4 %
HCT: 53.5 % — ABNORMAL HIGH (ref 38.5–50.0)
Hemoglobin: 17.7 g/dL — ABNORMAL HIGH (ref 13.2–17.1)
MCH: 28.1 pg (ref 27.0–33.0)
MCHC: 33.1 g/dL (ref 32.0–36.0)
MCV: 85.1 fL (ref 80.0–100.0)
MPV: 10.5 fL (ref 7.5–12.5)
Monocytes Relative: 6.3 %
Neutro Abs: 6902 {cells}/uL (ref 1500–7800)
Neutrophils Relative %: 64.5 %
Platelets: 414 10*3/uL — ABNORMAL HIGH (ref 140–400)
RBC: 6.29 10*6/uL — ABNORMAL HIGH (ref 4.20–5.80)
RDW: 13.4 % (ref 11.0–15.0)
Total Lymphocyte: 24.8 %
WBC: 10.7 10*3/uL (ref 3.8–10.8)

## 2022-12-19 LAB — TSH: TSH: 3.08 m[IU]/L (ref 0.40–4.50)

## 2022-12-19 LAB — VITAMIN D 25 HYDROXY (VIT D DEFICIENCY, FRACTURES): Vit D, 25-Hydroxy: 28 ng/mL — ABNORMAL LOW (ref 30–100)

## 2022-12-21 MED ORDER — VITAMIN D (ERGOCALCIFEROL) 1.25 MG (50000 UNIT) PO CAPS: 50000.0000 [IU] | ORAL_CAPSULE | ORAL | 0 refills | Status: DC

## 2022-12-21 NOTE — Assessment & Plan Note (Signed)
Check Vitamin D levels Results to dictate further management

## 2022-12-21 NOTE — Assessment & Plan Note (Signed)
Chronic, historic condition Currently taking Levothyroxine 100 mcg PO every day and appears to be tolerating well Recheck TSH today  Results to dictate further management  Continue current regimen for now Follow up in 6 months or sooner if concerns arise

## 2022-12-21 NOTE — Addendum Note (Signed)
Addended by: Jacquelin Hawking on: 12/21/2022 04:15 PM   Modules accepted: Orders

## 2022-12-21 NOTE — Assessment & Plan Note (Signed)
Chronic use of cigarettes  He is currently smoking about 1 pack per day and is interested in quitting.  Discussed cessation options, gradual taper to cessation, helpline, nicotine replacement, impact of smoking on the body. Total conversation was approx 4 minutes.  Follow up as needed

## 2022-12-21 NOTE — Assessment & Plan Note (Signed)
Chronic, historic condition Appears well managed on current regimen comprised of  lisinopril-hydrochlorothiazide 20-12.5 mg PO every day Continue current regimen, Refills provided today Recommend checking BP at home regularly for monitoring Follow up in 6 months or sooner if concerns arise

## 2022-12-21 NOTE — Assessment & Plan Note (Addendum)
Chronic, historic condition Reports some mood symptoms since he ran out of paxil  He would like to restart it at 20 mg PO every day rather than 40 mg as he reports increased side effects at higher dose Refills provided today PHQ9 results reviewed today - score of 0 despite patient reports of ongoing mood concerns due to lack of medication.  Follow up in 6 months or sooner if concerns arise

## 2022-12-21 NOTE — Progress Notes (Signed)
Your labs are back Your cholesterol looks good.  Please continue to take your medications as directed Your A1c was 6.1% which is in the prediabetic range. No medications are indicated at this time but I do recommend reducing your sugar and carb intake and making sure you are exercising regularly Your CBC appears overall stable compared to previous results.  No signs of anemia Your vitamin D remains low.  I have sent in a vitamin D supplement for you to take once per week to help improve this.  We can recheck this in 12 weeks. Your electrolytes, liver and kidney function are overall normal Your thyroid testing was in normal range.  Please continue to take your medications as directed.  Please let us know if you have further questions or concerns

## 2022-12-21 NOTE — Assessment & Plan Note (Signed)
Chronic, historic condition Appears well managed on current regimen comprised of Simvastatin  Continue current regimen Recheck lipid panel today. Results to dictate further management . Follow up in 6 months or sooner if concerns arise

## 2022-12-21 NOTE — Assessment & Plan Note (Signed)
Chronic, historic condition  Appears well managed on current regimen - reports persistent congestion but denies activity limitations  Will send in Symbicort inhaler and rescue for management.  He is interested in quitting smoking -encouraged him to taper down as tolerated  Follow up in 6 months or sooner if concerns arise

## 2022-12-24 ENCOUNTER — Encounter: Payer: Self-pay | Admitting: *Deleted

## 2023-01-06 DIAGNOSIS — Z419 Encounter for procedure for purposes other than remedying health state, unspecified: Secondary | ICD-10-CM | POA: Diagnosis not present

## 2023-01-10 ENCOUNTER — Other Ambulatory Visit: Payer: Self-pay | Admitting: Physician Assistant

## 2023-01-10 DIAGNOSIS — J449 Chronic obstructive pulmonary disease, unspecified: Secondary | ICD-10-CM

## 2023-01-12 NOTE — Telephone Encounter (Signed)
 Requested Prescriptions  Pending Prescriptions Disp Refills   albuterol  (VENTOLIN  HFA) 108 (90 Base) MCG/ACT inhaler [Pharmacy Med Name: VENTOLIN  HFA 90 MCG INHALER] 18 each 2    Sig: TAKE 2 PUFFS BY MOUTH EVERY 6 HOURS AS NEEDED FOR WHEEZE OR SHORTNESS OF BREATH     Pulmonology:  Beta Agonists 2 Passed - 01/12/2023  3:55 PM      Passed - Last BP in normal range    BP Readings from Last 1 Encounters:  12/18/22 130/74         Passed - Last Heart Rate in normal range    Pulse Readings from Last 1 Encounters:  12/18/22 82         Passed - Valid encounter within last 12 months    Recent Outpatient Visits           3 weeks ago Primary hypertension   [facility] Mecum, Rocky BRAVO, PA-C   7 months ago Primary hypertension   [facility] Leavy Mole, PA-C   11 months ago Hypothyroidism, unspecified type   [facility] Bernardo Fend, DO   1 year ago Primary hypertension   [facility] Leavy Mole, PA-C   1 year ago Essential hypertension   [facility] Leavy Mole, PA-C       Future Appointments             In 5 months Mecum, Erin E, PA-C [facility]

## 2023-05-20 ENCOUNTER — Other Ambulatory Visit: Payer: Self-pay | Admitting: Physician Assistant

## 2023-05-20 ENCOUNTER — Other Ambulatory Visit: Payer: Self-pay | Admitting: Family Medicine

## 2023-05-20 DIAGNOSIS — E039 Hypothyroidism, unspecified: Secondary | ICD-10-CM

## 2023-05-20 DIAGNOSIS — I10 Essential (primary) hypertension: Secondary | ICD-10-CM

## 2023-05-24 NOTE — Telephone Encounter (Signed)
 Requested Prescriptions  Pending Prescriptions Disp Refills   lisinopril -hydrochlorothiazide  (ZESTORETIC ) 20-12.5 MG tablet [Pharmacy Med Name: LISINOPRIL -HCTZ 20-12.5 MG TAB] 90 tablet 0    Sig: TAKE 1 TABLET BY MOUTH EVERY DAY     Cardiovascular:  ACEI + Diuretic Combos Failed - 05/24/2023 11:11 AM      Failed - Valid encounter within last 6 months    Recent Outpatient Visits   None     Future Appointments             In 3 weeks Adeline Hone, PA-C [facility]            Passed - Na in normal range and within 180 days    Sodium  Date Value Ref Range Status  12/18/2022 137 135 - 146 mmol/L Final  01/08/2015 141 134 - 144 mmol/L Final         Passed - K in normal range and within 180 days    Potassium  Date Value Ref Range Status  12/18/2022 4.2 3.5 - 5.3 mmol/L Final         Passed - Cr in normal range and within 180 days    Creat  Date Value Ref Range Status  12/18/2022 1.00 0.70 - 1.35 mg/dL Final         Passed - eGFR is 30 or above and within 180 days    GFR, Est African American  Date Value Ref Range Status  03/26/2020 98 > OR = 60 mL/min/1.24m2 Final   GFR, Est Non African American  Date Value Ref Range Status  03/26/2020 85 > OR = 60 mL/min/1.81m2 Final   GFR, Estimated  Date Value Ref Range Status  05/15/2021 >60 >60 mL/min Final    Comment:    (NOTE) Calculated using the CKD-EPI Creatinine Equation (2021)    eGFR  Date Value Ref Range Status  12/18/2022 84 > OR = 60 mL/min/1.5m2 Final         Passed - Patient is not pregnant      Passed - Last BP in normal range    BP Readings from Last 1 Encounters:  12/18/22 130/74         Refused Prescriptions Disp Refills   levothyroxine  (SYNTHROID ) 100 MCG tablet [Pharmacy Med Name: LEVOTHYROXINE  100 MCG TABLET] 90 tablet     Sig: TAKE 1 TABLET BY MOUTH EVERY DAY BEFORE BREAKFAST     Endocrinology:  Hypothyroid Agents Failed - 05/24/2023 11:11 AM      Failed -  Valid encounter within last 12 months    Recent Outpatient Visits   None     Future Appointments             In 3 weeks Adeline Hone, PA-C [facility]            Passed - TSH in normal range and within 360 days    TSH  Date Value Ref Range Status  12/18/2022 3.08 0.40 - 4.50 mIU/L Final

## 2023-05-24 NOTE — Telephone Encounter (Signed)
 Requested Prescriptions  Pending Prescriptions Disp Refills   levothyroxine  (SYNTHROID ) 100 MCG tablet [Pharmacy Med Name: LEVOTHYROXINE  100 MCG TABLET] 90 tablet 0    Sig: TAKE 1 TABLET BY MOUTH EVERY DAY BEFORE BREAKFAST     Endocrinology:  Hypothyroid Agents Failed - 05/24/2023  8:41 AM      Failed - Valid encounter within last 12 months    Recent Outpatient Visits   None     Future Appointments             In 3 weeks Adeline Hone, PA-C [facility]            Passed - TSH in normal range and within 360 days    TSH  Date Value Ref Range Status  12/18/2022 3.08 0.40 - 4.50 mIU/L Final

## 2023-06-11 ENCOUNTER — Encounter: Payer: Self-pay | Admitting: Internal Medicine

## 2023-06-18 ENCOUNTER — Ambulatory Visit: Payer: Self-pay | Admitting: Family Medicine

## 2023-06-18 ENCOUNTER — Encounter: Payer: Self-pay | Admitting: Family Medicine

## 2023-06-18 ENCOUNTER — Encounter: Payer: Self-pay | Admitting: Internal Medicine

## 2023-06-18 VITALS — BP 134/80 | HR 71 | Temp 98.2°F | Ht 70.0 in | Wt 204.9 lb

## 2023-06-18 DIAGNOSIS — J449 Chronic obstructive pulmonary disease, unspecified: Secondary | ICD-10-CM

## 2023-06-18 DIAGNOSIS — Z1211 Encounter for screening for malignant neoplasm of colon: Secondary | ICD-10-CM

## 2023-06-18 DIAGNOSIS — D582 Other hemoglobinopathies: Secondary | ICD-10-CM

## 2023-06-18 DIAGNOSIS — F3131 Bipolar disorder, current episode depressed, mild: Secondary | ICD-10-CM

## 2023-06-18 DIAGNOSIS — E8801 Alpha-1-antitrypsin deficiency: Secondary | ICD-10-CM

## 2023-06-18 DIAGNOSIS — E559 Vitamin D deficiency, unspecified: Secondary | ICD-10-CM

## 2023-06-18 DIAGNOSIS — E039 Hypothyroidism, unspecified: Secondary | ICD-10-CM

## 2023-06-18 DIAGNOSIS — R7303 Prediabetes: Secondary | ICD-10-CM | POA: Diagnosis not present

## 2023-06-18 DIAGNOSIS — F4001 Agoraphobia with panic disorder: Secondary | ICD-10-CM | POA: Diagnosis not present

## 2023-06-18 DIAGNOSIS — I10 Essential (primary) hypertension: Secondary | ICD-10-CM

## 2023-06-18 DIAGNOSIS — E782 Mixed hyperlipidemia: Secondary | ICD-10-CM | POA: Diagnosis not present

## 2023-06-18 MED ORDER — LEVOTHYROXINE SODIUM 100 MCG PO TABS
ORAL_TABLET | ORAL | 1 refills | Status: AC
Start: 2023-06-18 — End: ?

## 2023-06-18 MED ORDER — SIMVASTATIN 40 MG PO TABS: ORAL_TABLET | ORAL | 1 refills | Status: DC

## 2023-06-18 MED ORDER — PAROXETINE HCL 20 MG PO TABS: 20.0000 mg | ORAL_TABLET | Freq: Every day | ORAL | 1 refills | Status: DC

## 2023-06-18 MED ORDER — LISINOPRIL-HYDROCHLOROTHIAZIDE 20-12.5 MG PO TABS: 1.0000 | ORAL_TABLET | Freq: Every day | ORAL | 1 refills | Status: DC

## 2023-06-18 NOTE — Patient Instructions (Signed)
 Return for a separate visit to discuss your joint pain concerns  You need a Medicare well visit   And we do need to see you again in 6 months for you routine follow up   Health Maintenance  Topic Date Due   Medicare Annual Wellness Visit  Never done   Pneumococcal Vaccine for age over 47 (1 of 2 - PCV) Never done   Colon Cancer Screening  05/05/2021   Zoster (Shingles) Vaccine (1 of 2) 09/17/2023*   Screening for Lung Cancer  06/16/2024*   Flu Shot  08/06/2023   DTaP/Tdap/Td vaccine (2 - Td or Tdap) 01/02/2026   Hepatitis C Screening  Completed   HIV Screening  Completed   HPV Vaccine  Aged Out   Meningitis B Vaccine  Aged Out   COVID-19 Vaccine  Discontinued  *Topic was postponed. The date shown is not the original due date.

## 2023-06-18 NOTE — Assessment & Plan Note (Signed)
 Last vitamin D  Lab Results  Component Value Date   VD25OH 28 (L) 12/18/2022

## 2023-06-18 NOTE — Assessment & Plan Note (Addendum)
 On paxil  Sx poorly controlled but he declines to see psychiatry

## 2023-06-18 NOTE — Assessment & Plan Note (Signed)
 Lab Results  Component Value Date   TSH 3.08 12/18/2022   On 100 mcg daily levothyroxine , doing well with current dose

## 2023-06-18 NOTE — Progress Notes (Signed)
 Name: Tyler MCNEE Sr.   MRN: [id number]    DOB: [birth year]   Date:06/18/2023       Progress Note  Chief Complaint  Patient presents with   Medical Management of Chronic Issues    HLD, HTN, Prediabetes, COPD, Mood  Please address cologuard screening, patient never got colonoscopy scheduled and showed interest in cologuard     Subjective:   Tyler PINSKY Sr. is a 63 y.o. male, presents to clinic for routine follow up on chronic conditions  Here for f/up on HTN, hypothyroid, anxiety  He is not out of any medications and overall doing well  He has no SE or concerns     Current Outpatient Medications:    albuterol  (VENTOLIN  HFA) 108 (90 Base) MCG/ACT inhaler, TAKE 2 PUFFS BY MOUTH EVERY 6 HOURS AS NEEDED FOR WHEEZE OR SHORTNESS OF BREATH (Patient not taking: Reported on 06/18/2023), Disp: 18 each, Rfl: 2   ipratropium-albuterol  (DUONEB) 0.5-2.5 (3) MG/3ML SOLN, Take 3 mLs by nebulization 3 (three) times daily as needed. (Patient not taking: Reported on 06/18/2023), Disp: 180 mL, Rfl: 1   levothyroxine  (SYNTHROID ) 100 MCG tablet, TAKE 1 TABLET BY MOUTH EVERY DAY BEFORE BREAKFAST, Disp: 90 tablet, Rfl: 1   lisinopril -hydrochlorothiazide  (ZESTORETIC ) 20-12.5 MG tablet, Take 1 tablet by mouth daily., Disp: 90 tablet, Rfl: 1   PARoxetine  (PAXIL ) 20 MG tablet, Take 1 tablet (20 mg total) by mouth daily., Disp: 90 tablet, Rfl: 1   simvastatin  (ZOCOR ) 40 MG tablet, TAKE 1 TABLET BY MOUTH EVERYDAY AT BEDTIME, Disp: 90 tablet, Rfl: 1   SYMBICORT  160-4.5 MCG/ACT inhaler, Inhale 2 puffs into the lungs 2 (two) times daily. (Patient not taking: Reported on 06/18/2023), Disp: 3 each, Rfl: 1   Vitamin D , Ergocalciferol , (DRISDOL ) 1.25 MG (50000 UNIT) CAPS capsule, Take 1 capsule (50,000 Units total) by mouth every 7 (seven) days. (Patient not taking: Reported on 06/18/2023), Disp: 12 capsule, Rfl: 0  Patient Active Problem List   Diagnosis Date Noted   Current mild episode of major depressive  disorder (HCC) 06/11/2022   Elevated hemoglobin (HCC) 06/10/2022   Prediabetes 06/10/2022   Chronic obstructive pulmonary disease (HCC) 08/05/2020   Ectatic abdominal aorta (HCC) 08/15/2018   B12 deficiency 09/12/2017   Overweight (BMI 25.0-29.9) 07/02/2017   Herpes 01/03/2016   Umbilical hernia without obstruction and without gangrene 04/22/2015   Umbilical hernia 04/09/2015   Scoliosis 01/08/2015   Spinal stenosis, lumbar 01/08/2015   DDD (degenerative disc disease), lumbar 01/08/2015   Agoraphobia with panic attacks 08/24/2014   AAT (alpha-1-antitrypsin) deficiency (HCC)    Tobacco use    Hyperlipidemia    Hypertension    Hypothyroidism    Thyroid  cyst    Anxiety    Bipolar disorder (HCC)    Vitamin D  deficiency disease    Insomnia    Secondary erythrocytosis 05/08/2014    Past Surgical History:  Procedure Laterality Date   COLONOSCOPY  2013   Dr. Viktoria MANIFOLD 2    Family History  Problem Relation Age of Onset   Heart disease Father    Heart failure Father    Heart attack Father        x 5   Alcohol abuse Father    COPD Father    Dementia Mother    Hypertension Mother    Heart attack Brother    CAD Brother    Hypertension Brother    Alcohol abuse Paternal Grandfather    COPD Maternal Grandfather  Cancer Neg Hx    Diabetes Neg Hx    Stroke Neg Hx     Social History   Tobacco Use   Smoking status: Every Day    Current packs/day: 1.50    Average packs/day: 1.5 packs/day for 40.0 years (60.0 ttl pk-yrs)    Types: Cigarettes   Smokeless tobacco: Never  Vaping Use   Vaping status: Never Used  Substance Use Topics   Alcohol use: No   Drug use: No     Allergies  Allergen Reactions   No Known Allergies     Health Maintenance  Topic Date Due   Medicare Annual Wellness (AWV)  Never done   Pneumococcal Vaccine: 50+ Years (1 of 2 - PCV) Never done   Colonoscopy  05/05/2021   Zoster Vaccines- Shingrix (1 of 2) 09/17/2023 (Originally 04/02/2008)   Lung  Cancer Screening  06/16/2024 (Originally 04/02/2008)   INFLUENZA VACCINE  08/06/2023   DTaP/Tdap/Td (2 - Td or Tdap) 01/02/2026   Hepatitis C Screening  Completed   HIV Screening  Completed   HPV VACCINES  Aged Out   Meningococcal B Vaccine  Aged Out   COVID-19 Vaccine  Discontinued    Chart Review Today: I personally reviewed active problem list, medication list, allergies, family history, social history, health maintenance, notes from last encounter, lab results, imaging with the patient/caregiver today.   Review of Systems  Constitutional: Negative.   HENT: Negative.    Eyes: Negative.   Respiratory: Negative.    Cardiovascular: Negative.   Gastrointestinal: Negative.   Endocrine: Negative.   Genitourinary: Negative.   Musculoskeletal: Negative.   Skin: Negative.   Allergic/Immunologic: Negative.   Neurological: Negative.   Hematological: Negative.   Psychiatric/Behavioral: Negative.    All other systems reviewed and are negative.    Objective:   Vitals:   06/18/23 0854 06/18/23 0920  BP: (!) 148/80 134/80  Pulse: 71   Temp: 98.2 F (36.8 C)   TempSrc: Oral   SpO2: 95%   Weight: 204 lb 14.4 oz (92.9 kg)   Height: 5' 10 (1.778 m)     Body mass index is 29.4 kg/m.  Physical Exam Vitals and nursing note reviewed.  Constitutional:      General: He is not in acute distress.    Appearance: Normal appearance. He is well-developed. He is obese. He is not ill-appearing, toxic-appearing or diaphoretic.  HENT:     Head: Normocephalic and atraumatic.     Right Ear: External ear normal.     Left Ear: External ear normal.     Nose: Nose normal.   Eyes:     General: No scleral icterus.       Right eye: No discharge.        Left eye: No discharge.     Conjunctiva/sclera: Conjunctivae normal.   Neck:     Trachea: No tracheal deviation.   Cardiovascular:     Rate and Rhythm: Normal rate and regular rhythm.     Pulses: Normal pulses.     Heart sounds: Normal  heart sounds.  Pulmonary:     Effort: Pulmonary effort is normal. No respiratory distress.     Breath sounds: No stridor. No wheezing, rhonchi or rales.   Skin:    General: Skin is warm and dry.     Findings: No rash.   Neurological:     Mental Status: He is alert.     Motor: No abnormal muscle tone.     Coordination: Coordination  normal.     Gait: Gait normal.   Psychiatric:        Mood and Affect: Mood normal.        Behavior: Behavior normal.        Results for orders placed or performed in visit on 12/18/22  Lipid Profile   Collection Time: 12/18/22  9:16 AM  Result Value Ref Range   Cholesterol 132 <200 mg/dL   HDL 34 (L) > OR = 40 mg/dL   Triglycerides 80 <849 mg/dL   LDL Cholesterol (Calc) 82 mg/dL (calc)   Total CHOL/HDL Ratio 3.9 <5.0 (calc)   Non-HDL Cholesterol (Calc) 98 <869 mg/dL (calc)  HgB J8r   Collection Time: 12/18/22  9:16 AM  Result Value Ref Range   Hgb A1c MFr Bld 6.1 (H) <5.7 % of total Hgb   Mean Plasma Glucose 128 mg/dL   eAG (mmol/L) 7.1 mmol/L  CBC with Differential/Platelet   Collection Time: 12/18/22  9:16 AM  Result Value Ref Range   WBC 10.7 3.8 - 10.8 Thousand/uL   RBC 6.29 (H) 4.20 - 5.80 Million/uL   Hemoglobin 17.7 (H) 13.2 - 17.1 g/dL   HCT 46.4 (H) 61.4 - 49.9 %   MCV 85.1 80.0 - 100.0 fL   MCH 28.1 27.0 - 33.0 pg   MCHC 33.1 32.0 - 36.0 g/dL   RDW 86.5 88.9 - 84.9 %   Platelets 414 (H) 140 - 400 Thousand/uL   MPV 10.5 7.5 - 12.5 fL   Neutro Abs 6,902 1,500 - 7,800 cells/uL   Absolute Lymphocytes 2,654 850 - 3,900 cells/uL   Absolute Monocytes 674 200 - 950 cells/uL   Eosinophils Absolute 364 15 - 500 cells/uL   Basophils Absolute 107 0 - 200 cells/uL   Neutrophils Relative % 64.5 %   Total Lymphocyte 24.8 %   Monocytes Relative 6.3 %   Eosinophils Relative 3.4 %   Basophils Relative 1.0 %  COMPLETE METABOLIC PANEL WITH GFR   Collection Time: 12/18/22  9:16 AM  Result Value Ref Range   Glucose, Bld 99 65 - 99 mg/dL    BUN 11 7 - 25 mg/dL   Creat 8.99 9.29 - 8.64 mg/dL   eGFR 84 > OR = 60 fO/fpw/8.26f7   BUN/Creatinine Ratio SEE NOTE: 6 - 22 (calc)   Sodium 137 135 - 146 mmol/L   Potassium 4.2 3.5 - 5.3 mmol/L   Chloride 100 98 - 110 mmol/L   CO2 25 20 - 32 mmol/L   Calcium 9.7 8.6 - 10.3 mg/dL   Total Protein 7.5 6.1 - 8.1 g/dL   Albumin 4.3 3.6 - 5.1 g/dL   Globulin 3.2 1.9 - 3.7 g/dL (calc)   AG Ratio 1.3 1.0 - 2.5 (calc)   Total Bilirubin 0.4 0.2 - 1.2 mg/dL   Alkaline phosphatase (APISO) 117 35 - 144 U/L   AST 22 10 - 35 U/L   ALT 19 9 - 46 U/L  Vitamin D  (25 hydroxy)   Collection Time: 12/18/22  9:16 AM  Result Value Ref Range   Vit D, 25-Hydroxy 28 (L) 30 - 100 ng/mL  TSH   Collection Time: 12/18/22  9:16 AM  Result Value Ref Range   TSH 3.08 0.40 - 4.50 mIU/L      Assessment & Plan:   Primary hypertension Assessment & Plan: Slightly elevated today but near goal when rechecked Pt reports good med compliance and no SE or concerns BP Readings from Last 3 Encounters:  06/18/23 134/80  12/18/22 130/74  06/11/22 126/70   Recheck renal function and electrolytes Pt encouraged to continue to work on healthy diet (low salt) and lifestyle for improving HTN management - DASH diet handout offered today   Orders: -     Lisinopril -hydroCHLOROthiazide ; Take 1 tablet by mouth daily.  Dispense: 90 tablet; Refill: 1  Bipolar affective disorder, currently depressed, mild (HCC) Assessment & Plan: Chronic, historic condition Overall pt continues to report poorly controlled sx in general On paxil  40 mg daily    06/18/2023    9:13 AM 12/18/2022    8:44 AM 06/11/2022    8:53 AM  Depression screen PHQ 2/9  Decreased Interest 2 0 0  Down, Depressed, Hopeless 0 0 0  PHQ - 2 Score 2 0 0  Altered sleeping 1 0 0  Tired, decreased energy 1 0 0  Change in appetite 0 0 0  Feeling bad or failure about yourself  0 0 0  Trouble concentrating 1 0 0  Moving slowly or fidgety/restless 0 0 0   Suicidal thoughts 0 0 0  PHQ-9 Score 5 0 0  Difficult doing work/chores Somewhat difficult  Not difficult at all      Orders: -     PARoxetine  HCl; Take 1 tablet (20 mg total) by mouth daily.  Dispense: 90 tablet; Refill: 1 -     CBC with Differential/Platelet  Agoraphobia with panic attacks Assessment & Plan: On paxil  Sx poorly controlled but he declines to see psychiatry  Orders: -     PARoxetine  HCl; Take 1 tablet (20 mg total) by mouth daily.  Dispense: 90 tablet; Refill: 1  Mixed hyperlipidemia Assessment & Plan: Lab Results  Component Value Date   CHOL 132 12/18/2022   HDL 34 (L) 12/18/2022   LDLCALC 82 12/18/2022   TRIG 80 12/18/2022   CHOLHDL 3.9 12/18/2022  Labs checked with last OV, on statin and tolerating, continue statin and diet lifestyle efforts   Orders: -     Simvastatin ; TAKE 1 TABLET BY MOUTH EVERYDAY AT BEDTIME  Dispense: 90 tablet; Refill: 1  Hypothyroidism, unspecified type Assessment & Plan: Lab Results  Component Value Date   TSH 3.08 12/18/2022   On 100 mcg daily levothyroxine , doing well with current dose  Orders: -     Levothyroxine  Sodium; TAKE 1 TABLET BY MOUTH EVERY DAY BEFORE BREAKFAST  Dispense: 90 tablet; Refill: 1 -     TSH  Prediabetes Assessment & Plan: Recheck labs  Orders: -     Hemoglobin A1c -     Comprehensive metabolic panel with GFR  Vitamin D  deficiency disease Assessment & Plan: Last vitamin D  Lab Results  Component Value Date   VD25OH 28 (L) 12/18/2022      Elevated hemoglobin (HCC) Assessment & Plan: Monitoring Likely from smoking or obstructive sleep apnea Discussed labs and reviewed   Screening for malignant neoplasm of colon -     Ambulatory referral to Gastroenterology  AAT (alpha-1-antitrypsin) deficiency (HCC)  Chronic obstructive pulmonary disease, unspecified COPD type (HCC) Assessment & Plan: Not using symbicort  regularly SOB rarely limiting activity (pt not very active) Would  refer to pulm for further assessment and management however pt not currently interested      Return for needs acute OV for joint pain/arthritis, and needs MWV, then 6 month routine .   Michelene Cower, PA-C 06/18/23 9:31 AM

## 2023-06-19 LAB — COMPREHENSIVE METABOLIC PANEL WITH GFR
AG Ratio: 1.2 (calc) (ref 1.0–2.5)
ALT: 17 U/L (ref 9–46)
AST: 22 U/L (ref 10–35)
Albumin: 4.3 g/dL (ref 3.6–5.1)
Alkaline phosphatase (APISO): 112 U/L (ref 35–144)
BUN: 15 mg/dL (ref 7–25)
CO2: 27 mmol/L (ref 20–32)
Calcium: 9.8 mg/dL (ref 8.6–10.3)
Chloride: 99 mmol/L (ref 98–110)
Creat: 1.05 mg/dL (ref 0.70–1.35)
Globulin: 3.6 g/dL (ref 1.9–3.7)
Glucose, Bld: 96 mg/dL (ref 65–99)
Potassium: 4.6 mmol/L (ref 3.5–5.3)
Sodium: 136 mmol/L (ref 135–146)
Total Bilirubin: 0.5 mg/dL (ref 0.2–1.2)
Total Protein: 7.9 g/dL (ref 6.1–8.1)
eGFR: 79 mL/min/{1.73_m2} (ref >=60)

## 2023-06-19 LAB — CBC WITH DIFFERENTIAL/PLATELET
Absolute Lymphocytes: 2237 {cells}/uL (ref 850–3900)
Absolute Monocytes: 774 {cells}/uL (ref 200–950)
Basophils Absolute: 117 {cells}/uL (ref 0–200)
Basophils Relative: 1.1 %
Eosinophils Absolute: 297 {cells}/uL (ref 15–500)
Eosinophils Relative: 2.8 %
HCT: 54.7 % — ABNORMAL HIGH (ref 38.5–50.0)
Hemoglobin: 17.7 g/dL — ABNORMAL HIGH (ref 13.2–17.1)
MCH: 28.4 pg (ref 27.0–33.0)
MCHC: 32.4 g/dL (ref 32.0–36.0)
MCV: 87.8 fL (ref 80.0–100.0)
MPV: 9.9 fL (ref 7.5–12.5)
Monocytes Relative: 7.3 %
Neutro Abs: 7176 {cells}/uL (ref 1500–7800)
Neutrophils Relative %: 67.7 %
Platelets: 395 10*3/uL (ref 140–400)
RBC: 6.23 10*6/uL — ABNORMAL HIGH (ref 4.20–5.80)
RDW: 14.4 % (ref 11.0–15.0)
Total Lymphocyte: 21.1 %
WBC: 10.6 10*3/uL (ref 3.8–10.8)

## 2023-06-19 LAB — HEMOGLOBIN A1C
Hgb A1c MFr Bld: 6.2 % — ABNORMAL HIGH (ref <5.7)
Mean Plasma Glucose: 131 mg/dL
eAG (mmol/L): 7.3 mmol/L

## 2023-06-19 LAB — TSH: TSH: 2.64 m[IU]/L (ref 0.40–4.50)

## 2023-06-21 ENCOUNTER — Ambulatory Visit: Payer: Self-pay | Admitting: Family Medicine

## 2023-06-29 ENCOUNTER — Encounter: Payer: Self-pay | Admitting: Family Medicine

## 2023-06-29 NOTE — Assessment & Plan Note (Signed)
 Lab Results  Component Value Date   CHOL 132 12/18/2022   HDL 34 (L) 12/18/2022   LDLCALC 82 12/18/2022   TRIG 80 12/18/2022   CHOLHDL 3.9 12/18/2022  Labs checked with last OV, on statin and tolerating, continue statin and diet lifestyle efforts

## 2023-06-29 NOTE — Assessment & Plan Note (Signed)
 Recheck labs

## 2023-06-29 NOTE — Assessment & Plan Note (Signed)
 Not using symbicort  regularly SOB rarely limiting activity (pt not very active) Would refer to pulm for further assessment and management however pt not currently interested

## 2023-06-29 NOTE — Assessment & Plan Note (Signed)
 Chronic, historic condition Overall pt continues to report poorly controlled sx in general On paxil  40 mg daily    06/18/2023    9:13 AM 12/18/2022    8:44 AM 06/11/2022    8:53 AM  Depression screen PHQ 2/9  Decreased Interest 2 0 0  Down, Depressed, Hopeless 0 0 0  PHQ - 2 Score 2 0 0  Altered sleeping 1 0 0  Tired, decreased energy 1 0 0  Change in appetite 0 0 0  Feeling bad or failure about yourself  0 0 0  Trouble concentrating 1 0 0  Moving slowly or fidgety/restless 0 0 0  Suicidal thoughts 0 0 0  PHQ-9 Score 5 0 0  Difficult doing work/chores Somewhat difficult  Not difficult at all

## 2023-06-29 NOTE — Assessment & Plan Note (Signed)
 Slightly elevated today but near goal when rechecked Pt reports good med compliance and no SE or concerns BP Readings from Last 3 Encounters:  06/18/23 134/80  12/18/22 130/74  06/11/22 126/70   Recheck renal function and electrolytes Pt encouraged to continue to work on healthy diet (low salt) and lifestyle for improving HTN management - DASH diet handout offered today

## 2023-06-29 NOTE — Assessment & Plan Note (Signed)
 Monitoring Likely from smoking or obstructive sleep apnea Discussed labs and reviewed

## 2023-10-19 ENCOUNTER — Encounter: Payer: Self-pay | Admitting: Internal Medicine

## 2023-10-21 ENCOUNTER — Ambulatory Visit: Admitting: Family Medicine

## 2023-10-21 ENCOUNTER — Encounter: Payer: Self-pay | Admitting: Family Medicine

## 2023-10-21 VITALS — BP 122/74 | HR 87 | Resp 16 | Ht 70.0 in | Wt 204.7 lb

## 2023-10-21 DIAGNOSIS — R351 Nocturia: Secondary | ICD-10-CM

## 2023-10-21 DIAGNOSIS — N401 Enlarged prostate with lower urinary tract symptoms: Secondary | ICD-10-CM

## 2023-10-21 DIAGNOSIS — R350 Frequency of micturition: Secondary | ICD-10-CM | POA: Diagnosis not present

## 2023-10-21 DIAGNOSIS — D582 Other hemoglobinopathies: Secondary | ICD-10-CM

## 2023-10-21 DIAGNOSIS — I10 Essential (primary) hypertension: Secondary | ICD-10-CM

## 2023-10-21 DIAGNOSIS — Z23 Encounter for immunization: Secondary | ICD-10-CM | POA: Diagnosis not present

## 2023-10-21 DIAGNOSIS — R7303 Prediabetes: Secondary | ICD-10-CM | POA: Diagnosis not present

## 2023-10-21 LAB — POCT URINALYSIS DIPSTICK
Bilirubin, UA: NEGATIVE
Glucose, UA: NEGATIVE
Ketones, UA: NEGATIVE
Nitrite, UA: NEGATIVE
Odor: NORMAL
Protein, UA: NEGATIVE
Spec Grav, UA: 1.01 (ref 1.010–1.025)
Urobilinogen, UA: 0.2 U/dL
pH, UA: 5 (ref 5.0–8.0)

## 2023-10-21 MED ORDER — AMLODIPINE BESY-BENAZEPRIL HCL 5-20 MG PO CAPS: 1.0000 | ORAL_CAPSULE | Freq: Every day | ORAL | 0 refills | Status: DC

## 2023-10-21 MED ORDER — TAMSULOSIN HCL 0.4 MG PO CAPS: 0.4000 mg | ORAL_CAPSULE | Freq: Every day | ORAL | 0 refills | Status: DC

## 2023-10-21 NOTE — Progress Notes (Signed)
 Name: Tyler BETZOLD Sr.   MRN: [id number]    DOB: [birth year]   Date:10/21/2023       Progress Note  Subjective  Chief Complaint  Chief Complaint  Patient presents with   Urinary Frequency    On going for 2 years    HPI   He came today due to urinary frequency going on for the past couple of years, nocturia 1-3 times per night. Symptoms are stable but he states feeling tired and wants to know what is the problem. No family history of prostate cancer. He takes hydrochlorothiazide  . He denies hematuria or dysuria.  HTN is controlled with lisinopril  hydrochlorothiazide  20/12.5 mg, denies chest pain  Other fatigue reviewed recent labs, ESS was 4, but other risk factors for Sleep apnea, including increase in abdominal girth, HTN, nocturia   IPSS Questionnaire (AUA-7): Over the past month.   1)  How often have you had a sensation of not emptying your bladder completely after you finish urinating?  0 - Not at all  2)  How often have you had to urinate again less than two hours after you finished urinating? 5 - Almost always  3)  How often have you found you stopped and started again several times when you urinated?  5 - Almost always  4) How difficult have you found it to postpone urination?  0 - Not at all  5) How often have you had a weak urinary stream?  3 - About half the time  6) How often have you had to push or strain to begin urination?  3 - About half the time  7) How many times did you most typically get up to urinate from the time you went to bed until the time you got up in the morning?  2 - 2 times  Total score:  0-7 mildly symptomatic   8-19 moderately symptomatic   20-35 severely symptomatic     Patient Active Problem List   Diagnosis Date Noted   Current mild episode of major depressive disorder 06/11/2022   Elevated hemoglobin 06/10/2022   Prediabetes 06/10/2022   Chronic obstructive pulmonary disease (HCC) 08/05/2020   Ectatic abdominal aorta 08/15/2018   B12  deficiency 09/12/2017   Overweight (BMI 25.0-29.9) 07/02/2017   Herpes 01/03/2016   Umbilical hernia without obstruction and without gangrene 04/22/2015   Umbilical hernia 04/09/2015   Scoliosis 01/08/2015   Spinal stenosis, lumbar 01/08/2015   DDD (degenerative disc disease), lumbar 01/08/2015   Agoraphobia with panic attacks 08/24/2014   AAT (alpha-1-antitrypsin) deficiency (HCC)    Tobacco use    Hyperlipidemia    Hypertension    Hypothyroidism    Thyroid  cyst    Anxiety    Bipolar disorder (HCC)    Vitamin D  deficiency disease    Insomnia    Secondary erythrocytosis 05/08/2014    Social History   Tobacco Use   Smoking status: Every Day    Current packs/day: 1.50    Average packs/day: 1.5 packs/day for 40.0 years (60.0 ttl pk-yrs)    Types: Cigarettes   Smokeless tobacco: Never  Substance Use Topics   Alcohol use: No     Current Outpatient Medications:    levothyroxine  (SYNTHROID ) 100 MCG tablet, TAKE 1 TABLET BY MOUTH EVERY DAY BEFORE BREAKFAST, Disp: 90 tablet, Rfl: 1   lisinopril -hydrochlorothiazide  (ZESTORETIC ) 20-12.5 MG tablet, Take 1 tablet by mouth daily., Disp: 90 tablet, Rfl: 1   PARoxetine  (PAXIL ) 20 MG tablet, Take 1 tablet (20 mg total)  by mouth daily., Disp: 90 tablet, Rfl: 1   simvastatin  (ZOCOR ) 40 MG tablet, TAKE 1 TABLET BY MOUTH EVERYDAY AT BEDTIME, Disp: 90 tablet, Rfl: 1   albuterol  (VENTOLIN  HFA) 108 (90 Base) MCG/ACT inhaler, TAKE 2 PUFFS BY MOUTH EVERY 6 HOURS AS NEEDED FOR WHEEZE OR SHORTNESS OF BREATH (Patient not taking: Reported on 10/21/2023), Disp: 18 each, Rfl: 2   ipratropium-albuterol  (DUONEB) 0.5-2.5 (3) MG/3ML SOLN, Take 3 mLs by nebulization 3 (three) times daily as needed. (Patient not taking: Reported on 10/21/2023), Disp: 180 mL, Rfl: 1   SYMBICORT  160-4.5 MCG/ACT inhaler, Inhale 2 puffs into the lungs 2 (two) times daily. (Patient not taking: Reported on 10/21/2023), Disp: 3 each, Rfl: 1  Allergies  Allergen Reactions   No Known  Allergies     ROS  Ten systems reviewed and is negative except as mentioned in HPI    Objective  Vitals:   10/21/23 0950  BP: 122/74  Pulse: 87  Resp: 16  SpO2: 99%  Weight: 204 lb 11.2 oz (92.9 kg)  Height: 5' 10 (1.778 m)    Body mass index is 29.37 kg/m.   Physical Exam   Constitutional: Patient appears well-developed and well-nourished. No distress.  HEENT: head atraumatic, normocephalic, pupils equal and reactive to light, , neck supple, throat within normal limits Cardiovascular: Normal rate, regular rhythm and normal heart sounds.  No murmur heard. No BLE edema. Pulmonary/Chest: Effort normal and breath sounds normal. No respiratory distress. Abdominal: Soft.  There is no tenderness. Rectal : soft prostate , enlarged , normal external exam  Psychiatric: Patient has a normal mood and affect. behavior is normal. Judgment and thought content normal.   Recent Results (from the past 2160 hours)  POCT urinalysis dipstick     Status: Abnormal   Collection Time: 10/21/23 10:13 AM  Result Value Ref Range   Color, UA Straw    Clarity, UA Clear    Glucose, UA Negative Negative   Bilirubin, UA Negative    Ketones, UA Negative    Spec Grav, UA 1.010 1.010 - 1.025   Blood, UA Small    pH, UA 5.0 5.0 - 8.0   Protein, UA Negative Negative   Urobilinogen, UA 0.2 0.2 or 1.0 E.U./dL   Nitrite, UA Negative    Leukocytes, UA Trace (A) Negative   Appearance yellow    Odor Normal      Assessment & Plan   1. Urinary frequency (Primary)  Discussed it may be prostate enlargement, we will stop diuretic and start lotrel instead, keep glucose under control and if enlarged prostate start tamsulosin  - POCT urinalysis dipstick - Urine Culture - PSA   2. Need for influenza vaccination  - Flu vaccine HIGH DOSE PF(Fluzone Trivalent)  3. Pre-diabetes  Discussed low carb diet  4. Elevated hemoglobin  - Ambulatory referral to Sleep Studies  5. Primary  hypertension  Stop hydrochlorothiazide  due to urinary frequency  - Ambulatory referral to Sleep Studies - amLODipine-benazepril (LOTREL) 5-20 MG capsule; Take 1 capsule by mouth daily. In place of lisinopril  hydrochlorothiazide   Dispense: 90 capsule; Refill: 0  6. BPH associated with nocturia  - tamsulosin (FLOMAX) 0.4 MG CAPS capsule; Take 1 capsule (0.4 mg total) by mouth daily.  Dispense: 90 capsule; Refill: 0

## 2023-10-22 LAB — URINE CULTURE
MICRO NUMBER:: 17109707
Result:: NO GROWTH
SPECIMEN QUALITY:: ADEQUATE

## 2023-10-22 LAB — PSA: PSA: 1.2 ng/mL (ref <=4.00)

## 2023-10-25 ENCOUNTER — Ambulatory Visit: Payer: Self-pay | Admitting: Family Medicine

## 2023-11-01 ENCOUNTER — Other Ambulatory Visit: Payer: Self-pay

## 2023-11-01 DIAGNOSIS — I10 Essential (primary) hypertension: Secondary | ICD-10-CM

## 2023-11-01 DIAGNOSIS — R351 Nocturia: Secondary | ICD-10-CM

## 2023-11-01 DIAGNOSIS — D582 Other hemoglobinopathies: Secondary | ICD-10-CM

## 2023-12-06 ENCOUNTER — Ambulatory Visit: Admitting: Family Medicine

## 2023-12-14 ENCOUNTER — Ambulatory Visit: Payer: Self-pay

## 2023-12-14 NOTE — Telephone Encounter (Signed)
 FYI Only or Action Required?: FYI only for provider: appointment scheduled on 12/10.  Patient was last seen in primary care on 10/21/2023 by Glenard Mire, MD.  Called Nurse Triage reporting Hypertension.  Symptoms began several weeks ago.  Interventions attempted: Prescription medications: Lisinopril .  Symptoms are: unchanged.  Triage Disposition: See Physician Within 24 Hours  Patient/caregiver understands and will follow disposition?: Yes                          Copied from CRM #[id number]. Topic: Clinical - Red Word Triage >> Dec 14, 2023  4:02 PM Rachelle R wrote: [facility] that prompted transfer to Nurse Triage: Patient states that his BP has been running high. 183/83 the last time he took it. Has had a head heart and increased heart rate. Rushing in his ears. Reason for Disposition  Systolic BP >= 180 OR Diastolic >= 110  Answer Assessment - Initial Assessment Questions 1. BLOOD PRESSURE: What is your blood pressure? Did you take at least two measurements 5 minutes apart?     183/83 was obtained x 2 days ago, this AM 156/85   2. ONSET: When did you take your blood pressure?     This morning   3. HOW: How did you take your blood pressure? (e.g., automatic home BP monitor, visiting nurse)     automatic home BP monitor  4. HISTORY: Do you have a history of high blood pressure?     HTN  5. MEDICINES: Are you taking any medicines for blood pressure? Have you missed any doses recently?     Lisinopril  (old prescription)  6. OTHER SYMPTOMS: Do you have any symptoms? (e.g., blurred vision, chest pain, difficulty breathing, headache, weakness)  Rushing in his ears noted, headache was noted to be happening more frequently. He is taking Advil for headache.   Patient called into triage for HTN concerns. He stated this has been ongoing  x 5 weeks. Stated his b/p medications were changed during last office visit but those medication was not  effective so he started back taking the D/C'd Lisinopril  with out the provider being aware.  Appointment scheduled for evaluation. Patient agrees with plan of care, and will call back if anything changes, or if symptoms worsen.  Protocols used: Blood Pressure - High-A-AH

## 2023-12-15 ENCOUNTER — Ambulatory Visit: Admitting: Family Medicine

## 2023-12-15 ENCOUNTER — Encounter: Payer: Self-pay | Admitting: Family Medicine

## 2023-12-15 VITALS — BP 156/86 | HR 85 | Resp 16 | Ht 70.0 in | Wt 202.8 lb

## 2023-12-15 DIAGNOSIS — I10 Essential (primary) hypertension: Secondary | ICD-10-CM | POA: Diagnosis not present

## 2023-12-15 DIAGNOSIS — F3131 Bipolar disorder, current episode depressed, mild: Secondary | ICD-10-CM

## 2023-12-15 MED ORDER — LISINOPRIL-HYDROCHLOROTHIAZIDE 20-12.5 MG PO TABS: 2.0000 | ORAL_TABLET | Freq: Every day | ORAL | 0 refills | Status: DC

## 2023-12-15 NOTE — Progress Notes (Signed)
 Name: Tyler CUETO Sr.   MRN: [id number]    DOB: 29-Dec-1960   Date:12/15/2023       Progress Note  Subjective  Chief Complaint  Chief Complaint  Patient presents with   Hypertension    Elevated readings at home   Discussed the use of AI scribe software for clinical note transcription with the patient, who gave verbal consent to proceed.  History of Present Illness Tyler Cobb. is a 63 year old male with hypertension who presents with elevated blood pressure and concerns about medication changes.  His blood pressure has been elevated since his medication was changed from lisinopril  HCTZ to Lotrel 5/20 due to a visit in October that he complained of LUTS and diuretic was stopped. He states BP at home has been going up and reached  a reading of 185/85 mmHg at one point. He has resumed taking lisinopril  HCTZ, which he had been on for 15 years with good control, but his blood pressure remains high. He has been taking 20 mg of lisinopril  hydrochlorothiazide  12.5 mg  daily for the past two to three days.  No chest pain or palpitations currently, although he previously experienced palpitations which he attributed to Flomax , leading him to discontinue it. He feels 'swishes in my head,' lacks energy, and desires to sleep all the time, which he associates with his elevated blood pressure. These symptoms have been present for several weeks.  Regarding urinary symptoms, there is no significant change in urinary frequency after stopping the fluid pill, still needing to get up twice at night. He has not been taking Flomax  due to concerns about palpitations.  His sister moved in with him about six weeks ago, which has been a source of stress, potentially contributing to his elevated blood pressure. He describes his relationship as tense, with frequent arguments, and notes that he prefers living alone. He also reports a history of depression and bipolar disorder, which may be exacerbated by the  current living situation.    Patient Active Problem List   Diagnosis Date Noted   Current mild episode of major depressive disorder 06/11/2022   Prediabetes 06/10/2022   Chronic obstructive pulmonary disease (HCC) 08/05/2020   Ectatic abdominal aorta 08/15/2018   B12 deficiency 09/12/2017   Overweight (BMI 25.0-29.9) 07/02/2017   Herpes 01/03/2016   Umbilical hernia without obstruction and without gangrene 04/22/2015   Scoliosis 01/08/2015   Spinal stenosis, lumbar 01/08/2015   DDD (degenerative disc disease), lumbar 01/08/2015   Agoraphobia with panic attacks 08/24/2014   AAT (alpha-1-antitrypsin) deficiency (HCC)    Tobacco use    Hyperlipidemia    Hypertension    Hypothyroidism    Thyroid  cyst    Anxiety    Bipolar disorder (HCC)    Vitamin D  deficiency disease    Insomnia    Secondary erythrocytosis 05/08/2014    Past Surgical History:  Procedure Laterality Date   COLONOSCOPY  2013   Dr. Viktoria MANIFOLD 2    Family History  Problem Relation Age of Onset   Heart disease Father    Heart failure Father    Heart attack Father        x 5   Alcohol abuse Father    COPD Father    Dementia Mother    Hypertension Mother    Heart attack Brother    CAD Brother    Hypertension Brother    Alcohol abuse Paternal Grandfather    COPD Maternal Grandfather    Cancer Neg  Hx    Diabetes Neg Hx    Stroke Neg Hx     Social History   Tobacco Use   Smoking status: Every Day    Current packs/day: 1.50    Average packs/day: 1.5 packs/day for 40.0 years (60.0 ttl pk-yrs)    Types: Cigarettes   Smokeless tobacco: Never  Substance Use Topics   Alcohol use: No     Current Outpatient Medications:    levothyroxine  (SYNTHROID ) 100 MCG tablet, TAKE 1 TABLET BY MOUTH EVERY DAY BEFORE BREAKFAST, Disp: 90 tablet, Rfl: 1   lisinopril -hydrochlorothiazide  (ZESTORETIC ) 20-12.5 MG tablet, Take 2 tablets by mouth daily., Disp: 180 tablet, Rfl: 0   PARoxetine  (PAXIL ) 20 MG tablet, Take 1  tablet (20 mg total) by mouth daily., Disp: 90 tablet, Rfl: 1   simvastatin  (ZOCOR ) 40 MG tablet, TAKE 1 TABLET BY MOUTH EVERYDAY AT BEDTIME, Disp: 90 tablet, Rfl: 1   tamsulosin  (FLOMAX ) 0.4 MG CAPS capsule, Take 1 capsule (0.4 mg total) by mouth daily., Disp: 90 capsule, Rfl: 0   albuterol  (VENTOLIN  HFA) 108 (90 Base) MCG/ACT inhaler, TAKE 2 PUFFS BY MOUTH EVERY 6 HOURS AS NEEDED FOR WHEEZE OR SHORTNESS OF BREATH (Patient not taking: Reported on 12/15/2023), Disp: 18 each, Rfl: 2   ipratropium-albuterol  (DUONEB) 0.5-2.5 (3) MG/3ML SOLN, Take 3 mLs by nebulization 3 (three) times daily as needed. (Patient not taking: Reported on 12/15/2023), Disp: 180 mL, Rfl: 1   SYMBICORT  160-4.5 MCG/ACT inhaler, Inhale 2 puffs into the lungs 2 (two) times daily. (Patient not taking: Reported on 12/15/2023), Disp: 3 each, Rfl: 1  Allergies  Allergen Reactions   No Known Allergies     I personally reviewed active problem list, medication list, allergies, family history with the patient/caregiver today.   ROS  Ten systems reviewed and is negative except as mentioned in HPI    Objective Physical Exam CONSTITUTIONAL: Patient appears well-developed and well-nourished.  No distress. HEENT: Head atraumatic, normocephalic, neck supple. CARDIOVASCULAR: Normal rate, regular rhythm and normal heart sounds.  No murmur heard. No BLE edema. PULMONARY: Effort normal and breath sounds normal. No respiratory distress. ABDOMINAL: There is no tenderness or distention. MUSCULOSKELETAL: Normal gait. Without gross motor or sensory deficit. PSYCHIATRIC: Patient has a normal mood and affect. behavior is normal. Judgment and thought content normal.  Vitals:   12/15/23 1115  BP: (!) 156/86  Pulse: 85  Resp: 16  SpO2: 97%  Weight: 202 lb 12.8 oz (92 kg)  Height: 5' 10 (1.778 m)    Body mass index is 29.1 kg/m.  Recent Results (from the past 2160 hours)  POCT urinalysis dipstick     Status: Abnormal   Collection  Time: 10/21/23 10:13 AM  Result Value Ref Range   Color, UA Straw    Clarity, UA Clear    Glucose, UA Negative Negative   Bilirubin, UA Negative    Ketones, UA Negative    Spec Grav, UA 1.010 1.010 - 1.025   Blood, UA Small    pH, UA 5.0 5.0 - 8.0   Protein, UA Negative Negative   Urobilinogen, UA 0.2 0.2 or 1.0 E.U./dL   Nitrite, UA Negative    Leukocytes, UA Trace (A) Negative   Appearance yellow    Odor Normal   Urine Culture     Status: None   Collection Time: 10/21/23 10:38 AM   Specimen: Urine  Result Value Ref Range   MICRO NUMBER: [id number]    SPECIMEN QUALITY: Adequate    Sample Source  URINE    STATUS: FINAL    Result: No Growth   PSA     Status: None   Collection Time: 10/21/23 10:38 AM  Result Value Ref Range   PSA 1.20 < OR = 4.00 ng/mL    Comment: The total PSA value from this assay system is  standardized against the WHO standard. The test  result will be approximately 20% lower when compared  to the equimolar-standardized total PSA (Beckman  Coulter). Comparison of serial PSA results should be  interpreted with this fact in mind. . This test was performed using the Siemens  chemiluminescent method. Values obtained from  different assay methods cannot be used interchangeably. PSA levels, regardless of value, should not be interpreted as absolute evidence of the presence or absence of disease.      PHQ2/9:    12/15/2023   11:13 AM 10/21/2023    9:44 AM 06/18/2023    9:13 AM 12/18/2022    8:44 AM 06/11/2022    8:53 AM  Depression screen PHQ 2/9  Decreased Interest 0 0 2 0 0  Down, Depressed, Hopeless 0 0 0 0 0  PHQ - 2 Score 0 0 2 0 0  Altered sleeping 0 0 1 0 0  Tired, decreased energy 0 0 1 0 0  Change in appetite 0 0 0 0 0  Feeling bad or failure about yourself  0 0 0 0 0  Trouble concentrating 0 0 1 0 0  Moving slowly or fidgety/restless 0 0 0 0 0  Suicidal thoughts 0 0 0 0 0  PHQ-9 Score 0 0  5  0  0   Difficult doing work/chores Not  difficult at all Not difficult at all Somewhat difficult  Not difficult at all     Data saved with a previous flowsheet row definition    phq 9 is negative  Fall Risk:    12/15/2023   11:13 AM 10/21/2023    9:44 AM 06/18/2023    9:03 AM 12/18/2022    8:44 AM 06/11/2022    8:52 AM  Fall Risk   Falls in the past year? 0 0 0 0 0  Number falls in past yr: 0 0 0 0 0  Injury with Fall? 0 0  0  0  0   Risk for fall due to : No Fall Risks No Fall Risks No Fall Risks No Fall Risks No Fall Risks  Follow up Falls evaluation completed Falls evaluation completed Falls evaluation completed Falls prevention discussed Falls prevention discussed;Education provided;Falls evaluation completed     Data saved with a previous flowsheet row definition     Assessment & Plan Essential hypertension Hypertension poorly controlled with readings up to 185/85 mmHg. Symptoms improved with lisinopril  hydrochlorothiazide  . Stress may contribute to elevated blood pressure. - Increased lisinopril  HCTZ to 20/12.5 mg, two tablets daily. - Stay off Lotrel  - Monitor blood pressure and symptoms. - Consider cardiology referral if symptoms persist or worsen. - Reassess blood pressure control in six weeks.  Bipolar disorder, current episode depressed Depression worsened by stressors, including living situation changes. No current psychiatric care. Stress may affect blood pressure and depressive symptoms.  Benign prostatic hyperplasia with lower urinary tract symptoms Urinary frequency persists. Flomax  not tolerated due to palpitations. Symptoms manageable but bothersome. - Consider reintroducing Flomax  if symptoms worsen.

## 2024-01-12 ENCOUNTER — Other Ambulatory Visit: Payer: Self-pay | Admitting: Family Medicine

## 2024-01-12 DIAGNOSIS — I10 Essential (primary) hypertension: Secondary | ICD-10-CM

## 2024-01-13 NOTE — Telephone Encounter (Signed)
 Requested by interface surescripts. Medication discontinued.  Requested Prescriptions  Refused Prescriptions Disp Refills   amLODipine -benazepril  (LOTREL) 5-20 MG capsule [Pharmacy Med Name: AMLODIPINE -BENAZEPRIL  5-20 MG] 90 capsule 0    Sig: TAKE 1 CAPSULE BY MOUTH DAILY. IN PLACE OF LISINOPRIL  HYDROCHLOROTHIAZIDE      Cardiovascular: CCB + ACEI Combos Failed - 01/13/2024  2:46 PM      Failed - Cr in normal range and within 180 days    Creat  Date Value Ref Range Status  06/18/2023 1.05 0.70 - 1.35 mg/dL Final         Failed - K in normal range and within 180 days    Potassium  Date Value Ref Range Status  06/18/2023 4.6 3.5 - 5.3 mmol/L Final         Failed - Na in normal range and within 180 days    Sodium  Date Value Ref Range Status  06/18/2023 136 135 - 146 mmol/L Final  01/08/2015 141 134 - 144 mmol/L Final         Failed - eGFR is 30 or above and within 180 days    GFR, Est African American  Date Value Ref Range Status  03/26/2020 98 > OR = 60 mL/min/1.72m2 Final   GFR, Est Non African American  Date Value Ref Range Status  03/26/2020 85 > OR = 60 mL/min/1.34m2 Final   GFR, Estimated  Date Value Ref Range Status  05/15/2021 >60 >60 mL/min Final    Comment:    (NOTE) Calculated using the CKD-EPI Creatinine Equation (2021)    eGFR  Date Value Ref Range Status  06/18/2023 79 > OR = 60 mL/min/1.57m2 Final         Failed - Last BP in normal range    BP Readings from Last 1 Encounters:  12/15/23 (!) 156/86         Failed - Valid encounter within last 6 months    Recent Outpatient Visits           4 weeks ago Essential hypertension   [facility] Glenard Mire, MD   2 months ago Urinary frequency   [facility] Glenard Mire, MD   6 months ago Primary hypertension   [facility] Leavy Mole, PA-C              Passed - Patient is not pregnant

## 2024-01-26 ENCOUNTER — Encounter: Payer: Self-pay | Admitting: Nurse Practitioner

## 2024-01-26 ENCOUNTER — Ambulatory Visit: Admitting: Nurse Practitioner

## 2024-01-26 VITALS — BP 138/80 | HR 67 | Temp 97.0°F | Ht 70.0 in | Wt 206.0 lb

## 2024-01-26 DIAGNOSIS — E039 Hypothyroidism, unspecified: Secondary | ICD-10-CM | POA: Diagnosis not present

## 2024-01-26 DIAGNOSIS — R351 Nocturia: Secondary | ICD-10-CM | POA: Diagnosis not present

## 2024-01-26 DIAGNOSIS — N401 Enlarged prostate with lower urinary tract symptoms: Secondary | ICD-10-CM | POA: Insufficient documentation

## 2024-01-26 DIAGNOSIS — F4001 Agoraphobia with panic disorder: Secondary | ICD-10-CM

## 2024-01-26 DIAGNOSIS — I10 Essential (primary) hypertension: Secondary | ICD-10-CM | POA: Diagnosis not present

## 2024-01-26 DIAGNOSIS — J449 Chronic obstructive pulmonary disease, unspecified: Secondary | ICD-10-CM

## 2024-01-26 DIAGNOSIS — E8801 Alpha-1-antitrypsin deficiency: Secondary | ICD-10-CM | POA: Diagnosis not present

## 2024-01-26 DIAGNOSIS — Z131 Encounter for screening for diabetes mellitus: Secondary | ICD-10-CM | POA: Diagnosis not present

## 2024-01-26 DIAGNOSIS — E782 Mixed hyperlipidemia: Secondary | ICD-10-CM | POA: Diagnosis not present

## 2024-01-26 DIAGNOSIS — F3131 Bipolar disorder, current episode depressed, mild: Secondary | ICD-10-CM | POA: Diagnosis not present

## 2024-01-26 DIAGNOSIS — Z1211 Encounter for screening for malignant neoplasm of colon: Secondary | ICD-10-CM | POA: Diagnosis not present

## 2024-01-26 MED ORDER — SIMVASTATIN 40 MG PO TABS: ORAL_TABLET | ORAL | 1 refills | Status: AC

## 2024-01-26 MED ORDER — TAMSULOSIN HCL 0.4 MG PO CAPS: 0.4000 mg | ORAL_CAPSULE | Freq: Every day | ORAL | 1 refills | Status: AC

## 2024-01-26 MED ORDER — LISINOPRIL-HYDROCHLOROTHIAZIDE 20-12.5 MG PO TABS: 2.0000 | ORAL_TABLET | Freq: Every day | ORAL | 1 refills | Status: AC

## 2024-01-26 MED ORDER — PAROXETINE HCL 20 MG PO TABS: 20.0000 mg | ORAL_TABLET | Freq: Every day | ORAL | 1 refills | Status: AC

## 2024-01-26 NOTE — Progress Notes (Signed)
 "  BP 138/80   Pulse 67   Temp (!) 97 F (36.1 C)   Ht 5' 10 (1.778 m)   Wt 206 lb (93.4 kg)   SpO2 96%   BMI 29.56 kg/m    Subjective:    Patient ID: Tyler LELON Joshua Chrystal., male    DOB: 18-Feb-1960, 64 y.o.   MRN: [id number]  HPI: Tyler Pilson. is a 64 y.o. male  Chief Complaint  Patient presents with   Medical Management of Chronic Issues   Discussed the use of AI scribe software for clinical note transcription with the patient, who gave verbal consent to proceed.  History of Present Illness Tyler Skowronek. is a 64 year old male with hypertension who presents for a hypertension check.  Hypertension - Blood pressure at last appointment was 185/85 mmHg - Antihypertensive regimen adjusted to lisinopril -hydrochlorothiazide  20-12.5 mg, two tablets daily - Improvement in blood pressure since medication adjustment, but not yet at previous target levels  Chronic obstructive pulmonary disease (copd) and respiratory symptoms - Discontinued albuterol , Duoneb, and Symbicort  - Breathing has been stable without use of inhalers - No respiratory complaints  Thyroid  function and symptoms - Currently taking levothyroxine  100 mcg daily - No symptoms related to hypothyroidism except for awareness of heartbeat  Benign prostatic hyperplasia and nocturia - Currently taking Flomax  0.4 mg daily for BPH with nocturia  Prediabetes - Last hemoglobin A1c was 6.2, consistent with prediabetes - Due for A1c recheck today  Hyperlipidemia - Currently taking simvastatin  40 mg daily  Bipolar disorder - Currently taking Paxil  20 mg daily  Alpha-1 antitrypsin deficiency - History of alpha-1 antitrypsin deficiency         12/15/2023   11:13 AM 10/21/2023    9:44 AM 06/18/2023    9:13 AM  Depression screen PHQ 2/9  Decreased Interest 0 0 2  Down, Depressed, Hopeless 0 0 0  PHQ - 2 Score 0 0 2  Altered sleeping 0 0 1  Tired, decreased energy 0 0 1  Change in appetite 0 0 0   Feeling bad or failure about yourself  0 0 0  Trouble concentrating 0 0 1  Moving slowly or fidgety/restless 0 0 0  Suicidal thoughts 0 0 0  PHQ-9 Score 0 0  5   Difficult doing work/chores Not difficult at all Not difficult at all Somewhat difficult     Data saved with a previous flowsheet row definition    Relevant past medical, surgical, family and social history reviewed and updated as indicated. Interim medical history since our last visit reviewed. Allergies and medications reviewed and updated.  Review of Systems  Constitutional: Negative for fever or weight change.  Respiratory: Negative for cough and shortness of breath.   Cardiovascular: Negative for chest pain or palpitations.  Gastrointestinal: Negative for abdominal pain, no bowel changes.  Musculoskeletal: Negative for gait problem or joint swelling.  Skin: Negative for rash.  Neurological: Negative for dizziness or headache.  No other specific complaints in a complete review of systems (except as listed in HPI above).      Objective:      BP 138/80   Pulse 67   Temp (!) 97 F (36.1 C)   Ht 5' 10 (1.778 m)   Wt 206 lb (93.4 kg)   SpO2 96%   BMI 29.56 kg/m    Wt Readings from Last 3 Encounters:  01/26/24 206 lb (93.4 kg)  12/15/23 202 lb 12.8 oz (92 kg)  10/21/23 204 lb 11.2 oz (92.9 kg)    Physical Exam VITALS: BP- 130/80 GENERAL: Alert, cooperative, well developed, no acute distress. HEENT: Normocephalic, normal oropharynx, moist mucous membranes. CHEST: Clear to auscultation bilaterally, no wheezes, rhonchi, or crackles. CARDIOVASCULAR: Normal heart rate and rhythm, S1 and S2 normal without murmurs. ABDOMEN: Soft, non-tender, non-distended, without organomegaly, normal bowel sounds. EXTREMITIES: No cyanosis or edema. NEUROLOGICAL: Cranial nerves grossly intact, moves all extremities without gross motor or sensory deficit.  Results for orders placed or performed in visit on 10/21/23  POCT  urinalysis dipstick   Collection Time: 10/21/23 10:13 AM  Result Value Ref Range   Color, UA Straw    Clarity, UA Clear    Glucose, UA Negative Negative   Bilirubin, UA Negative    Ketones, UA Negative    Spec Grav, UA 1.010 1.010 - 1.025   Blood, UA Small    pH, UA 5.0 5.0 - 8.0   Protein, UA Negative Negative   Urobilinogen, UA 0.2 0.2 or 1.0 E.U./dL   Nitrite, UA Negative    Leukocytes, UA Trace (A) Negative   Appearance yellow    Odor Normal   Urine Culture   Collection Time: 10/21/23 10:38 AM   Specimen: Urine  Result Value Ref Range   MICRO NUMBER: [id number]    SPECIMEN QUALITY: Adequate    Sample Source URINE    STATUS: FINAL    Result: No Growth   PSA   Collection Time: 10/21/23 10:38 AM  Result Value Ref Range   PSA 1.20 < OR = 4.00 ng/mL          Assessment & Plan:   Problem List Items Addressed This Visit       Cardiovascular and Mediastinum   Hypertension - Primary (Chronic)   Relevant Medications   lisinopril -hydrochlorothiazide  (ZESTORETIC ) 20-12.5 MG tablet   simvastatin  (ZOCOR ) 40 MG tablet   Other Relevant Orders   CBC with Differential/Platelet   Comprehensive metabolic panel with GFR     Respiratory   AAT (alpha-1-antitrypsin) deficiency (HCC) (Chronic)   Chronic obstructive pulmonary disease (HCC)     Endocrine   Hypothyroidism (Chronic)   Relevant Orders   TSH     Other   Hyperlipidemia (Chronic)   Relevant Medications   lisinopril -hydrochlorothiazide  (ZESTORETIC ) 20-12.5 MG tablet   simvastatin  (ZOCOR ) 40 MG tablet   Other Relevant Orders   Lipid panel   Bipolar disorder (HCC) (Chronic)   Relevant Medications   PARoxetine  (PAXIL ) 20 MG tablet   Agoraphobia with panic attacks   Relevant Medications   PARoxetine  (PAXIL ) 20 MG tablet   BPH associated with nocturia   Relevant Medications   tamsulosin  (FLOMAX ) 0.4 MG CAPS capsule   Other Visit Diagnoses       Screening for diabetes mellitus       Relevant Orders    Comprehensive metabolic panel with GFR   Hemoglobin A1c     Screening for colon cancer       Relevant Orders   Ambulatory referral to Gastroenterology     Essential hypertension       Relevant Medications   lisinopril -hydrochlorothiazide  (ZESTORETIC ) 20-12.5 MG tablet   simvastatin  (ZOCOR ) 40 MG tablet        Assessment and Plan Assessment & Plan Essential hypertension Blood pressure improved to 130/80 mmHg with lisinopril -hydrochlorothiazide  20-12.5 mg two tablets daily, but not at optimal levels. - Continue lisinopril -hydrochlorothiazide  20-12.5 mg two tablets daily.  Hypothyroidism Managed with levothyroxine  100 mcg daily. No symptoms reported,  but thyroid  function needs reassessment. - Checked thyroid  function tests.  Chronic obstructive pulmonary disease No longer using albuterol , Duoneb, or Symbicort . Reports good breathing without medication.  Benign prostatic hyperplasia with lower urinary tract symptoms Managed with Flomax  0.4 mg daily. - Continue Flomax  0.4 mg daily.  Screening for diabetes mellitus Last A1c was 6.2%, indicating prediabetes. Reassessment needed. - Rechecked A1c.  Screening for colon cancer Overdue for colonoscopy.        Follow up plan: Return in about 6 months (around 07/25/2024) for follow up. "

## 2024-01-27 ENCOUNTER — Ambulatory Visit: Payer: Self-pay | Admitting: Nurse Practitioner

## 2024-01-27 DIAGNOSIS — D75839 Thrombocytosis, unspecified: Secondary | ICD-10-CM

## 2024-01-27 DIAGNOSIS — E039 Hypothyroidism, unspecified: Secondary | ICD-10-CM

## 2024-01-27 LAB — CBC WITH DIFFERENTIAL/PLATELET
Absolute Lymphocytes: 2100 {cells}/uL (ref 850–3900)
Absolute Monocytes: 714 {cells}/uL (ref 200–950)
Basophils Absolute: 105 {cells}/uL (ref 0–200)
Basophils Relative: 1 %
Eosinophils Absolute: 336 {cells}/uL (ref 15–500)
Eosinophils Relative: 3.2 %
HCT: 53 % — ABNORMAL HIGH (ref 39.4–51.1)
Hemoglobin: 17.6 g/dL — ABNORMAL HIGH (ref 13.2–17.1)
MCH: 28.6 pg (ref 27.0–33.0)
MCHC: 33.2 g/dL (ref 31.6–35.4)
MCV: 86 fL (ref 81.4–101.7)
MPV: 9.8 fL (ref 7.5–12.5)
Monocytes Relative: 6.8 %
Neutro Abs: 7245 {cells}/uL (ref 1500–7800)
Neutrophils Relative %: 69 %
Platelets: 491 Thousand/uL — ABNORMAL HIGH (ref 140–400)
RBC: 6.16 Million/uL — ABNORMAL HIGH (ref 4.20–5.80)
RDW: 13.5 % (ref 11.0–15.0)
Total Lymphocyte: 20 %
WBC: 10.5 Thousand/uL (ref 3.8–10.8)

## 2024-01-27 LAB — TSH: TSH: 5.23 m[IU]/L — ABNORMAL HIGH (ref 0.40–4.50)

## 2024-01-27 LAB — COMPREHENSIVE METABOLIC PANEL WITH GFR
AG Ratio: 1.3 (calc) (ref 1.0–2.5)
ALT: 18 U/L (ref 9–46)
AST: 19 U/L (ref 10–35)
Albumin: 4.4 g/dL (ref 3.6–5.1)
Alkaline phosphatase (APISO): 111 U/L (ref 35–144)
BUN: 19 mg/dL (ref 7–25)
CO2: 30 mmol/L (ref 20–32)
Calcium: 9.8 mg/dL (ref 8.6–10.3)
Chloride: 96 mmol/L — ABNORMAL LOW (ref 98–110)
Creat: 1.02 mg/dL (ref 0.70–1.35)
Globulin: 3.3 g/dL (ref 1.9–3.7)
Glucose, Bld: 101 mg/dL — ABNORMAL HIGH (ref 65–99)
Potassium: 4.3 mmol/L (ref 3.5–5.3)
Sodium: 135 mmol/L (ref 135–146)
Total Bilirubin: 0.4 mg/dL (ref 0.2–1.2)
Total Protein: 7.7 g/dL (ref 6.1–8.1)
eGFR: 82 mL/min/1.73m2

## 2024-01-27 LAB — HEMOGLOBIN A1C
Hgb A1c MFr Bld: 6 % — ABNORMAL HIGH
Mean Plasma Glucose: 126 mg/dL
eAG (mmol/L): 7 mmol/L

## 2024-01-27 LAB — LIPID PANEL
Cholesterol: 130 mg/dL
HDL: 36 mg/dL — ABNORMAL LOW
LDL Cholesterol (Calc): 74 mg/dL
Non-HDL Cholesterol (Calc): 94 mg/dL
Total CHOL/HDL Ratio: 3.6 (calc)
Triglycerides: 110 mg/dL

## 2024-01-31 ENCOUNTER — Other Ambulatory Visit: Payer: Self-pay | Admitting: Nurse Practitioner

## 2024-01-31 DIAGNOSIS — E039 Hypothyroidism, unspecified: Secondary | ICD-10-CM

## 2024-01-31 MED ORDER — LEVOTHYROXINE SODIUM 25 MCG PO TABS: 25.0000 ug | ORAL_TABLET | ORAL | 0 refills | Status: AC

## 2024-07-25 ENCOUNTER — Ambulatory Visit: Admitting: Nurse Practitioner
